# Patient Record
Sex: Female | Born: 1995 | Race: White | Hispanic: No | Marital: Married | Smoking: Never smoker
Health system: Southern US, Community
[De-identification: ages and names within clinical notes are randomized; demographics above are authoritative.]

## PROBLEM LIST (undated history)

## (undated) ENCOUNTER — Inpatient Hospital Stay: Payer: Self-pay

## (undated) DIAGNOSIS — F419 Anxiety disorder, unspecified: Secondary | ICD-10-CM

## (undated) DIAGNOSIS — R011 Cardiac murmur, unspecified: Secondary | ICD-10-CM

## (undated) DIAGNOSIS — K219 Gastro-esophageal reflux disease without esophagitis: Secondary | ICD-10-CM

## (undated) DIAGNOSIS — J189 Pneumonia, unspecified organism: Secondary | ICD-10-CM

## (undated) DIAGNOSIS — J45909 Unspecified asthma, uncomplicated: Secondary | ICD-10-CM

## (undated) DIAGNOSIS — Z7251 High risk heterosexual behavior: Secondary | ICD-10-CM

## (undated) HISTORY — PX: MANDIBLE FRACTURE SURGERY: SHX706

## (undated) HISTORY — DX: Anxiety disorder, unspecified: F41.9

## (undated) HISTORY — DX: Unspecified asthma, uncomplicated: J45.909

## (undated) HISTORY — DX: Gastro-esophageal reflux disease without esophagitis: K21.9

## (undated) HISTORY — PX: MANDIBLE SURGERY: SHX707

## (undated) HISTORY — DX: High risk heterosexual behavior: Z72.51

## (undated) HISTORY — PX: WISDOM TOOTH EXTRACTION: SHX21

---
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
## 2017-01-29 ENCOUNTER — Encounter: Payer: Self-pay | Admitting: Certified Nurse Midwife

## 2017-01-29 ENCOUNTER — Ambulatory Visit: Payer: BLUE CROSS/BLUE SHIELD | Admitting: Certified Nurse Midwife

## 2017-01-29 VITALS — BP 112/72 | HR 71 | Ht 68.0 in | Wt 229.9 lb

## 2017-01-29 DIAGNOSIS — Z30011 Encounter for initial prescription of contraceptive pills: Secondary | ICD-10-CM

## 2017-01-29 LAB — POCT URINE PREGNANCY: PREG TEST UR: NEGATIVE

## 2017-01-29 MED ORDER — NORETHIN-ETH ESTRAD-FE BIPHAS 1 MG-10 MCG / 10 MCG PO TABS: 1.0000 | ORAL_TABLET | Freq: Every day | ORAL | 11 refills | Status: DC

## 2017-01-29 NOTE — Patient Instructions (Signed)

## 2017-01-29 NOTE — Progress Notes (Signed)
Subjective:    Ann Deleon is a 21 y.o. female who presents for contraception counseling. The patient has no complaints today. The patient is sexually active. Pertinent past medical history: none.  Menstrual History: OB History    No data available      Patient's last menstrual period was 01/07/2017 (exact date).    The following portions of the patient's history were reviewed and updated as appropriate: allergies, current medications, past family history, past medical history, past social history, past surgical history and problem list.  Review of Systems Constitutional: negative Eyes: negative Ears, nose, mouth, throat, and face: negative Respiratory: negative Cardiovascular: negative Gastrointestinal: negative Genitourinary:negative Integument/breast: negative Hematologic/lymphatic: negative Musculoskeletal:negative Neurological: negative   Objective:    No exam performed today, not indicated.   Assessment:    21 y.o., starting OCP (estrogen/progesterone), no contraindications.   Plan:  Reviewed all options for birth control. She is interested in starting the pill. She has taken it in the past.  All questions answered. Follow up as needed.  Sample of lo loestrin given, Instructions given, prescription placed.   Ann BurkeAnnie Giavonni Deleon, CNM

## 2017-03-17 ENCOUNTER — Ambulatory Visit: Payer: BLUE CROSS/BLUE SHIELD | Admitting: Family Medicine

## 2017-03-17 ENCOUNTER — Encounter: Payer: Self-pay | Admitting: Family Medicine

## 2017-03-17 VITALS — BP 138/87 | HR 79 | Temp 98.3°F | Resp 16 | Ht 68.0 in | Wt 236.0 lb

## 2017-03-17 DIAGNOSIS — N3001 Acute cystitis with hematuria: Secondary | ICD-10-CM | POA: Diagnosis not present

## 2017-03-17 LAB — POCT URINALYSIS DIPSTICK
BILIRUBIN UA: NEGATIVE
GLUCOSE UA: NEGATIVE
KETONES UA: NEGATIVE
Nitrite, UA: NEGATIVE
Protein, UA: NEGATIVE
SPEC GRAV UA: 1.01 (ref 1.010–1.025)
Urobilinogen, UA: 0.2 E.U./dL
pH, UA: 7 (ref 5.0–8.0)

## 2017-03-17 MED ORDER — CEPHALEXIN 500 MG PO CAPS: 500.0000 mg | ORAL_CAPSULE | Freq: Three times a day (TID) | ORAL | 0 refills | Status: DC

## 2017-03-17 NOTE — Progress Notes (Signed)
Subjective:    Patient ID: Ann Deleon, female    DOB: May 27, 1995, 22 y.o.   MRN: [id number]  Ann Deleon is a 22 y.o. female presenting on 03/17/2017 for Urinary Tract Infection (onset 3 days )  Patient presents for a same day appointment.  HPI   UTI Reports symptoms started 3 days ago with initial lower pelvic discomfort and pain with associated urinary frequency and urge to go but reduced urination amount. Some burning but mostly discomfort pelvic discomfort. Last UTI 1-2 years ago, has had prior BV in past, treated with Flagyl Last menstrual cycle about 2-3 weeks ago, earlier in month. She just started new OCP per [facility] last seen on 01/29/17. She is sexually active with boyfriend. She denies any exposure to STD and declines screening. She has not missed her period and declines urine pregnancy testing today. Admits some loose stool Admits mild vaginal discharge Denies any fevers/chills, sweats, nausea vomiting, back or flank pain  Depression screen [facility] 2/9 12/16/2016 06/15/2016 07/19/2014  Decreased Interest 0 0 0  Down, Depressed, Hopeless 0 0 0  PHQ - 2 Score 0 0 0  Altered sleeping 0 2 -  Tired, decreased energy 0 2 -  Change in appetite 0 2 -  Feeling bad or failure about yourself  0 0 -  Trouble concentrating 0 - -  Moving slowly or fidgety/restless 0 2 -  Suicidal thoughts 0 0 -  PHQ-9 Score 0 8 -  Difficult doing work/chores Not difficult at all - -    Social History   Tobacco Use  . Smoking status: Never Smoker  . Smokeless tobacco: Never Used  Substance Use Topics  . Alcohol use: No  . Drug use: No    Review of Systems Per HPI unless specifically indicated above     Objective:    BP 138/87   Pulse 79   Temp 98.3 F (36.8 C) (Oral)   Resp 16   Ht 5\' 8"  (1.727 m)   Wt 236 lb (107 kg)   BMI 35.88 kg/m   Wt Readings from Last 3 Encounters:  03/17/17 236 lb (107 kg)  01/29/17 229 lb 14.4 oz (104.3 kg) (99 %, Z= 2.32)*  12/16/16 225 lb  (102.1 kg) (99 %, Z= 2.27)*   * Growth percentiles are based on CDC (Girls, 2-20 Years) data.    Physical Exam  Constitutional: She is oriented to person, place, and time. She appears well-developed and well-nourished. No distress.  Well-appearing, comfortable, cooperative  HENT:  Head: Normocephalic and atraumatic.  Mouth/Throat: Oropharynx is clear and moist.  Eyes: Conjunctivae are normal. Right eye exhibits no discharge. Left eye exhibits no discharge.  Cardiovascular: Normal rate.  Pulmonary/Chest: Effort normal.  Abdominal: Soft. Bowel sounds are normal. She exhibits no distension and no mass. There is tenderness (mild lower midline abdominal / pelvic discomfort on palpation).  Musculoskeletal: She exhibits no edema.  Neurological: She is alert and oriented to person, place, and time.  Skin: Skin is warm and dry. No rash noted. She is not diaphoretic. No erythema.  Psychiatric: She has a normal mood and affect. Her behavior is normal.  Well groomed, good eye contact, normal speech and thoughts  Nursing note and vitals reviewed.  Results for orders placed or performed in visit on 03/17/17  POCT Urinalysis Dipstick  Result Value Ref Range   Color, UA amber    Clarity, UA clear    Glucose, UA negative    Bilirubin, UA  negative    Ketones, UA negative    Spec Grav, UA 1.010 1.010 - 1.025   Blood, UA large    pH, UA 7.0 5.0 - 8.0   Protein, UA negative    Urobilinogen, UA 0.2 0.2 or 1.0 E.U./dL   Nitrite, UA negative    Leukocytes, UA Trace (A) Negative   Appearance clear    Odor none       Assessment & Plan:   Problem List Items Addressed This Visit    None    Visit Diagnoses    Acute cystitis with hematuria    -  Primary  Clinically consistent with UTI and supported by UA. No recent UTIs or abx courses. Prior UTI similar. She has no history of nephrolithiasis and this clinically doesn't seem consistent. No concern for pyelo today (no systemic symptoms, neg fever,  back pain, n/v). Considered other options with pelvic etiology - new OCP start >1-2 months ago, could be menstrual related, has not missed period, offered Upreg but patient declined.  Plan: 1. Reviewed UA 2. Ordered Urine culture 3. Empiric Keflex 500mg  TID x 7 days 4. Improve PO hydration 5. RTC if no improvement 1-2 weeks, red flags given to return sooner - next should follow-up with GYN if not improved as advised     Relevant Medications   cephALEXin (KEFLEX) 500 MG capsule   Other Relevant Orders   POCT Urinalysis Dipstick (Completed)   CULTURE, URINE COMPREHENSIVE      Meds ordered this encounter  Medications  . cephALEXin (KEFLEX) 500 MG capsule    Sig: Take 1 capsule (500 mg total) by mouth 3 (three) times daily. For 7 days    Dispense:  21 capsule    Refill:  0    Follow up plan: Return if symptoms worsen or fail to improve, for UTI.  Saralyn PilarAlexander Depaul Arizpe, DO [facility] 03/17/2017, 9:56 AM

## 2017-03-17 NOTE — Patient Instructions (Addendum)
Thank you for coming to the office today.  1. You have a Urinary Tract Infection - this is very common, your symptoms are reassuring and you should get better within 1 week on the antibiotics - Start Keflex 500mg  3 times daily for next 7 days, complete entire course, even if feeling better - We sent urine for a culture, we will call you within next few days if we need to change antibiotics - Please drink plenty of fluids, improve hydration over next 1 week  If symptoms worsening, developing nausea / vomiting, worsening back pain, fevers / chills / sweats, then please return for re-evaluation sooner.  To prevent bladder and kidney infections...  1. Pee within 30 minutes after sex  2. Wipe front to back after using the restroom  3. Drink enough water to keep your pee clear to pale yellow  If you think you are getting another bladder infection, start drinking cranberry juice and come see us so we can check the urine.  If not improved can contact [facility] for next step, may need pelvic exam and swab testing - also could be related to birth control, but seems less likely  Please schedule a Follow-up Appointment to: Return if symptoms worsen or fail to improve, for UTI.   If you have any other questions or concerns, please feel free to call the office or send a message through MyChart. You may also schedule an earlier appointment if necessary.  Additionally, you may be receiving a survey about your experience at our office within a few days to 1 week by e-mail or mail. We value your feedback.  Saralyn PilarAlexander Karamalegos, DO [facility]

## 2017-03-18 LAB — CULTURE, URINE COMPREHENSIVE
MICRO NUMBER:: 90129420
SPECIMEN QUALITY: ADEQUATE

## 2017-03-22 ENCOUNTER — Encounter: Payer: Self-pay | Admitting: *Deleted

## 2017-03-22 ENCOUNTER — Ambulatory Visit
Admission: EM | Admit: 2017-03-22 | Discharge: 2017-03-22 | Disposition: A | Discharge status: Home / Self Care | Payer: BLUE CROSS/BLUE SHIELD | Attending: Family Medicine | Admitting: Family Medicine

## 2017-03-22 DIAGNOSIS — N3000 Acute cystitis without hematuria: Secondary | ICD-10-CM

## 2017-03-22 LAB — URINALYSIS, COMPLETE (UACMP) WITH MICROSCOPIC
BILIRUBIN URINE: NEGATIVE
Glucose, UA: NEGATIVE mg/dL
Ketones, ur: NEGATIVE mg/dL
Nitrite: NEGATIVE
PROTEIN: NEGATIVE mg/dL
SPECIFIC GRAVITY, URINE: 1.01 (ref 1.005–1.030)
pH: 6.5 (ref 5.0–8.0)

## 2017-03-22 MED ORDER — NITROFURANTOIN MONOHYD MACRO 100 MG PO CAPS
100.0000 mg | ORAL_CAPSULE | Freq: Two times a day (BID) | ORAL | 0 refills | Status: DC
Start: 2017-03-22 — End: 2017-10-27

## 2017-03-22 NOTE — ED Provider Notes (Signed)
[facility]    CSN: [id number] Arrival date & time: 03/22/17  0847     History   Chief Complaint Chief Complaint  Patient presents with  . Flank Pain  . Dysuria    HPI Ann Deleon is a 22 y.o. female.   The history is provided by the patient.  Flank Pain  Pertinent negatives include no abdominal pain.  Dysuria  Pain quality:  Burning Pain severity:  Moderate Onset quality:  Sudden Duration:  1 week Timing:  Constant Progression:  Worsening Chronicity:  New Recent urinary tract infections: yes   Ineffective treatments:  Antibiotics (seen last week and prescribed keflex bid; not improving) Associated symptoms: nausea and vomiting (once this morning)   Associated symptoms: no abdominal pain, no fever, no flank pain, no genital lesions and no vaginal discharge   Associated symptoms comment:  Low back pain Risk factors: recurrent urinary tract infections   Risk factors: no hx of pyelonephritis, no hx of urolithiasis, no kidney transplant, not pregnant, no renal cysts, no renal disease, not sexually active, no sexually transmitted infections, no single kidney and no urinary catheter     Past Medical History:  Diagnosis Date  . Anxiety   . Asthma   . GERD (gastroesophageal reflux disease)   . Sexually active at young age     Patient Active Problem List   Diagnosis Date Noted  . GAD (generalized anxiety disorder) 12/16/2016  . Obesity (BMI 30.0-34.9) 12/16/2016  . Contraception management 06/15/2016  . Mild episode of recurrent major depressive disorder (HCC) 06/15/2016  . Concussion with no loss of consciousness 05/05/2016  . Contusion of other part of head 05/05/2016  . 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 Headache 01/14/2016  . Fatigue 11/19/2015  . Allergic rhinitis due to pollen 04/12/2015  . Asthma, mild persistent 07/06/2014  . Dermatitis, eczematoid 07/06/2014  . Dysmenorrhea 07/06/2014  . Acid reflux 07/06/2014  . Anxiety 07/06/2014     Past Surgical History:  Procedure Laterality Date  . MANDIBLE SURGERY      OB History    Gravida Para Term Preterm AB Living   0 0 0 0 0 0   SAB TAB Ectopic Multiple Live Births   0 0 0 0 0       Home Medications    Prior to Admission medications   Medication Sig Start Date End Date Taking? Authorizing Provider  cephALEXin (KEFLEX) 500 MG capsule Take 1 capsule (500 mg total) by mouth 3 (three) times daily. For 7 days 03/17/17  Yes Karamalegos, Netta Neat, DO  Norethindrone-Ethinyl Estradiol-Fe Biphas (LO LOESTRIN FE) 1 MG-10 MCG / 10 MCG tablet Take 1 tablet by mouth daily. 01/29/17  Yes Doreene Burke, CNM  albuterol (PROVENTIL HFA;VENTOLIN HFA) 108 (90 BASE) MCG/ACT inhaler Inhale 2 puffs into the lungs 4 (four) times daily as needed. 06/05/13   Loura Pardon, NP  albuterol (PROVENTIL) (2.5 MG/3ML) 0.083% nebulizer solution Take 3 mLs (2.5 mg total) by nebulization every 6 (six) hours as needed for wheezing or shortness of breath. 02/28/16   Karamalegos, Netta Neat, DO  escitalopram (LEXAPRO) 10 MG tablet Take 1 tablet (10 mg total) by mouth daily. 12/16/16   Karamalegos, Alexander J, DO  fluticasone (FLOVENT HFA) 110 MCG/ACT inhaler Inhale 1 puff into the lungs 2 (two) times daily. 05/28/15   Krebs, Laurel Dimmer, NP  nitrofurantoin, macrocrystal-monohydrate, (MACROBID) 100 MG capsule Take 1 capsule (100 mg total) by mouth 2 (two) times daily. 03/22/17   Giovannie Scerbo,  Pamala Hurry, MD  Spacer/Aero-Holding Chambers (AEROCHAMBER PLUS WITH MASK) inhaler Use as instructed 10/29/15   Smitty Cords, DO    Family History Family History  Problem Relation Age of Onset  . Depression Mother   . Anxiety disorder Mother   . Diabetes Father   . Nephrolithiasis Father   . Depression Father     Social History Social History   Tobacco Use  . Smoking status: Never Smoker  . Smokeless tobacco: Never Used  Substance Use Topics  . Alcohol use: No  . Drug use: No     Allergies    Patient has no known allergies.   Review of Systems Review of Systems  Constitutional: Negative for fever.  Gastrointestinal: Positive for nausea and vomiting (once this morning). Negative for abdominal pain.  Genitourinary: Positive for dysuria. Negative for flank pain and vaginal discharge.     Physical Exam Triage Vital Signs ED Triage Vitals  Enc Vitals Group     BP 03/22/17 0904 (!) 148/91     Pulse Rate 03/22/17 0904 97     Resp 03/22/17 0904 16     Temp 03/22/17 0904 98.3 F (36.8 C)     Temp Source 03/22/17 0904 Oral     SpO2 03/22/17 0904 98 %     Weight 03/22/17 0906 240 lb (108.9 kg)     Height 03/22/17 0906 5\' 9"  (1.753 m)     Head Circumference --      Peak Flow --      Pain Score --      Pain Loc --      Pain Edu? --      Excl. in GC? --    No data found.  Updated Vital Signs BP (!) 148/91 (BP Location: Left Arm)   Pulse 97   Temp 98.3 F (36.8 C) (Oral)   Resp 16   Ht 5\' 9"  (1.753 m)   Wt 240 lb (108.9 kg)   SpO2 98%   BMI 35.44 kg/m   Visual Acuity Right Eye Distance:   Left Eye Distance:   Bilateral Distance:    Right Eye Near:   Left Eye Near:    Bilateral Near:     Physical Exam  Constitutional: She appears well-developed and well-nourished. No distress.  Abdominal: Soft. Bowel sounds are normal. She exhibits no distension and no mass. There is tenderness (mild, suprapubic). There is no rebound and no guarding.  Skin: She is not diaphoretic.  Nursing note and vitals reviewed.    UC Treatments / Results  Labs (all labs ordered are listed, but only abnormal results are displayed) Labs Reviewed  URINALYSIS, COMPLETE (UACMP) WITH MICROSCOPIC - Abnormal; Notable for the following components:      Result Value   Color, Urine STRAW (*)    APPearance HAZY (*)    Hgb urine dipstick MODERATE (*)    Leukocytes, UA LARGE (*)    Squamous Epithelial / LPF 6-30 (*)    Bacteria, UA RARE (*)    All other components within normal limits   URINE CULTURE    EKG  EKG Interpretation None       Radiology No results found.  Procedures Procedures (including critical care time)  Medications Ordered in UC Medications - No data to display   Initial Impression / Assessment and Plan / UC Course  I have reviewed the triage vital signs and the nursing notes.  Pertinent labs & imaging results that were available during my care of  the patient were reviewed by me and considered in my medical decision making (see chart for details).       Final Clinical Impressions(s) / UC Diagnoses   Final diagnoses:  Acute cystitis without hematuria    ED Discharge Orders        Ordered    nitrofurantoin, macrocrystal-monohydrate, (MACROBID) 100 MG capsule  2 times daily     03/22/17 1006     1. Lab results and diagnosis reviewed with patient 2. rx as per orders above; reviewed possible side effects, interactions, risks and benefits  3. Recommend supportive treatment with fluids, otc analgesics prn 4. Follow-up prn if symptoms worsen or don't improve  Controlled Substance Prescriptions [place] Controlled Substance Registry consulted? Not Applicable   Payton Mccallumonty, Marquiz Sotelo, MD 03/22/17 1014

## 2017-03-22 NOTE — ED Triage Notes (Signed)
Seen last week in [place] for UTI symptoms and rx Cephalexin 500mg , has 2 days left on that rx. Here today with bilat flank pain, N/V, and continuing UTI symptoms.

## 2017-03-23 LAB — URINE CULTURE
CULTURE: NO GROWTH
SPECIAL REQUESTS: NORMAL

## 2017-10-27 ENCOUNTER — Ambulatory Visit (INDEPENDENT_AMBULATORY_CARE_PROVIDER_SITE_OTHER): Payer: BLUE CROSS/BLUE SHIELD | Admitting: Nurse Practitioner

## 2017-10-27 ENCOUNTER — Encounter: Payer: Self-pay | Admitting: Nurse Practitioner

## 2017-10-27 ENCOUNTER — Other Ambulatory Visit: Payer: Self-pay

## 2017-10-27 VITALS — BP 124/76 | HR 79 | Temp 98.5°F | Ht 69.0 in | Wt 236.0 lb

## 2017-10-27 DIAGNOSIS — M545 Low back pain, unspecified: Secondary | ICD-10-CM

## 2017-10-27 DIAGNOSIS — G43A1 Cyclical vomiting, intractable: Secondary | ICD-10-CM

## 2017-10-27 DIAGNOSIS — R1115 Cyclical vomiting syndrome unrelated to migraine: Secondary | ICD-10-CM

## 2017-10-27 DIAGNOSIS — N3001 Acute cystitis with hematuria: Secondary | ICD-10-CM | POA: Diagnosis not present

## 2017-10-27 LAB — POCT URINALYSIS DIPSTICK
Bilirubin, UA: NEGATIVE
Glucose, UA: NEGATIVE
Ketones, UA: NEGATIVE
Nitrite, UA: NEGATIVE
Protein, UA: NEGATIVE
Spec Grav, UA: 1.01 (ref 1.010–1.025)
Urobilinogen, UA: 0.2 E.U./dL
pH, UA: 5 (ref 5.0–8.0)

## 2017-10-27 MED ORDER — CEPHALEXIN 500 MG PO CAPS: 500.0000 mg | ORAL_CAPSULE | Freq: Three times a day (TID) | ORAL | 0 refills | Status: AC

## 2017-10-27 MED ORDER — ONDANSETRON HCL 4 MG PO TABS: 4.0000 mg | ORAL_TABLET | Freq: Three times a day (TID) | ORAL | 0 refills | Status: DC | PRN

## 2017-10-27 NOTE — Progress Notes (Signed)
Subjective:    Patient ID: Ann Deleon, female    DOB: May 31, 1995, 22 y.o.   MRN: [id number]  Ann Deleon is a 22 y.o. female presenting on 10/27/2017 for Nausea (vomiting, diarrhea, throbbing low back pain that radiates around the stomach x 12 hrs. Pt takingf Pepto Bismol, Dramamine, and tylenol  )   HPI  Nausea and Vomiting Onset of symptoms of nausea and vomiting began 1 day ago.  Patient was at work and started having R sided back pain.  Has eaten [employer] yesterday, which has made her sick before. Has had [employer] since getting sick before without problems.  She didn't feel well (nausea) after eating and vomited at work around 3 pm.  Has been vomiting about every 1 hour since then.   Yesterday, she took 2 Tylenol Extra Strength with some relief of back pain.  Relaxed some in a hot water bath.  Ate soup, but vomited. She was able to sleep, but wakes every 1 hour and has continued having increased back pain.  Took 2 more Tylenol and pepto bismol this morning without relief. - Stomach contents and liquids only last few times she has vomited.  Cannot keep down drinks or food.  Has kept about 3 oz water down in clinic today. - Denies fever, sweats, diarrhea.  Is having some chills.  - Denies pelvic pressure, increased urination, or dysuria.  Social History   Tobacco Use  . Smoking status: Never Smoker  . Smokeless tobacco: Never Used  Substance Use Topics  . Alcohol use: No  . Drug use: No    Review of Systems Per HPI unless specifically indicated above     Objective:    BP 124/76 (BP Location: Right Arm, Patient Position: Sitting, Cuff Size: Normal)   Pulse 79   Temp 98.5 F (36.9 C) (Oral)   Ht 5\' 9"  (1.753 m)   Wt 236 lb (107 kg)   LMP 10/13/2017   BMI 34.85 kg/m   Wt Readings from Last 3 Encounters:  10/27/17 236 lb (107 kg)  03/22/17 240 lb (108.9 kg)  03/17/17 236 lb (107 kg)    Physical Exam  Constitutional: She is oriented to person, place, and  time. Vital signs are normal. She appears well-developed and well-nourished. She is active. She has a sickly appearance. No distress.  HENT:  Head: Normocephalic and atraumatic.  Cardiovascular: Normal rate, regular rhythm, normal heart sounds and intact distal pulses.  Pulmonary/Chest: Effort normal and breath sounds normal. No respiratory distress.  Abdominal: Soft. Normal appearance and bowel sounds are normal. There is no hepatosplenomegaly. There is generalized tenderness. There is CVA tenderness. There is no rigidity, no rebound, no guarding, no tenderness at McBurney's point and negative Murphy's sign. No hernia.  Lymphadenopathy:    She has no cervical adenopathy.    She has no axillary adenopathy.       Right: No inguinal adenopathy present.       Left: No inguinal adenopathy present.  Neurological: She is alert and oriented to person, place, and time.  Skin: Skin is warm and dry. Capillary refill takes less than 2 seconds.  Psychiatric: She has a normal mood and affect. Her behavior is normal. Judgment and thought content normal.  Vitals reviewed.  Results for orders placed or performed in visit on 10/27/17  POCT urinalysis dipstick  Result Value Ref Range   Color, UA yellow    Clarity, UA clear    Glucose, UA Negative Negative  Bilirubin, UA negative    Ketones, UA negative    Spec Grav, UA 1.010 1.010 - 1.025   Blood, UA large    pH, UA 5.0 5.0 - 8.0   Protein, UA Negative Negative   Urobilinogen, UA 0.2 0.2 or 1.0 E.U./dL   Nitrite, UA negative    Leukocytes, UA Trace (A) Negative   Appearance     Odor        Assessment & Plan:   Problem List Items Addressed This Visit    None    Visit Diagnoses    Acute right-sided low back pain without sciatica    -  Primary   Relevant Orders   POCT urinalysis dipstick (Completed)   Intractable cyclical vomiting with nausea       Relevant Medications   ondansetron (ZOFRAN) 4 MG tablet   Other Relevant Orders   CBC with  Differential/Platelet   COMPLETE METABOLIC PANEL WITH GFR   Acute cystitis with hematuria       Relevant Medications   cephALEXin (KEFLEX) 500 MG capsule   Other Relevant Orders   Urine Culture      Acute infectious process, likely viral gastroenteritis vs pyelonephritis in presence of acute cystitis.  Exam today with leuks and blood on UA with pos L sided CVAT.   - Patient is x 18 hr symptoms diarrhea / vomiting. No known sick contacts. - Currently fatigued, but well appearing and non-toxic, clinically well hydrated on exam, benign abdomen.  Plan: 1. Reassurance, likely self-limited 7-10 days 2. Continue PO as tolerated. May try to increase hydration, clear fluids (gatorade, pedialyate, water) 3. Continue Tylenol. 4. Precaution given rx Zofran 4mg  ODT q 8 hr PRN 5. For acute cystitis - start Keflex 500 mg tid x 5 days.  Send urine culture 6. Labs today 7.Return criteria reviewed.  Meds ordered this encounter  Medications  . ondansetron (ZOFRAN) 4 MG tablet    Sig: Take 1 tablet (4 mg total) by mouth every 8 (eight) hours as needed for nausea or vomiting.    Dispense:  15 tablet    Refill:  0    Order Specific Question:   Supervising Provider    Answer:   Smitty Cords [[id number]]  . cephALEXin (KEFLEX) 500 MG capsule    Sig: Take 1 capsule (500 mg total) by mouth 3 (three) times daily for 5 days.    Dispense:  15 capsule    Refill:  0    Order Specific Question:   Supervising Provider    Answer:   Smitty Cords [[id number]]    Follow up plan: Return 2-3 days if symptoms worsen or fail to improve.  Wilhelmina Mcardle, DNP, AGPCNP-BC Adult Gerontology Primary Care Nurse Practitioner [facility] 10/27/2017, 10:15 AM

## 2017-10-27 NOTE — Patient Instructions (Addendum)
[address] [place],   Thank you for coming in to clinic today.  1. You most likely have a food related illness that is viral and will last up to 24-48 hours.  Your symptoms are likely from Viral Gastroenteritis. This should significantly improve within about 24-48 hours and full improvement in about 1 week. - It is important to continue hydration.  Drink at least 2 L water daily.  You may start drinking some gatorade or powerade if you are unable to eat enough food. - Given rx for Zofran 4 mg dissolving tablets/liquid - may use every 8 hours as needed for vomiting only if persistent - Continue Tylenol every 6 hours if needed for fevers or aches and pains.  If symptoms are worsening, decreased urine output (no urination within 12 hours), persistent fevers higher than 101F for more than 3 days, worsening or increased vomiting, persistent diarrhea after 5-7 days, decreased appetite, please call or return to clinic, may go to Urgent Care or ED.  2. If you have a urine infection we will also call over an antibiotic to your pharmacy.   Please schedule a follow-up appointment with Wilhelmina Mcardle, AGNP. Return 2-3 days if symptoms worsen or fail to improve.  If you have any other questions or concerns, please feel free to call the clinic or send a message through MyChart. You may also schedule an earlier appointment if necessary.  You will receive a survey after today's visit either digitally by e-mail or paper by [employer]. Your experiences and feedback matter to [place].  Please respond so we know how we are doing as we provide care for you.   Wilhelmina Mcardle, DNP, AGNP-BC Adult Gerontology Nurse Practitioner [facility], [facility]

## 2017-10-28 LAB — URINE CULTURE
MICRO NUMBER:: 91090391
Result:: NO GROWTH
SPECIMEN QUALITY:: ADEQUATE

## 2017-10-28 LAB — CBC WITH DIFFERENTIAL/PLATELET
Basophils Absolute: 47 cells/uL (ref 0–200)
Basophils Relative: 0.4 %
Eosinophils Absolute: 12 cells/uL — ABNORMAL LOW (ref 15–500)
Eosinophils Relative: 0.1 %
HCT: 40.7 % (ref 35.0–45.0)
Hemoglobin: 13.5 g/dL (ref 11.7–15.5)
Lymphs Abs: 1147 cells/uL (ref 850–3900)
MCH: 28.7 pg (ref 27.0–33.0)
MCHC: 33.2 g/dL (ref 32.0–36.0)
MCV: 86.6 fL (ref 80.0–100.0)
MPV: 12 fL (ref 7.5–12.5)
Monocytes Relative: 7.7 %
Neutro Abs: 9594 cells/uL — ABNORMAL HIGH (ref 1500–7800)
Neutrophils Relative %: 82 %
Platelets: 288 10*3/uL (ref 140–400)
RBC: 4.7 10*6/uL (ref 3.80–5.10)
RDW: 11.7 % (ref 11.0–15.0)
Total Lymphocyte: 9.8 %
WBC mixed population: 901 cells/uL (ref 200–950)
WBC: 11.7 10*3/uL — ABNORMAL HIGH (ref 3.8–10.8)

## 2017-10-28 LAB — COMPLETE METABOLIC PANEL WITH GFR
AG Ratio: 1.4 (calc) (ref 1.0–2.5)
ALT: 10 U/L (ref 6–29)
AST: 21 U/L (ref 10–30)
Albumin: 3.9 g/dL (ref 3.6–5.1)
Alkaline phosphatase (APISO): 82 U/L (ref 33–115)
BUN/Creatinine Ratio: 7 (calc) (ref 6–22)
BUN: 16 mg/dL (ref 7–25)
CO2: 25 mmol/L (ref 20–32)
Calcium: 9.4 mg/dL (ref 8.6–10.2)
Chloride: 108 mmol/L (ref 98–110)
Creat: 2.19 mg/dL — ABNORMAL HIGH (ref 0.50–1.10)
GFR, Est African American: 36 mL/min/{1.73_m2} — ABNORMAL LOW (ref >=60)
GFR, Est Non African American: 31 mL/min/{1.73_m2} — ABNORMAL LOW (ref >=60)
Globulin: 2.8 g/dL (calc) (ref 1.9–3.7)
Glucose, Bld: 84 mg/dL (ref 65–99)
Potassium: 4.3 mmol/L (ref 3.5–5.3)
Sodium: 141 mmol/L (ref 135–146)
Total Bilirubin: 0.4 mg/dL (ref 0.2–1.2)
Total Protein: 6.7 g/dL (ref 6.1–8.1)

## 2018-01-06 DIAGNOSIS — B001 Herpesviral vesicular dermatitis: Secondary | ICD-10-CM

## 2018-01-07 ENCOUNTER — Telehealth: Payer: Self-pay | Admitting: Family Medicine

## 2018-01-07 DIAGNOSIS — B001 Herpesviral vesicular dermatitis: Secondary | ICD-10-CM

## 2018-01-07 MED ORDER — VALACYCLOVIR HCL 1 G PO TABS: 1000.0000 mg | ORAL_TABLET | Freq: Two times a day (BID) | ORAL | 1 refills | Status: DC | PRN

## 2018-01-07 MED ORDER — ACYCLOVIR 5 % EX OINT: TOPICAL_OINTMENT | CUTANEOUS | 0 refills | Status: DC

## 2018-01-07 NOTE — Telephone Encounter (Signed)
.  mychart

## 2018-01-07 NOTE — Telephone Encounter (Signed)
Pt has fever blisters and asked to have a refill valacyclovir sent to [facility].  Her call back [phone]

## 2018-01-07 NOTE — Telephone Encounter (Signed)
Incoming call

## 2018-01-07 NOTE — Addendum Note (Signed)
Addended by: Smitty CordsKARAMALEGOS, Elvenia Godden J on: 01/07/2018 05:37 PM   Modules accepted: Orders

## 2018-01-07 NOTE — Telephone Encounter (Signed)
Mychart message

## 2018-01-07 NOTE — Telephone Encounter (Signed)
Previous rx was from 2017-2018. Re ordered now, Valtrex take 1 tab twice a day for 5-7 days as needed for flare.  Please notify patient that she will be required to come in for a Yearly Physical / Check up in next 3-6 months. Needs to be seen in 2020 for a routine checkup, or we will be unable to refill rx in the future.  Saralyn PilarAlexander Antawan Mchugh, DO [facility] 01/07/2018, 12:49 PM

## 2018-01-10 NOTE — Telephone Encounter (Signed)
Patient advised.

## 2018-04-11 ENCOUNTER — Other Ambulatory Visit: Payer: Self-pay

## 2018-04-11 ENCOUNTER — Telehealth: Payer: Self-pay | Admitting: Certified Nurse Midwife

## 2018-04-11 MED ORDER — NORETHIN-ETH ESTRAD-FE BIPHAS 1 MG-10 MCG / 10 MCG PO TABS: 1.0000 | ORAL_TABLET | Freq: Every day | ORAL | 0 refills | Status: DC

## 2018-04-11 NOTE — Telephone Encounter (Signed)
1 refill sent. Pt needs appt.

## 2018-04-11 NOTE — Telephone Encounter (Signed)
The patient called and stated that she needs a refill of her birth control prescription. The patient did not disclose any other information. Please advise.

## 2018-05-12 ENCOUNTER — Telehealth: Payer: Self-pay | Admitting: Certified Nurse Midwife

## 2018-05-12 NOTE — Telephone Encounter (Signed)
The patient called wanting to speak with a nurse in regards to her birth control prescription. Please advise.

## 2018-05-13 ENCOUNTER — Encounter: Payer: Self-pay | Admitting: Family Medicine

## 2018-05-13 ENCOUNTER — Ambulatory Visit: Payer: BLUE CROSS/BLUE SHIELD | Admitting: Family Medicine

## 2018-05-13 ENCOUNTER — Telehealth: Payer: Self-pay

## 2018-05-13 ENCOUNTER — Other Ambulatory Visit: Payer: Self-pay

## 2018-05-13 VITALS — BP 126/84 | HR 83 | Temp 99.2°F | Resp 16 | Ht 69.0 in | Wt 237.6 lb

## 2018-05-13 DIAGNOSIS — F3342 Major depressive disorder, recurrent, in full remission: Secondary | ICD-10-CM | POA: Diagnosis not present

## 2018-05-13 DIAGNOSIS — H8112 Benign paroxysmal vertigo, left ear: Secondary | ICD-10-CM

## 2018-05-13 DIAGNOSIS — Z87820 Personal history of traumatic brain injury: Secondary | ICD-10-CM

## 2018-05-13 NOTE — Progress Notes (Addendum)
Subjective:    Patient ID: Ann Deleon, female    DOB: 11/06/1995, 23 y.o.   MRN: [id number]  Ann Deleon is a 23 y.o. female presenting on 05/13/2018 for Ear Pain (slurred speech, Headache, Dizzy onset 5 days ) and Dizziness  Patient presents for a same day appointment.  HPI   Persistent Dizziness / Suspected Vertigo / Possible Post-concussive symptoms - Reports symptoms started about 5 days ago woke up not feeling, felt she was dizzy and did not feel "right", describes dizziness as more of a persistent "vertigo" or "movement" swimmy headed, and she has difficulty focusing now it usually takes a minute to process, often forgetful in past few days, she cannot recall exact things - Followed by [facility] Dr Jenne CampusMcQueen in past for vertigo, they did maneuver to reset her vertigo and also gave medicine not sure which one. She called them today and left a message, still waiting on call back, she already tried Meclizine 2 days ago without any results it made her dizziness worse - She has history of vertigo in past twice before years ago - She has significant history in past with history of concussion in 04/2016, about 2 years ago from head injury, she had significant headaches and post concussive symptoms that gradually improved did not require any further neurology consult or other specialty testing Denies nausea vomiting, fever chills, sweats, congestion cough body ache, numbness tingling weakness, near syncope, chest pain  Major Depression, recurrent, in remission Not focus of visit today. She denies any significant mood symptoms or depression at this time.  Health Maintenance: Did not get flu shot this season.  [facility] 05/13/2018 12/16/2016 06/15/2016  Decreased Interest 0 0 0  Down, Depressed, Hopeless 1 0 0  PHQ - 2 Score 1 0 0  Altered sleeping 1 0 2  Tired, decreased energy 1 0 2  Change in appetite 0 0 2  Feeling bad or failure about yourself  0 0 0  Trouble  concentrating 0 0 -  Moving slowly or fidgety/restless 0 0 2  Suicidal thoughts 0 0 0  PHQ-9 Score 3 0 8  Difficult doing work/chores Not difficult at all Not difficult at all -    Social History   Tobacco Use  . Smoking status: Never Smoker  . Smokeless tobacco: Never Used  Substance Use Topics  . Alcohol use: No  . Drug use: No    Review of Systems Per HPI unless specifically indicated above     Objective:    BP 126/84   Pulse 83   Temp 99.2 F (37.3 C) (Oral)   Resp 16   Ht 5\' 9"  (1.753 m)   Wt 237 lb 9.6 oz (107.8 kg)   BMI 35.09 kg/m   Wt Readings from Last 3 Encounters:  05/13/18 237 lb 9.6 oz (107.8 kg)  10/27/17 236 lb (107 kg)  03/22/17 240 lb (108.9 kg)    Physical Exam Vitals signs and nursing note reviewed.  Constitutional:      General: She is not in acute distress.    Appearance: She is well-developed. She is not diaphoretic.     Comments: Well-appearing, comfortable, cooperative  HENT:     Head: Normocephalic and atraumatic.     Comments: Frontal / maxillary sinuses non-tender. Nares patent without congestion with turbinate edema  Left TM with some mild clear fluid effusion without bulging or erythema R TM normal  Oropharynx clear without erythema, exudates, edema or asymmetry.  Dix-hallpike maneuver is mildly positive on LEFT side, without eye nystagmus, reproduced some vertigo symptoms, R side negative Eyes:     General:        Right eye: No discharge.        Left eye: No discharge.     Extraocular Movements: Extraocular movements intact.     Conjunctiva/sclera: Conjunctivae normal.     Pupils: Pupils are equal, round, and reactive to light.  Neck:     Musculoskeletal: Normal range of motion and neck supple.     Thyroid: No thyromegaly.  Cardiovascular:     Rate and Rhythm: Normal rate and regular rhythm.     Heart sounds: Normal heart sounds. No murmur.  Pulmonary:     Effort: Pulmonary effort is normal. No respiratory distress.      Breath sounds: Normal breath sounds. No wheezing or rales.  Musculoskeletal: Normal range of motion.  Lymphadenopathy:     Cervical: No cervical adenopathy.  Skin:    General: Skin is warm and dry.     Findings: No erythema or rash.  Neurological:     General: No focal deficit present.     Mental Status: She is alert and oriented to person, place, and time.     Cranial Nerves: No cranial nerve deficit.     Sensory: No sensory deficit.     Motor: No weakness.     Coordination: Coordination normal.     Comments: No slurred speech today on exam. Normal speech.  Psychiatric:        Behavior: Behavior normal.     Comments: Well groomed, good eye contact, normal speech and thoughts       Results for orders placed or performed in visit on 10/27/17  Urine Culture  Result Value Ref Range   MICRO NUMBER: [id number]    SPECIMEN QUALITY: ADEQUATE    Sample Source URINE    STATUS: FINAL    Result: No Growth   CBC with Differential/Platelet  Result Value Ref Range   WBC 11.7 (H) 3.8 - 10.8 Thousand/uL   RBC 4.70 3.80 - 5.10 Million/uL   Hemoglobin 13.5 11.7 - 15.5 g/dL   HCT 54.0 98.1 - 19.1 %   MCV 86.6 80.0 - 100.0 fL   MCH 28.7 27.0 - 33.0 pg   MCHC 33.2 32.0 - 36.0 g/dL   RDW 47.8 29.5 - 62.1 %   Platelets 288 140 - 400 Thousand/uL   MPV 12.0 7.5 - 12.5 fL   Neutro Abs 9,594 (H) 1,500 - 7,800 cells/uL   Lymphs Abs 1,147 850 - 3,900 cells/uL   WBC mixed population 901 200 - 950 cells/uL   Eosinophils Absolute 12 (L) 15 - 500 cells/uL   Basophils Absolute 47 0 - 200 cells/uL   Neutrophils Relative % 82 %   Total Lymphocyte 9.8 %   Monocytes Relative 7.7 %   Eosinophils Relative 0.1 %   Basophils Relative 0.4 %  COMPLETE METABOLIC PANEL WITH GFR  Result Value Ref Range   Glucose, Bld 84 65 - 99 mg/dL   BUN 16 7 - 25 mg/dL   Creat 3.08 (H) 6.57 - 1.10 mg/dL   GFR, Est Non African American 31 (L) > OR = 60 mL/min/1.32m2   GFR, Est African American 36 (L) > OR = 60  mL/min/1.33m2   BUN/Creatinine Ratio 7 6 - 22 (calc)   Sodium 141 135 - 146 mmol/L   Potassium 4.3 3.5 - 5.3 mmol/L   Chloride 108 98 -  110 mmol/L   CO2 25 20 - 32 mmol/L   Calcium 9.4 8.6 - 10.2 mg/dL   Total Protein 6.7 6.1 - 8.1 g/dL   Albumin 3.9 3.6 - 5.1 g/dL   Globulin 2.8 1.9 - 3.7 g/dL (calc)   AG Ratio 1.4 1.0 - 2.5 (calc)   Total Bilirubin 0.4 0.2 - 1.2 mg/dL   Alkaline phosphatase (APISO) 82 33 - 115 U/L   AST 21 10 - 30 U/L   ALT 10 6 - 29 U/L  POCT urinalysis dipstick  Result Value Ref Range   Color, UA yellow    Clarity, UA clear    Glucose, UA Negative Negative   Bilirubin, UA negative    Ketones, UA negative    Spec Grav, UA 1.010 1.010 - 1.025   Blood, UA large    pH, UA 5.0 5.0 - 8.0   Protein, UA Negative Negative   Urobilinogen, UA 0.2 0.2 or 1.0 E.U./dL   Nitrite, UA negative    Leukocytes, UA Trace (A) Negative   Appearance     Odor        Assessment & Plan:   Problem List Items Addressed This Visit    History of concussion Prior complex history of post concussive syndrome for several weeks in Spring 2018 after traumatic head injury softball, she had neurological and cognitive symptoms at that time - Gradually improved with concussion protocol and activity/sport restriction, but did not see neurology or specialist, she was referred but ultimately did not keep apt  REFERRAL today to [facility] for 2nd opinion on recurrence of some post concussive syndrome symptoms vs possible complex migraine headache as other etiology - requesting further diagnostic evaluation    Relevant Orders   Ambulatory referral to Neurology   Recurrent major depressive disorder, in full remission (HCC) Stable in remission No longer on SSRI Lexaxpro Doing well, this was not focus today, but it was updated based on her report     Other Visit Diagnoses    Vertigo, benign paroxysmal, left    -  Primary   Relevant Orders   Ambulatory referral to Neurology        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 Handout given with Epley maneuver TID for 1-2 weeks until resolved 2. STOP Meclizine if worsening dizziness, likely anti cholinergic side effect - Already sent referral to [facility] for 2nd opinion - She may return to ENT 3. Return criteria, if not improved consider vestibular PT referral  Strict return criteria given to patient if worsening headache, dizziness, neurological symptoms or new concerns - she needs to seek care immediately hospital ED vs urgent care, contact us for triage   No orders of the defined types were placed in this encounter.  Orders Placed This Encounter  Procedures  . Ambulatory referral to Neurology    Referral Priority:   Routine    Referral Type:   Consultation    Referral Reason:   Specialty Services Required    Requested Specialty:   Neurology    Number of Visits Requested:   1    Follow up plan: Return in about 3 months (around 08/13/2018) for DUE Routine Urine Test in office - Follow-up vertigo /  conussion history - neurology.   Saralyn Pilar, DO [facility] 05/13/2018, 3:11 PM

## 2018-05-13 NOTE — Patient Instructions (Addendum)
Thank you for coming to the office today.  1. You have symptoms of Vertigo (Benign Paroxysmal Positional Vertigo) - This is commonly caused by inner ear fluid imbalance, sometimes can be worsened by allergies and sinus symptoms, otherwise it can occur randomly sometimes and we may never discover the exact cause. - To treat this, try the Epley Manuever (see diagrams/instructions below) at home up to 3 times a day for 1-2 weeks or until symptoms resolve  If you develop significant worsening episode with vertigo that does not improve and you get severe headache, loss of vision, arm or leg weakness, slurred speech, or other concerning symptoms please seek immediate medical attention at Emergency Department.  Can try Debrox ear cleaning kit OTC if you want  Referral to [facility] - for post concussive syndrome possible vs vertigo  [facility] - Neurology Dept [place], [place] [address] Phone: [phone]   Please schedule a follow-up appointment with Dr Parks Ranger within 4 weeks if Vertigo not improving, and will consider Referral to Vestibular Rehab  See the next page for images describing the Epley Manuever.     ----------------------------------------------------------------------------------------------------------------------       Please schedule a Follow-up Appointment to: Return in about 3 months (around 08/13/2018) for DUE Routine Urine Test in office - Follow-up vertigo / conussion history - neurology.  If you have any other questions or concerns, please feel free to call the office or send a message through [place]. You may also schedule an earlier appointment if necessary.  Additionally, you may be receiving a survey about your experience at our office within a few days to 1 week by e-mail or mail. We value your feedback.  [name], DO [name]

## 2018-05-13 NOTE — Telephone Encounter (Signed)
See mychart messages

## 2018-05-13 NOTE — Telephone Encounter (Signed)
I was still unable to reach patient today after phone call attempts, voice mail and mychart message.  I called patient's emergency contact, Webb Silversmith, and discussed with her, she was aware of her symptoms for past 2-3 days, and said Hajer was likely at work currently. I advised her that I am concerned about her symptoms, since ongoing since 3/22. She needs to be seen in person today - or seen at hospital ED or Urgent Care, may need further care and testing possibly advanced imaging that we cannot provide.  Magda Paganini said that Primera thinks it is "vertigo" and she will try to reach her today at work to schedule with [place] or seek care.  Saralyn Pilar, DO [facility] 05/13/2018, 9:34 AM

## 2018-05-20 ENCOUNTER — Other Ambulatory Visit: Payer: Self-pay | Admitting: Certified Nurse Midwife

## 2018-05-26 ENCOUNTER — Other Ambulatory Visit: Payer: Self-pay | Admitting: *Deleted

## 2018-05-26 MED ORDER — NORETHIN-ETH ESTRAD-FE BIPHAS 1 MG-10 MCG / 10 MCG PO TABS: 1.0000 | ORAL_TABLET | Freq: Every day | ORAL | 0 refills | Status: DC

## 2018-05-30 DIAGNOSIS — H8110 Benign paroxysmal vertigo, unspecified ear: Secondary | ICD-10-CM | POA: Insufficient documentation

## 2018-05-30 DIAGNOSIS — F0781 Postconcussional syndrome: Secondary | ICD-10-CM | POA: Insufficient documentation

## 2018-06-14 ENCOUNTER — Other Ambulatory Visit: Payer: Self-pay | Admitting: Acute Care

## 2018-06-14 DIAGNOSIS — Z87820 Personal history of traumatic brain injury: Secondary | ICD-10-CM

## 2018-06-29 DIAGNOSIS — R42 Dizziness and giddiness: Secondary | ICD-10-CM | POA: Insufficient documentation

## 2018-07-26 ENCOUNTER — Ambulatory Visit
Admission: RE | Admit: 2018-07-26 | Discharge: 2018-07-26 | Disposition: A | Discharge status: Home / Self Care | Payer: BC Managed Care – PPO | Source: Ambulatory Visit | Attending: Acute Care | Admitting: Acute Care

## 2018-07-26 ENCOUNTER — Other Ambulatory Visit: Payer: Self-pay

## 2018-07-26 DIAGNOSIS — Z87820 Personal history of traumatic brain injury: Secondary | ICD-10-CM | POA: Diagnosis present

## 2018-08-25 ENCOUNTER — Other Ambulatory Visit: Payer: Self-pay | Admitting: Obstetrics and Gynecology

## 2018-08-31 ENCOUNTER — Telehealth: Payer: Self-pay | Admitting: Certified Nurse Midwife

## 2018-08-31 ENCOUNTER — Other Ambulatory Visit: Payer: Self-pay | Admitting: Obstetrics and Gynecology

## 2018-08-31 NOTE — Telephone Encounter (Signed)
The patient called and stated that she needs her birth control refilled. Please advise.

## 2018-09-01 ENCOUNTER — Other Ambulatory Visit: Payer: Self-pay | Admitting: Obstetrics and Gynecology

## 2018-09-02 ENCOUNTER — Other Ambulatory Visit: Payer: Self-pay | Admitting: Obstetrics and Gynecology

## 2018-10-18 ENCOUNTER — Other Ambulatory Visit: Payer: Self-pay

## 2018-10-18 DIAGNOSIS — Z20822 Contact with and (suspected) exposure to covid-19: Secondary | ICD-10-CM

## 2018-10-19 LAB — NOVEL CORONAVIRUS, NAA: SARS-CoV-2, NAA: NOT DETECTED

## 2018-12-05 DIAGNOSIS — B001 Herpesviral vesicular dermatitis: Secondary | ICD-10-CM

## 2018-12-06 MED ORDER — ACYCLOVIR 5 % EX OINT: TOPICAL_OINTMENT | CUTANEOUS | 2 refills | Status: DC

## 2018-12-06 NOTE — Addendum Note (Signed)
Addended by: Olin Hauser on: 12/06/2018 08:12 AM   Modules accepted: Orders

## 2018-12-07 MED ORDER — VALACYCLOVIR HCL 500 MG PO TABS: 500.0000 mg | ORAL_TABLET | Freq: Every day | ORAL | 1 refills | Status: DC

## 2018-12-07 NOTE — Addendum Note (Signed)
Addended by: Olin Hauser on: 12/07/2018 09:02 AM   Modules accepted: Orders

## 2019-04-11 ENCOUNTER — Other Ambulatory Visit: Payer: Self-pay

## 2019-04-11 ENCOUNTER — Encounter: Payer: Self-pay | Admitting: Certified Nurse Midwife

## 2019-04-11 ENCOUNTER — Other Ambulatory Visit (HOSPITAL_COMMUNITY)
Admission: RE | Admit: 2019-04-11 | Discharge: 2019-04-11 | Disposition: A | Discharge status: Home / Self Care | Payer: BC Managed Care – PPO | Source: Ambulatory Visit | Attending: Certified Nurse Midwife | Admitting: Certified Nurse Midwife

## 2019-04-11 ENCOUNTER — Ambulatory Visit (INDEPENDENT_AMBULATORY_CARE_PROVIDER_SITE_OTHER): Payer: BC Managed Care – PPO | Admitting: Certified Nurse Midwife

## 2019-04-11 VITALS — BP 116/71 | HR 74 | Ht 68.0 in | Wt 237.5 lb

## 2019-04-11 DIAGNOSIS — N898 Other specified noninflammatory disorders of vagina: Secondary | ICD-10-CM | POA: Insufficient documentation

## 2019-04-11 DIAGNOSIS — R635 Abnormal weight gain: Secondary | ICD-10-CM

## 2019-04-11 DIAGNOSIS — Z124 Encounter for screening for malignant neoplasm of cervix: Secondary | ICD-10-CM | POA: Insufficient documentation

## 2019-04-11 DIAGNOSIS — Z13220 Encounter for screening for lipoid disorders: Secondary | ICD-10-CM

## 2019-04-11 DIAGNOSIS — Z01419 Encounter for gynecological examination (general) (routine) without abnormal findings: Secondary | ICD-10-CM

## 2019-04-11 DIAGNOSIS — Z136 Encounter for screening for cardiovascular disorders: Secondary | ICD-10-CM

## 2019-04-11 DIAGNOSIS — Z113 Encounter for screening for infections with a predominantly sexual mode of transmission: Secondary | ICD-10-CM

## 2019-04-11 NOTE — Progress Notes (Signed)
Patient here for annual exam, no complaints.  

## 2019-04-11 NOTE — Progress Notes (Signed)
GYNECOLOGY ANNUAL PREVENTATIVE CARE ENCOUNTER NOTE  History:     LIARA HOLM is a 24 y.o. G0P0000 female here for a routine annual gynecologic exam.  Current complaints: weight gain. Pt states she thinks it is from the [facility], she has stopped taking it and uses condoms.    Denies abnormal vaginal bleeding, discharge, pelvic pain, problems with intercourse or other gynecologic concerns.     Social Heterosexual, in relationship Lives with boy friend Works full time Social worker Exercise : 1 x wk x 2 hrs Deneis smoking and drugs, occasional alcohol   Gynecologic History Patient's last menstrual period was 04/06/2019 (approximate). Contraception: condoms Last Pap: has not had one Last mammogram: n/a   Obstetric History OB History  Gravida Para Term Preterm AB Living  0 0 0 0 0 0  SAB TAB Ectopic Multiple Live Births  0 0 0 0 0    Past Medical History:  Diagnosis Date  . Anxiety   . Asthma   . GERD (gastroesophageal reflux disease)   . Sexually active at young age     Past Surgical History:  Procedure Laterality Date  . MANDIBLE SURGERY      Current Outpatient Medications on File Prior to Visit  Medication Sig Dispense Refill  . albuterol (PROVENTIL HFA;VENTOLIN HFA) 108 (90 BASE) MCG/ACT inhaler Inhale 2 puffs into the lungs 4 (four) times daily as needed.    Marland Kitchen albuterol (PROVENTIL) (2.5 MG/3ML) 0.083% nebulizer solution Take 3 mLs (2.5 mg total) by nebulization every 6 (six) hours as needed for wheezing or shortness of breath. 150 mL 2  . Spacer/Aero-Holding Chambers (AEROCHAMBER PLUS WITH MASK) inhaler Use as instructed 1 each 1  . valACYclovir (VALTREX) 500 MG tablet Take 1 tablet (500 mg total) by mouth daily. For cold sore prevention 90 tablet 1   No current facility-administered medications on file prior to visit.    No Known Allergies  Social History:  reports that she has never smoked. She has never used smokeless tobacco. She reports that she does not drink alcohol  or use drugs.  Family History  Problem Relation Age of Onset  . Depression Mother   . Anxiety disorder Mother   . Diabetes Father   . Nephrolithiasis Father   . Depression Father   . Breast cancer Neg Hx   . Ovarian cancer Neg Hx   . Colon cancer Neg Hx     The following portions of the patient's history were reviewed and updated as appropriate: allergies, current medications, past family history, past medical history, past social history, past surgical history and problem list.  Review of Systems Pertinent items noted in HPI and remainder of comprehensive ROS otherwise negative.  Physical Exam:  BP 116/71   Pulse 74   Ht 5\' 8"  (1.727 m)   Wt 237 lb 8 oz (107.7 kg)   LMP 04/06/2019 (Approximate)   BMI 36.11 kg/m  CONSTITUTIONAL: Well-developed, well-nourished, obese female in no acute distress.  HENT:  Normocephalic, atraumatic, External right and left ear normal. Oropharynx is clear and moist EYES: Conjunctivae and EOM are normal. Pupils are equal, round, and reactive to light. No scleral icterus.  NECK: Normal range of motion, supple, no masses.  Normal thyroid.  SKIN: Skin is warm and dry. No rash noted. Not diaphoretic. No erythema. No pallor. MUSCULOSKELETAL: Normal range of motion. No tenderness.  No cyanosis, clubbing, or edema.  2+ distal pulses. NEUROLOGIC: Alert and oriented to person, place, and time. Normal reflexes, muscle tone coordination.  PSYCHIATRIC: Normal mood and affect. Normal behavior. Normal judgment and thought content. CARDIOVASCULAR: Normal heart rate noted, regular rhythm RESPIRATORY: Clear to auscultation bilaterally. Effort and breath sounds normal, no problems with respiration noted. BREASTS: Symmetric in size. No masses, tenderness, skin changes, nipple drainage, or lymphadenopathy bilaterally. ABDOMEN: Soft, no distention noted.  No tenderness, rebound or guarding.  PELVIC: Normal appearing external genitalia and urethral meatus; normal  appearing vaginal mucosa and cervix.  No abnormal discharge noted.  Pap smear obtained.  Contact bleeding present, ectropion present. Normal uterine size, no other palpable masses, no uterine or adnexal tenderness.   Assessment and Plan:    1. Weight gain - Thyroid Panel With TSH  2. Cervical cancer screening - Cytology - PAP  3. Vaginal discharge - Cervicovaginal ancillary only  4. Encounter for lipid screening for cardiovascular disease - Lipid panel  5. Screening for STD (sexually transmitted disease) - HIV antibody (with reflex)  Will follow up results of pap smear and manage accordingly. Mammogram not indicated Labs: lipid, HIV, TSH,  Refills: none Routine preventative health maintenance measures emphasized. Encouraged weight loss.  Please refer to After Visit Summary for other counseling recommendations.      Philip Aspen, CN

## 2019-04-11 NOTE — Patient Instructions (Signed)
Preventive Care 21-22 Years Old, Female Preventive care refers to visits with your health care provider and lifestyle choices that can promote health and wellness. This includes:  A yearly physical exam. This may also be called an annual well check.  Regular dental visits and eye exams.  Immunizations.  Screening for certain conditions.  Healthy lifestyle choices, such as eating a healthy diet, getting regular exercise, not using drugs or products that contain nicotine and tobacco, and limiting alcohol use. What can I expect for my preventive care visit? Physical exam Your health care provider will check your:  Height and weight. This may be used to calculate body mass index (BMI), which tells if you are at a healthy weight.  Heart rate and blood pressure.  Skin for abnormal spots. Counseling Your health care provider may ask you questions about your:  Alcohol, tobacco, and drug use.  Emotional well-being.  Home and relationship well-being.  Sexual activity.  Eating habits.  Work and work environment.  Method of birth control.  Menstrual cycle.  Pregnancy history. What immunizations do I need?  Influenza (flu) vaccine  This is recommended every year. Tetanus, diphtheria, and pertussis (Tdap) vaccine  You may need a Td booster every 10 years. Varicella (chickenpox) vaccine  You may need this if you have not been vaccinated. Human papillomavirus (HPV) vaccine  If recommended by your health care provider, you may need three doses over 6 months. Measles, mumps, and rubella (MMR) vaccine  You may need at least one dose of MMR. You may also need a second dose. Meningococcal conjugate (MenACWY) vaccine  One dose is recommended if you are age 19-21 years and a first-year college student living in a residence hall, or if you have one of several medical conditions. You may also need additional booster doses. Pneumococcal conjugate (PCV13) vaccine  You may need  this if you have certain conditions and were not previously vaccinated. Pneumococcal polysaccharide (PPSV23) vaccine  You may need one or two doses if you smoke cigarettes or if you have certain conditions. Hepatitis A vaccine  You may need this if you have certain conditions or if you travel or work in places where you may be exposed to hepatitis A. Hepatitis B vaccine  You may need this if you have certain conditions or if you travel or work in places where you may be exposed to hepatitis B. Haemophilus influenzae type b (Hib) vaccine  You may need this if you have certain conditions. You may receive vaccines as individual doses or as more than one vaccine together in one shot (combination vaccines). Talk with your health care provider about the risks and benefits of combination vaccines. What tests do I need?  Blood tests  Lipid and cholesterol levels. These may be checked every 5 years starting at age 20.  Hepatitis C test.  Hepatitis B test. Screening  Diabetes screening. This is done by checking your blood sugar (glucose) after you have not eaten for a while (fasting).  Sexually transmitted disease (STD) testing.  BRCA-related cancer screening. This may be done if you have a family history of breast, ovarian, tubal, or peritoneal cancers.  Pelvic exam and Pap test. This may be done every 3 years starting at age 21. Starting at age 30, this may be done every 5 years if you have a Pap test in combination with an HPV test. Talk with your health care provider about your test results, treatment options, and if necessary, the need for more tests.   Follow these instructions at home: Eating and drinking   Eat a diet that includes fresh fruits and vegetables, whole grains, lean protein, and low-fat dairy.  Take vitamin and mineral supplements as recommended by your health care provider.  Do not drink alcohol if: ? Your health care provider tells you not to drink. ? You are  pregnant, may be pregnant, or are planning to become pregnant.  If you drink alcohol: ? Limit how much you have to 0-1 drink a day. ? Be aware of how much alcohol is in your drink. In the [place] one drink equals one 12 oz bottle of beer (355 mL), one 5 oz glass of wine (148 mL), or one 1 oz glass of hard liquor (44 mL). Lifestyle  Take daily care of your teeth and gums.  Stay active. Exercise for at least 30 minutes on 5 or more days each week.  Do not use any products that contain nicotine or tobacco, such as cigarettes, e-cigarettes, and chewing tobacco. If you need help quitting, ask your health care provider.  If you are sexually active, practice safe sex. Use a condom or other form of birth control (contraception) in order to prevent pregnancy and STIs (sexually transmitted infections). If you plan to become pregnant, see your health care provider for a preconception visit. What's next?  Visit your health care provider once a year for a well check visit.  Ask your health care provider how often you should have your eyes and teeth checked.  Stay up to date on all vaccines. This information is not intended to replace advice given to you by your health care provider. Make sure you discuss any questions you have with your health care provider. Document Revised: 10/14/2017 Document Reviewed: 10/14/2017 Elsevier Patient Education  2020 [employer].

## 2019-04-12 LAB — LIPID PANEL
Chol/HDL Ratio: 3.2 ratio (ref 0.0–4.4)
Cholesterol, Total: 166 mg/dL (ref 100–199)
HDL: 52 mg/dL (ref >39)
LDL Chol Calc (NIH): 102 mg/dL — ABNORMAL HIGH (ref 0–99)
Triglycerides: 62 mg/dL (ref 0–149)
VLDL Cholesterol Cal: 12 mg/dL (ref 5–40)

## 2019-04-12 LAB — HIV ANTIBODY (ROUTINE TESTING W REFLEX): HIV Screen 4th Generation wRfx: NONREACTIVE

## 2019-04-12 LAB — THYROID PANEL WITH TSH
Free Thyroxine Index: 1.7 (ref 1.2–4.9)
T3 Uptake Ratio: 23 % — ABNORMAL LOW (ref 24–39)
T4, Total: 7.5 ug/dL (ref 4.5–12.0)
TSH: 1.83 u[IU]/mL (ref 0.450–4.500)

## 2019-04-13 LAB — CERVICOVAGINAL ANCILLARY ONLY
Bacterial Vaginitis (gardnerella): POSITIVE — AB
Candida Glabrata: NEGATIVE
Candida Vaginitis: POSITIVE — AB
Chlamydia: NEGATIVE
Comment: NEGATIVE
Comment: NEGATIVE
Comment: NEGATIVE
Comment: NEGATIVE
Comment: NEGATIVE
Comment: NORMAL
Neisseria Gonorrhea: NEGATIVE
Trichomonas: NEGATIVE

## 2019-04-17 ENCOUNTER — Other Ambulatory Visit: Payer: Self-pay | Admitting: Certified Nurse Midwife

## 2019-04-17 MED ORDER — FLUCONAZOLE 150 MG PO TABS: 150.0000 mg | ORAL_TABLET | Freq: Once | ORAL | 1 refills | Status: AC

## 2019-04-17 MED ORDER — METRONIDAZOLE 500 MG PO TABS: 500.0000 mg | ORAL_TABLET | Freq: Two times a day (BID) | ORAL | 0 refills | Status: AC

## 2019-07-07 ENCOUNTER — Ambulatory Visit: Payer: BC Managed Care – PPO | Attending: Internal Medicine

## 2019-07-07 DIAGNOSIS — Z23 Encounter for immunization: Secondary | ICD-10-CM

## 2019-07-07 NOTE — Progress Notes (Signed)
   Covid-19 Vaccination Clinic  Name:  Ann Deleon    MRN: [id number] DOB: 1995/10/15  07/07/2019  Ann Deleon was observed post Covid-19 immunization for 15 minutes without incident. She was provided with Vaccine Information Sheet and instruction to access the V-Safe system.   Ann Deleon was instructed to call 911 with any severe reactions post vaccine: Marland Kitchen Difficulty breathing  . Swelling of face and throat  . A fast heartbeat  . A bad rash all over body  . Dizziness and weakness   Immunizations Administered    Name Date Dose VIS Date Route   Pfizer COVID-19 Vaccine 07/07/2019  9:01 AM 0.3 mL 04/12/2018 Intramuscular   Manufacturer: [employer], [employer]   Lot: [id number]   NDC: [address]-8

## 2020-04-15 ENCOUNTER — Encounter: Payer: BC Managed Care – PPO | Admitting: Certified Nurse Midwife

## 2020-07-23 DIAGNOSIS — S92425D Nondisplaced fracture of distal phalanx of left great toe, subsequent encounter for fracture with routine healing: Secondary | ICD-10-CM | POA: Diagnosis not present

## 2020-07-23 DIAGNOSIS — S92415D Nondisplaced fracture of proximal phalanx of left great toe, subsequent encounter for fracture with routine healing: Secondary | ICD-10-CM | POA: Diagnosis not present

## 2020-08-05 DIAGNOSIS — S92415D Nondisplaced fracture of proximal phalanx of left great toe, subsequent encounter for fracture with routine healing: Secondary | ICD-10-CM | POA: Diagnosis not present

## 2020-08-05 DIAGNOSIS — S92425D Nondisplaced fracture of distal phalanx of left great toe, subsequent encounter for fracture with routine healing: Secondary | ICD-10-CM | POA: Diagnosis not present

## 2020-08-05 DIAGNOSIS — M2580 Other specified joint disorders, unspecified joint: Secondary | ICD-10-CM | POA: Diagnosis not present

## 2020-08-16 DIAGNOSIS — J4520 Mild intermittent asthma, uncomplicated: Secondary | ICD-10-CM | POA: Diagnosis not present

## 2020-08-16 DIAGNOSIS — F419 Anxiety disorder, unspecified: Secondary | ICD-10-CM | POA: Diagnosis not present

## 2020-08-22 ENCOUNTER — Encounter: Payer: 59 | Admitting: Obstetrics and Gynecology

## 2020-08-22 LAB — CYTOLOGY - PAP: Diagnosis: NEGATIVE

## 2020-08-23 ENCOUNTER — Other Ambulatory Visit: Payer: Self-pay

## 2020-08-23 ENCOUNTER — Encounter: Payer: Self-pay | Admitting: Certified Nurse Midwife

## 2020-08-23 ENCOUNTER — Ambulatory Visit (INDEPENDENT_AMBULATORY_CARE_PROVIDER_SITE_OTHER): Payer: 59 | Admitting: Certified Nurse Midwife

## 2020-08-23 ENCOUNTER — Encounter: Payer: 59 | Admitting: Obstetrics and Gynecology

## 2020-08-23 ENCOUNTER — Other Ambulatory Visit (HOSPITAL_COMMUNITY)
Admission: RE | Admit: 2020-08-23 | Discharge: 2020-08-23 | Disposition: A | Discharge status: Home / Self Care | Payer: 59 | Source: Ambulatory Visit | Attending: Obstetrics and Gynecology | Admitting: Obstetrics and Gynecology

## 2020-08-23 VITALS — BP 125/79 | HR 74 | Resp 16 | Ht 67.0 in | Wt 241.2 lb

## 2020-08-23 DIAGNOSIS — Z1329 Encounter for screening for other suspected endocrine disorder: Secondary | ICD-10-CM | POA: Diagnosis not present

## 2020-08-23 DIAGNOSIS — Z842 Family history of other diseases of the genitourinary system: Secondary | ICD-10-CM | POA: Diagnosis not present

## 2020-08-23 DIAGNOSIS — Z124 Encounter for screening for malignant neoplasm of cervix: Secondary | ICD-10-CM

## 2020-08-23 DIAGNOSIS — Z113 Encounter for screening for infections with a predominantly sexual mode of transmission: Secondary | ICD-10-CM | POA: Diagnosis not present

## 2020-08-23 DIAGNOSIS — Z131 Encounter for screening for diabetes mellitus: Secondary | ICD-10-CM | POA: Diagnosis not present

## 2020-08-23 DIAGNOSIS — Z01419 Encounter for gynecological examination (general) (routine) without abnormal findings: Secondary | ICD-10-CM

## 2020-08-23 DIAGNOSIS — Z13220 Encounter for screening for lipoid disorders: Secondary | ICD-10-CM | POA: Diagnosis not present

## 2020-08-23 NOTE — Patient Instructions (Signed)
Preventive Care 21-23 Years Old, Female Preventive care refers to lifestyle choices and visits with your health care provider that can promote health and wellness. This includes: A yearly physical exam. This is also called an annual wellness visit. Regular dental and eye exams. Immunizations. Screening for certain conditions. Healthy lifestyle choices, such as: Eating a healthy diet. Getting regular exercise. Not using drugs or products that contain nicotine and tobacco. Limiting alcohol use. What can I expect for my preventive care visit? Physical exam Your health care provider may check your: Height and weight. These may be used to calculate your BMI (body mass index). BMI is a measurement that tells if you are at a healthy weight. Heart rate and blood pressure. Body temperature. Skin for abnormal spots. Counseling Your health care provider may ask you questions about your: Past medical problems. Family's medical history. Alcohol, tobacco, and drug use. Emotional well-being. Home life and relationship well-being. Sexual activity. Diet, exercise, and sleep habits. Work and work environment. Access to firearms. Method of birth control. Menstrual cycle. Pregnancy history. What immunizations do I need?  Vaccines are usually given at various ages, according to a schedule. Your health care provider will recommend vaccines for you based on your age, medicalhistory, and lifestyle or other factors, such as travel or where you work. What tests do I need?  Blood tests Lipid and cholesterol levels. These may be checked every 5 years starting at age 20. Hepatitis C test. Hepatitis B test. Screening Diabetes screening. This is done by checking your blood sugar (glucose) after you have not eaten for a while (fasting). STD (sexually transmitted disease) testing, if you are at risk. BRCA-related cancer screening. This may be done if you have a family history of breast, ovarian, tubal, or  peritoneal cancers. Pelvic exam and Pap test. This may be done every 3 years starting at age 21. Starting at age 30, this may be done every 5 years if you have a Pap test in combination with an HPV test. Talk with your health care provider about your test results, treatment options,and if necessary, the need for more tests. Follow these instructions at home: Eating and drinking  Eat a healthy diet that includes fresh fruits and vegetables, whole grains, lean protein, and low-fat dairy products. Take vitamin and mineral supplements as recommended by your health care provider. Do not drink alcohol if: Your health care provider tells you not to drink. You are pregnant, may be pregnant, or are planning to become pregnant. If you drink alcohol: Limit how much you have to 0-1 drink a day. Be aware of how much alcohol is in your drink. In the [place] one drink equals one 12 oz bottle of beer (355 mL), one 5 oz glass of wine (148 mL), or one 1 oz glass of hard liquor (44 mL).  Lifestyle Take daily care of your teeth and gums. Brush your teeth every morning and night with fluoride toothpaste. Floss one time each day. Stay active. Exercise for at least 30 minutes 5 or more days each week. Do not use any products that contain nicotine or tobacco, such as cigarettes, e-cigarettes, and chewing tobacco. If you need help quitting, ask your health care provider. Do not use drugs. If you are sexually active, practice safe sex. Use a condom or other form of protection to prevent STIs (sexually transmitted infections). If you do not wish to become pregnant, use a form of birth control. If you plan to become pregnant, see your health care   provider for a prepregnancy visit. Find healthy ways to cope with stress, such as: Meditation, yoga, or listening to music. Journaling. Talking to a trusted person. Spending time with friends and family. Safety Always wear your seat belt while driving or riding in a  vehicle. Do not drive: If you have been drinking alcohol. Do not ride with someone who has been drinking. When you are tired or distracted. While texting. Wear a helmet and other protective equipment during sports activities. If you have firearms in your house, make sure you follow all gun safety procedures. Seek help if you have been physically or sexually abused. What's next? Go to your health care provider once a year for an annual wellness visit. Ask your health care provider how often you should have your eyes and teeth checked. Stay up to date on all vaccines. This information is not intended to replace advice given to you by your health care provider. Make sure you discuss any questions you have with your healthcare provider. Document Revised: 10/01/2019 Document Reviewed: 10/14/2017 Elsevier Patient Education  2022 [employer].

## 2020-08-23 NOTE — Progress Notes (Signed)
ANNUAL PREVENTATIVE CARE GYN  ENCOUNTER NOTE  Subjective:       Ann Deleon is a 25 y.o. G0P0000 female here for a routine annual gynecologic exam.  Current complaints: 1.  Requests PAP smear and ANNUAL labs  Denies difficulty breathing or respiratory distress, chest pain, abdominal pain, excessive vaginal bleeding, dysuria, and leg pain or swelling.    Gynecologic History  Patient's last menstrual period was 08/23/2020 (exact date). Period Cycle (Days): 21 Period Duration (Days): 3-7 Period Pattern: Regular Menstrual Flow: Light, Heavy Menstrual Control: Tampon Menstrual Control Change Freq (Hours): 2 Dysmenorrhea: None  Contraception: condoms  Last Pap: 03/2019. Results were: normal  Obstetric History  OB History  Gravida Para Term Preterm AB Living  0 0 0 0 0 0  SAB IAB Ectopic Multiple Live Births  0 0 0 0 0    Past Medical History:  Diagnosis Date   Anxiety    Asthma    GERD (gastroesophageal reflux disease)    Sexually active at young age     Past Surgical History:  Procedure Laterality Date   MANDIBLE SURGERY      Current Outpatient Medications on File Prior to Visit  Medication Sig Dispense Refill   albuterol (PROVENTIL HFA;VENTOLIN HFA) 108 (90 BASE) MCG/ACT inhaler Inhale 2 puffs into the lungs 4 (four) times daily as needed.     albuterol (PROVENTIL) (2.5 MG/3ML) 0.083% nebulizer solution Take 3 mLs (2.5 mg total) by nebulization every 6 (six) hours as needed for wheezing or shortness of breath. 150 mL 2   busPIRone (BUSPAR) 5 MG tablet Take 1 tablet by mouth 2 (two) times daily.     famciclovir (FAMVIR) 500 MG tablet Take 500 mg by mouth 2 (two) times daily.     Spacer/Aero-Holding Chambers (AEROCHAMBER PLUS WITH MASK) inhaler Use as instructed 1 each 1   valACYclovir (VALTREX) 500 MG tablet Take 1 tablet (500 mg total) by mouth daily. For cold sore prevention 90 tablet 1   No current facility-administered medications on file prior to visit.     No Known Allergies  Social History   Socioeconomic History   Marital status: Single    Spouse name: Not on file   Number of children: Not on file   Years of education: Not on file   Highest education level: Not on file  Occupational History   Not on file  Tobacco Use   Smoking status: Never   Smokeless tobacco: Never  Vaping Use   Vaping Use: Never used  Substance and Sexual Activity   Alcohol use: No   Drug use: No   Sexual activity: Yes    Birth control/protection: Condom  Other Topics Concern   Not on file  Social History Narrative   Not on file   Social Determinants of Health   Financial Resource Strain: Not on file  Food Insecurity: Not on file  Transportation Needs: Not on file  Physical Activity: Not on file  Stress: Not on file  Social Connections: Not on file  Intimate Partner Violence: Not on file    Family History  Problem Relation Age of Onset   Depression Mother    Anxiety disorder Mother    Diabetes Father    Nephrolithiasis Father    Depression Father    Breast cancer Neg Hx    Ovarian cancer Neg Hx    Colon cancer Neg Hx     The following portions of the patient's history were reviewed and updated as appropriate:  allergies, current medications, past family history, past medical history, past social history, past surgical history and problem list.  Review of Systems  ROS negative except as noted above. Information obtained from patient.    Objective:   BP 125/79   Pulse 74   Resp 16   Ht 5\' 7"  (1.702 m)   Wt 241 lb 3.2 oz (109.4 kg)   LMP 08/23/2020 (Exact Date)   BMI 37.78 kg/m   CONSTITUTIONAL: Well-developed, well-nourished female in no acute distress.   PSYCHIATRIC: Normal mood and affect. Normal behavior. Normal judgment and thought content.  NEUROLGIC: Alert and oriented to person, place, and time. Normal muscle tone coordination. No cranial nerve deficit noted.  HENT:  Normocephalic, atraumatic.  EYES: Conjunctivae  and EOM are normal.   NECK: Normal range of motion, supple, no masses.  Normal thyroid.   SKIN: Skin is warm and dry. No rash noted. Not diaphoretic. No erythema. No pallor.  CARDIOVASCULAR: Normal heart rate noted, regular rhythm, no murmur.  RESPIRATORY: Clear to auscultation bilaterally. Effort and breath sounds normal, no problems with respiration noted.  BREASTS: Symmetric in size. No masses, skin changes, nipple drainage, or lymphadenopathy.  ABDOMEN: Soft, normal bowel sounds, no distention noted.  No tenderness, rebound or guarding.   PELVIC:  External Genitalia: Normal  Vagina: Normal  Cervix: Normal, Pap collected  Uterus: Normal  Adnexa: Normal  MUSCULOSKELETAL: Normal range of motion. No tenderness.  No cyanosis, clubbing, or edema.  2+ distal pulses.  LYMPHATIC: No Axillary, Supraclavicular, or Inguinal Adenopathy.  Assessment:   Annual gynecologic examination 25 y.o. Contraception: condoms Obesity 2 Problem List Items Addressed This Visit   None Visit Diagnoses     Well woman exam    -  Primary   Relevant Orders   TSH   Lipid panel   HIV Antibody (routine testing w rflx)   Cytology - PAP   Hemoglobin A1c   Screen for STD (sexually transmitted disease)       Relevant Orders   Cervicovaginal ancillary only   HIV Antibody (routine testing w rflx)   Screening cholesterol level       Relevant Orders   Lipid panel   Screening for thyroid disorder       Relevant Orders   TSH   Screening for diabetes mellitus       Relevant Orders   Hemoglobin A1c   Family history of ovarian cyst       Screening for cervical cancer       Relevant Orders   Cytology - PAP       Plan:  Pap: Pap, Reflex if ASCUS  Labs:  See orders  Routine preventative health maintenance measures emphasized: Exercise/Diet/Weight control, Tobacco Warnings, Alcohol/Substance use risks, and Stress Management; see AVS  Reviewed red flag symptoms and when to call  Return to Clinic -  1 Year for 30 or sooner if needed   [facility], CNM

## 2020-08-24 LAB — HEPATITIS C ANTIBODY: Hep C Virus Ab: <0.1 s/co ratio (ref 0.0–0.9)

## 2020-08-24 LAB — FSH/LH
FSH: 6.9 m[IU]/mL
LH: 6.8 m[IU]/mL

## 2020-08-24 LAB — HEMOGLOBIN A1C
Est. average glucose Bld gHb Est-mCnc: 100 mg/dL
Hgb A1c MFr Bld: 5.1 % (ref 4.8–5.6)

## 2020-08-24 LAB — TSH: TSH: 1.47 u[IU]/mL (ref 0.450–4.500)

## 2020-08-24 LAB — LIPID PANEL
Chol/HDL Ratio: 3.4 ratio (ref 0.0–4.4)
Cholesterol, Total: 142 mg/dL (ref 100–199)
HDL: 42 mg/dL (ref >39)
LDL Chol Calc (NIH): 82 mg/dL (ref 0–99)
Triglycerides: 95 mg/dL (ref 0–149)
VLDL Cholesterol Cal: 18 mg/dL (ref 5–40)

## 2020-08-24 LAB — HIV ANTIBODY (ROUTINE TESTING W REFLEX): HIV Screen 4th Generation wRfx: NONREACTIVE

## 2020-08-27 ENCOUNTER — Telehealth: Payer: Self-pay

## 2020-08-27 ENCOUNTER — Other Ambulatory Visit: Payer: Self-pay | Admitting: Certified Nurse Midwife

## 2020-08-27 LAB — CERVICOVAGINAL ANCILLARY ONLY
Bacterial Vaginitis (gardnerella): POSITIVE — AB
Candida Glabrata: NEGATIVE
Candida Vaginitis: POSITIVE — AB
Chlamydia: NEGATIVE
Comment: NEGATIVE
Comment: NEGATIVE
Comment: NEGATIVE
Comment: NEGATIVE
Comment: NEGATIVE
Comment: NORMAL
Neisseria Gonorrhea: NEGATIVE
Trichomonas: NEGATIVE

## 2020-08-27 MED ORDER — METRONIDAZOLE 500 MG PO TABS: 500.0000 mg | ORAL_TABLET | Freq: Two times a day (BID) | ORAL | 0 refills | Status: AC

## 2020-08-27 MED ORDER — FLUCONAZOLE 150 MG PO TABS: 150.0000 mg | ORAL_TABLET | Freq: Once | ORAL | 0 refills | Status: AC

## 2020-08-27 NOTE — Progress Notes (Signed)
Orders placed for treatment of BV and yeast infection.   Doreene Burke, CNM

## 2020-08-27 NOTE — Telephone Encounter (Signed)
Pt is requesting medication for BV and yeast.  Please see [email or web address].   [employer].   Pls advise. Thank you.

## 2020-09-02 DIAGNOSIS — M6080 Other myositis, unspecified site: Secondary | ICD-10-CM | POA: Diagnosis not present

## 2020-09-02 DIAGNOSIS — M9903 Segmental and somatic dysfunction of lumbar region: Secondary | ICD-10-CM | POA: Diagnosis not present

## 2020-09-02 DIAGNOSIS — M5441 Lumbago with sciatica, right side: Secondary | ICD-10-CM | POA: Diagnosis not present

## 2020-09-02 DIAGNOSIS — M62830 Muscle spasm of back: Secondary | ICD-10-CM | POA: Diagnosis not present

## 2020-09-02 DIAGNOSIS — M9902 Segmental and somatic dysfunction of thoracic region: Secondary | ICD-10-CM | POA: Diagnosis not present

## 2020-09-02 DIAGNOSIS — M546 Pain in thoracic spine: Secondary | ICD-10-CM | POA: Diagnosis not present

## 2020-09-02 DIAGNOSIS — M955 Acquired deformity of pelvis: Secondary | ICD-10-CM | POA: Diagnosis not present

## 2020-09-02 DIAGNOSIS — M461 Sacroiliitis, not elsewhere classified: Secondary | ICD-10-CM | POA: Diagnosis not present

## 2020-09-03 LAB — CYTOLOGY - PAP: Adequacy: ABNORMAL

## 2020-09-06 DIAGNOSIS — H6503 Acute serous otitis media, bilateral: Secondary | ICD-10-CM | POA: Diagnosis not present

## 2020-09-11 ENCOUNTER — Telehealth: Payer: Self-pay

## 2020-09-11 NOTE — Telephone Encounter (Signed)
Spoke with patient on phone in regards to [employer]. Pt stated she finished entire course of medication but was still having white discharge. She denies any other symptoms and was advised to use OTC 7-day monistat. Patient advised if symptoms were still present on day 9 or worsened before then to call and schedule an appointment. Patient agrees to plan and voiced no further concerns.

## 2020-09-20 ENCOUNTER — Encounter: Payer: 59 | Admitting: Obstetrics and Gynecology

## 2020-09-26 ENCOUNTER — Encounter: Payer: 59 | Admitting: Obstetrics and Gynecology

## 2020-09-27 DIAGNOSIS — F419 Anxiety disorder, unspecified: Secondary | ICD-10-CM | POA: Diagnosis not present

## 2020-10-02 ENCOUNTER — Other Ambulatory Visit: Payer: Self-pay

## 2020-10-02 ENCOUNTER — Telehealth: Payer: Self-pay | Admitting: Certified Nurse Midwife

## 2020-10-02 DIAGNOSIS — B379 Candidiasis, unspecified: Secondary | ICD-10-CM

## 2020-10-02 MED ORDER — FLUCONAZOLE 150 MG PO TABS: 150.0000 mg | ORAL_TABLET | Freq: Once | ORAL | 0 refills | Status: AC

## 2020-10-02 NOTE — Telephone Encounter (Signed)
Pt called about yeast symptoms present for a while- had recent physical where she was given med- meds completed pt requesting a rx for different meds. Please Advise.

## 2020-10-03 ENCOUNTER — Other Ambulatory Visit: Payer: Self-pay

## 2020-10-03 DIAGNOSIS — B379 Candidiasis, unspecified: Secondary | ICD-10-CM

## 2020-10-03 MED ORDER — FLUCONAZOLE 150 MG PO TABS: 150.0000 mg | ORAL_TABLET | Freq: Once | ORAL | 0 refills | Status: AC

## 2020-10-09 DIAGNOSIS — R300 Dysuria: Secondary | ICD-10-CM | POA: Diagnosis not present

## 2020-10-09 DIAGNOSIS — B373 Candidiasis of vulva and vagina: Secondary | ICD-10-CM | POA: Diagnosis not present

## 2020-10-09 DIAGNOSIS — N760 Acute vaginitis: Secondary | ICD-10-CM | POA: Diagnosis not present

## 2020-10-24 ENCOUNTER — Other Ambulatory Visit (HOSPITAL_COMMUNITY)
Admission: RE | Admit: 2020-10-24 | Discharge: 2020-10-24 | Disposition: A | Discharge status: Home / Self Care | Payer: 59 | Source: Ambulatory Visit | Attending: Certified Nurse Midwife | Admitting: Certified Nurse Midwife

## 2020-10-24 ENCOUNTER — Ambulatory Visit (INDEPENDENT_AMBULATORY_CARE_PROVIDER_SITE_OTHER): Payer: 59 | Admitting: Obstetrics and Gynecology

## 2020-10-24 ENCOUNTER — Other Ambulatory Visit: Payer: Self-pay

## 2020-10-24 ENCOUNTER — Encounter: Payer: Self-pay | Admitting: Obstetrics and Gynecology

## 2020-10-24 VITALS — BP 118/77 | HR 73 | Resp 16 | Ht 68.0 in | Wt 245.1 lb

## 2020-10-24 DIAGNOSIS — N898 Other specified noninflammatory disorders of vagina: Secondary | ICD-10-CM | POA: Insufficient documentation

## 2020-10-24 NOTE — Progress Notes (Signed)
HPI:      [name] is a 25 y.o. G0P0000 who LMP was Patient's last menstrual period was 10/14/2020 (exact date).  Subjective:   She presents today stating that she has had a vaginal infection that despite the use of antifungals has not resolved.  She reports a vaginal discharge with odor. Looking back through her records and note that she has had BV and yeast but she reports that she is only been treated for yeast. She is somewhat concerned why she gets an infection 1-2 times per year.    Hx: The following portions of the patient's history were reviewed and updated as appropriate:             She  has a past medical history of Anxiety, Asthma, GERD (gastroesophageal reflux disease), and Sexually active at young age. She does not have any pertinent problems on file. She  has a past surgical history that includes Mandible surgery. Her family history includes Anxiety disorder in her mother; Depression in her father and mother; Diabetes in her father; Nephrolithiasis in her father. She  reports that she has never smoked. She has never used smokeless tobacco. She reports that she does not drink alcohol and does not use drugs. She has a current medication list which includes the following prescription(s): albuterol, albuterol, buspirone, famciclovir, propranolol, aerochamber plus with mask, and valacyclovir. She has No Known Allergies.       Review of Systems:  Review of Systems  Constitutional: Denied constitutional symptoms, night sweats, recent illness, fatigue, fever, insomnia and weight loss.  Eyes: Denied eye symptoms, eye pain, photophobia, vision change and visual disturbance.  Ears/Nose/Throat/Neck: Denied ear, nose, throat or neck symptoms, hearing loss, nasal discharge, sinus congestion and sore throat.  Cardiovascular: Denied cardiovascular symptoms, arrhythmia, chest pain/pressure, edema, exercise intolerance, orthopnea and palpitations.  Respiratory: Denied pulmonary  symptoms, asthma, pleuritic pain, productive sputum, cough, dyspnea and wheezing.  Gastrointestinal: Denied, gastro-esophageal reflux, melena, nausea and vomiting.  Genitourinary: See HPI for additional information.  Musculoskeletal: Denied musculoskeletal symptoms, stiffness, swelling, muscle weakness and myalgia.  Dermatologic: Denied dermatology symptoms, rash and scar.  Neurologic: Denied neurology symptoms, dizziness, headache, neck pain and syncope.  Psychiatric: Denied psychiatric symptoms, anxiety and depression.  Endocrine: Denied endocrine symptoms including hot flashes and night sweats.   Meds:   Current Outpatient Medications on File Prior to Visit  Medication Sig Dispense Refill   albuterol (PROVENTIL HFA;VENTOLIN HFA) 108 (90 BASE) MCG/ACT inhaler Inhale 2 puffs into the lungs 4 (four) times daily as needed.     albuterol (PROVENTIL) (2.5 MG/3ML) 0.083% nebulizer solution Take 3 mLs (2.5 mg total) by nebulization every 6 (six) hours as needed for wheezing or shortness of breath. 150 mL 2   busPIRone (BUSPAR) 5 MG tablet Take 1 tablet by mouth 2 (two) times daily.     famciclovir (FAMVIR) 500 MG tablet Take 500 mg by mouth 2 (two) times daily.     propranolol (INDERAL) 10 MG tablet Take by mouth.     Spacer/Aero-Holding Chambers (AEROCHAMBER PLUS WITH MASK) inhaler Use as instructed 1 each 1   valACYclovir (VALTREX) 500 MG tablet Take 1 tablet (500 mg total) by mouth daily. For cold sore prevention 90 tablet 1   No current facility-administered medications on file prior to visit.      Objective:     Vitals:   10/24/20 0853  BP: 118/77  Pulse: 73  Resp: 16   Filed Weights   10/24/20 0853  Weight: 245 lb 1.6 oz (111.2 kg)              Physical examination   Pelvic:   Vulva: Normal appearance.  No lesions.  Vagina: No lesions or abnormalities noted.  Support: Normal pelvic support.  Urethra No masses tenderness or scarring.  Meatus Normal size without lesions  or prolapse.  Cervix: Normal appearance.  No lesions.  Anus: Normal exam.  No lesions.  Perineum: Normal exam.  No lesions.                         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 Vaginal discharge   2. Vaginal odor     Based on her culture history it seems as if she has had both yeast and BV diagnosed concurrently.  She states that she has only been treated for the yeast. She continues to have a vaginal discharge with odor.   Plan:            1.  We have discussed BV and yeast and vaginitis in general.  Use of probiotics and antibiotics, douching, bubble baths etc. discussed and their effect on vaginitis.  2.  Swab performed so they correct diagnosis can be made in the appropriate treatment given.  3.  Literature regarding vaginitis given. Orders No orders of the defined types were placed in this encounter.   No orders of the defined types were placed in this encounter.     F/U  Return for We will contact her with any abnormal test results. I spent 23 minutes involved in the care of this patient preparing to see the patient by obtaining and reviewing her medical history (including labs, imaging tests and prior procedures), documenting clinical information in the electronic health record (EHR), counseling and  coordinating care plans, writing and sending prescriptions, ordering tests or procedures and in direct communicating with the patient and medical staff discussing pertinent items from her history and physical exam.  Elonda Husky, M.D. 10/24/2020 9:37 AM

## 2020-10-25 LAB — CERVICOVAGINAL ANCILLARY ONLY
Bacterial Vaginitis (gardnerella): NEGATIVE
Candida Glabrata: NEGATIVE
Candida Vaginitis: NEGATIVE
Comment: NEGATIVE
Comment: NEGATIVE
Comment: NEGATIVE

## 2020-11-05 ENCOUNTER — Encounter: Payer: 59 | Admitting: Certified Nurse Midwife

## 2020-11-20 ENCOUNTER — Ambulatory Visit
Admission: EM | Admit: 2020-11-20 | Discharge: 2020-11-20 | Disposition: A | Discharge status: Home / Self Care | Payer: 59 | Attending: Family Medicine | Admitting: Family Medicine

## 2020-11-20 ENCOUNTER — Other Ambulatory Visit: Payer: Self-pay

## 2020-11-20 DIAGNOSIS — J45901 Unspecified asthma with (acute) exacerbation: Secondary | ICD-10-CM | POA: Diagnosis not present

## 2020-11-20 MED ORDER — PREDNISONE 50 MG PO TABS: ORAL_TABLET | ORAL | 0 refills | Status: DC

## 2020-11-20 MED ORDER — PROMETHAZINE-DM 6.25-15 MG/5ML PO SYRP: 5.0000 mL | ORAL_SOLUTION | Freq: Four times a day (QID) | ORAL | 0 refills | Status: DC | PRN

## 2020-11-20 NOTE — Discharge Instructions (Signed)
Rest.  Lots of fluids.  Medication as prescribed.  Take care  Dr. Tramain Gershman  

## 2020-11-20 NOTE — ED Triage Notes (Signed)
Pt reports feeling SOB, chest tightness and cough since last Sunday. Sts she tried using a breathing treatment without relief.

## 2020-11-20 NOTE — ED Provider Notes (Signed)
[facility]    CSN: [id number] Arrival date & time: 11/20/20  1809      History   Chief Complaint Chief Complaint  Patient presents with  . Shortness of Breath    HPI 25 year old female presents with the above complaint.  Patient reports that she has been sick since 9/25.  Patient states that she has had chest tightness, shortness of breath, and persistent cough.  Cough is worse at night.  She has used albuterol inhaler and nebulizer without improvement.  She has known asthma.  No documented fever.  No other complaints or concerns at this time.  Past Medical History:  Diagnosis Date  . Anxiety   . Asthma   . GERD (gastroesophageal reflux disease)   . Sexually active at young age     Patient Active Problem List   Diagnosis Date Noted  . Recurrent major depressive disorder, in full remission (HCC) 05/13/2018  . GAD (generalized anxiety disorder) 12/16/2016  . Obesity (BMI 30.0-34.9) 12/16/2016  . Contraception management 06/15/2016  . Mild episode of recurrent major depressive disorder (HCC) 06/15/2016  . History of concussion 05/05/2016  . Contusion of other part of head 05/05/2016  . 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 Headache 01/14/2016  . Fatigue 11/19/2015  . Allergic rhinitis due to pollen 04/12/2015  . Asthma, mild persistent 07/06/2014  . Dermatitis, eczematoid 07/06/2014  . Dysmenorrhea 07/06/2014  . Acid reflux 07/06/2014  . Anxiety 07/06/2014    Past Surgical History:  Procedure Laterality Date  . MANDIBLE SURGERY      OB History     Gravida  0   Para  0   Term  0   Preterm  0   AB  0   Living  0      SAB  0   IAB  0   Ectopic  0   Multiple  0   Live Births  0            Home Medications    Prior to Admission medications   Medication Sig Start Date End Date Taking? Authorizing Provider  predniSONE (DELTASONE) 50 MG tablet 1 tablet daily x 5 days 11/20/20  Yes Diantha Paxson G, DO  promethazine-dextromethorphan  (PROMETHAZINE-DM) 6.25-15 MG/5ML syrup Take 5 mLs by mouth 4 (four) times daily as needed for cough. 11/20/20  Yes Robben Jagiello G, DO  albuterol (PROVENTIL HFA;VENTOLIN HFA) 108 (90 BASE) MCG/ACT inhaler Inhale 2 puffs into the lungs 4 (four) times daily as needed. 06/05/13   Loura Pardon, NP  albuterol (PROVENTIL) (2.5 MG/3ML) 0.083% nebulizer solution Take 3 mLs (2.5 mg total) by nebulization every 6 (six) hours as needed for wheezing or shortness of breath. 02/28/16   Karamalegos, Netta Neat, DO  busPIRone (BUSPAR) 5 MG tablet Take 1 tablet by mouth 2 (two) times daily. 08/16/20 08/16/21  [provider]  famciclovir (FAMVIR) 500 MG tablet Take 500 mg by mouth 2 (two) times daily. 06/17/20   [provider]  propranolol (INDERAL) 10 MG tablet Take by mouth. 09/27/20 09/27/21  [provider]  Spacer/Aero-Holding Chambers (AEROCHAMBER PLUS WITH MASK) inhaler Use as instructed 10/29/15   Smitty Cords, DO  valACYclovir (VALTREX) 500 MG tablet Take 1 tablet (500 mg total) by mouth daily. For cold sore prevention 12/07/18   Smitty Cords, DO    Family History Family History  Problem Relation Age of Onset  . Depression Mother   . Anxiety disorder Mother   . Diabetes  Father   . Nephrolithiasis Father   . Depression Father   . Breast cancer Neg Hx   . Ovarian cancer Neg Hx   . Colon cancer Neg Hx     Social History Social History   Tobacco Use  . Smoking status: Never  . Smokeless tobacco: Never  Vaping Use  . Vaping Use: Never used  Substance Use Topics  . Alcohol use: Yes  . Drug use: No     Allergies   Patient has no known allergies.   Review of Systems Review of Systems  Constitutional:  Negative for fever.  Respiratory:  Positive for cough, chest tightness and shortness of breath.    Physical Exam Triage Vital Signs ED Triage Vitals  Enc Vitals Group     BP 11/20/20 1840 (!) 164/98     Pulse Rate 11/20/20 1840 76     Resp  11/20/20 1840 18     Temp 11/20/20 1840 98.3 F (36.8 C)     Temp Source 11/20/20 1840 Oral     SpO2 11/20/20 1840 100 %     Weight 11/20/20 1841 240 lb (108.9 kg)     Height 11/20/20 1841 5\' 8"  (1.727 m)     Head Circumference --      Peak Flow --      Pain Score 11/20/20 1841 6     Pain Loc --      Pain Edu? --      Excl. in GC? --    Updated Vital Signs BP (!) 164/98   Pulse 76   Temp 98.3 F (36.8 C) (Oral)   Resp 18   Ht 5\' 8"  (1.727 m)   Wt 108.9 kg   SpO2 100%   BMI 36.49 kg/m   Visual Acuity Right Eye Distance:   Left Eye Distance:   Bilateral Distance:    Right Eye Near:   Left Eye Near:    Bilateral Near:     Physical Exam Vitals and nursing note reviewed.  Constitutional:      General: She is not in acute distress.    Appearance: She is well-developed. She is not ill-appearing.  HENT:     Head: Normocephalic and atraumatic.     Mouth/Throat:     Pharynx: Oropharynx is clear.  Cardiovascular:     Rate and Rhythm: Normal rate and regular rhythm.  Pulmonary:     Effort: Pulmonary effort is normal.     Breath sounds: Wheezing present.  Neurological:     Mental Status: She is alert.  Psychiatric:        Mood and Affect: Mood normal.        Behavior: Behavior normal.     UC Treatments / Results  Labs (all labs ordered are listed, but only abnormal results are displayed) Labs Reviewed - No data to display  EKG   Radiology No results found.  Procedures Procedures (including critical care time)  Medications Ordered in UC Medications - No data to display  Initial Impression / Assessment and Plan / UC Course  I have reviewed the triage vital signs and the nursing notes.  Pertinent labs & imaging results that were available during my care of the patient were reviewed by me and considered in my medical decision making (see chart for details).    25 year old female presents with asthmatic bronchitis.  Treating with prednisone and  Promethazine DM.  Final Clinical Impressions(s) / UC Diagnoses   Final diagnoses:  Asthmatic bronchitis with  acute exacerbation, unspecified asthma severity, unspecified whether persistent     Discharge Instructions      Rest.  Lots of fluids.  Medication as prescribed.  Take care  Dr. Adriana Simas    ED Prescriptions     Medication Sig Dispense Auth. Provider   predniSONE (DELTASONE) 50 MG tablet 1 tablet daily x 5 days 5 tablet Nickalous Stingley G, DO   promethazine-dextromethorphan (PROMETHAZINE-DM) 6.25-15 MG/5ML syrup Take 5 mLs by mouth 4 (four) times daily as needed for cough. 118 mL Tommie Sams, DO      PDMP not reviewed this encounter.   Tommie Sams, [place] 11/20/20 2003

## 2020-12-06 DIAGNOSIS — F419 Anxiety disorder, unspecified: Secondary | ICD-10-CM | POA: Diagnosis not present

## 2020-12-25 DIAGNOSIS — F419 Anxiety disorder, unspecified: Secondary | ICD-10-CM | POA: Diagnosis not present

## 2021-01-31 ENCOUNTER — Other Ambulatory Visit (HOSPITAL_COMMUNITY)
Admission: RE | Admit: 2021-01-31 | Discharge: 2021-01-31 | Disposition: A | Discharge status: Home / Self Care | Payer: 59 | Source: Ambulatory Visit | Attending: Obstetrics and Gynecology | Admitting: Obstetrics and Gynecology

## 2021-01-31 ENCOUNTER — Ambulatory Visit (INDEPENDENT_AMBULATORY_CARE_PROVIDER_SITE_OTHER): Payer: 59 | Admitting: Obstetrics and Gynecology

## 2021-01-31 ENCOUNTER — Ambulatory Visit: Payer: 59

## 2021-01-31 ENCOUNTER — Other Ambulatory Visit: Payer: Self-pay

## 2021-01-31 DIAGNOSIS — N898 Other specified noninflammatory disorders of vagina: Secondary | ICD-10-CM | POA: Diagnosis not present

## 2021-01-31 DIAGNOSIS — F419 Anxiety disorder, unspecified: Secondary | ICD-10-CM | POA: Diagnosis not present

## 2021-01-31 NOTE — Progress Notes (Signed)
Pt presents for vaginal self swab complaining of vaginal discharge, odor, itching x 1 week. Pt pharmacy confirmed correct on file for treatment if necessary. No further questions or concerns voiced by patient at this time.

## 2021-02-03 ENCOUNTER — Encounter: Payer: Self-pay | Admitting: Obstetrics and Gynecology

## 2021-02-03 LAB — CERVICOVAGINAL ANCILLARY ONLY
Bacterial Vaginitis (gardnerella): POSITIVE — AB
Candida Glabrata: NEGATIVE
Candida Vaginitis: NEGATIVE
Comment: NEGATIVE
Comment: NEGATIVE
Comment: NEGATIVE

## 2021-02-03 NOTE — Progress Notes (Signed)
Her results are c/w BV - Needs Rx for: Flagyl 500mg - 1 PO BID X7 days

## 2021-02-04 ENCOUNTER — Telehealth: Payer: Self-pay | Admitting: Obstetrics and Gynecology

## 2021-02-04 NOTE — Telephone Encounter (Signed)
Pt is calling in to see if the medication (Flagyl 500 mg) was called in that was mentioned in her results note.  Pt stated that she will be using pharmacy [facility] in [place].  Pt would like to have a call back.

## 2021-02-06 ENCOUNTER — Other Ambulatory Visit: Payer: Self-pay | Admitting: Obstetrics and Gynecology

## 2021-02-06 DIAGNOSIS — N898 Other specified noninflammatory disorders of vagina: Secondary | ICD-10-CM

## 2021-02-06 MED ORDER — METRONIDAZOLE 500 MG PO TABS: 500.0000 mg | ORAL_TABLET | Freq: Two times a day (BID) | ORAL | 0 refills | Status: AC

## 2021-02-06 NOTE — Telephone Encounter (Signed)
Pt called following up on this medication- what do you advise. Pt last seen with Logan Bores

## 2021-02-16 NOTE — L&D Delivery Note (Signed)
Delivery Summary for [facility]  Labor Events:   Preterm labor: No data found  Rupture date: No data found  Rupture time: No data found  Rupture type: No data found  Fluid Color: No data found  Induction: No data found  Augmentation: No data found  Complications: No data found  Cervical ripening: No data found No data found   No data found     Delivery:   Episiotomy: No data found  Lacerations: No data found  Repair suture: No data found  Repair # of packets: No data found  Blood loss (ml): No data found   Information for the patient's newborn:  [name], [name] [[id number]]   Delivery 11/03/2021 12:59 PM by  C-Section, Low Transverse Sex:  female Gestational Age: [redacted]w[redacted]d Delivery Clinician:   Living?:         APGARS  One minute Five minutes Ten minutes  Skin color:        Heart rate:        Grimace:        Muscle tone:        Breathing:        Totals: 8  9      Presentation/position:      Resuscitation:   Cord information:    Disposition of cord blood:     Blood gases sent?  Complications:   Placenta: Delivered:       appearance Newborn Measurements: Weight: 6 lb 15.1 oz (3150 g)  Height: 20.28"  Head circumference:    Chest circumference:    Other providers:    Additional  information: Forceps:   Vacuum:   Breech:   Observed anomalies       See Dr. Andreas Blower operative note for details of C-section procedure.    Rubie Maid, MD [facility]

## 2021-03-10 ENCOUNTER — Other Ambulatory Visit: Payer: Self-pay

## 2021-03-10 ENCOUNTER — Ambulatory Visit (INDEPENDENT_AMBULATORY_CARE_PROVIDER_SITE_OTHER): Payer: Self-pay | Admitting: Certified Nurse Midwife

## 2021-03-10 ENCOUNTER — Encounter: Payer: Self-pay | Admitting: Certified Nurse Midwife

## 2021-03-10 VITALS — BP 148/84 | HR 64 | Ht 68.0 in | Wt 256.0 lb

## 2021-03-10 DIAGNOSIS — Z3201 Encounter for pregnancy test, result positive: Secondary | ICD-10-CM

## 2021-03-10 DIAGNOSIS — Z3200 Encounter for pregnancy test, result unknown: Secondary | ICD-10-CM

## 2021-03-10 DIAGNOSIS — N912 Amenorrhea, unspecified: Secondary | ICD-10-CM

## 2021-03-10 LAB — POCT URINE PREGNANCY: Preg Test, Ur: POSITIVE — AB

## 2021-03-10 NOTE — Patient Instructions (Signed)
Prenatal Care ?Prenatal care is health care during pregnancy. It helps you and your unborn baby (fetus) stay as healthy as possible. Prenatal care may be provided by a midwife, a family practice doctor, a mid-level practitioner (nurse practitioner or physician assistant), or a childbirth and pregnancy doctor (obstetrician). ?How does this affect me? ?During pregnancy, you will be closely monitored for any new conditions that might develop. To lower your risk of pregnancy complications, you and your health care provider will talk about any underlying conditions you have. ?How does this affect my baby? ?Early and consistent prenatal care increases the chance that your baby will be healthy during pregnancy. Prenatal care lowers the risk that your baby will be: ?Born early (prematurely). ?Smaller than expected at birth (small for gestational age). ?What can I expect at the first prenatal care visit? ?Your first prenatal care visit will likely be the longest. You should schedule your first prenatal care visit as soon as you know that you are pregnant. Your first visit is a good time to talk about any questions or concerns you have about pregnancy. ?Medical history ?At your visit, you and your health care provider will talk about your medical history, including: ?Any past pregnancies. ?Your family's medical history. ?Medical history of the baby's father. ?Any long-term (chronic) health conditions you have and how you manage them. ?Any surgeries or procedures you have had. ?Any current over-the-counter or prescription medicines, herbs, or supplements that you are taking. ?Other factors that could pose a risk to your baby, including: ?Exposure to harmful chemicals or radiation at work or at home. ?Any substance use, including tobacco, alcohol, and drug use. ?Your home setting and your stress levels, including: ?Exposure to abuse or violence. ?Household financial strain. ?Your daily health habits, including diet and  exercise. ?Tests and screenings ?Your health care provider will: ?Measure your weight, height, and blood pressure. ?Do a physical exam, including a pelvic and breast exam. ?Perform blood tests and urine tests to check for: ?Urinary tract infection. ?Sexually transmitted infections (STIs). ?Low iron levels in your blood (anemia). ?Blood type and certain proteins on red blood cells (Rh antibodies). ?Infections and immunity to viruses, such as hepatitis B and rubella. ?HIV (human immunodeficiency virus). ?Discuss your options for genetic screening. ?Tips about staying healthy ?Your health care provider will also give you information about how to keep yourself and your baby healthy, including: ?Nutrition and taking vitamins. ?Physical activity. ?How to manage pregnancy symptoms such as nausea and vomiting (morning sickness). ?Infections and substances that may be harmful to your baby and how to avoid them. ?Food safety. ?Dental care. ?Working. ?Travel. ?Warning signs to watch for and when to call your health care provider. ?How often will I have prenatal care visits? ?After your first prenatal care visit, you will have regular visits throughout your pregnancy. The visit schedule is often as follows: ?Up to week 28 of pregnancy: once every 4 weeks. ?28-36 weeks: once every 2 weeks. ?After 36 weeks: every week until delivery. ?Some women may have visits more or less often depending on any underlying health conditions and the health of the baby. ?Keep all follow-up and prenatal care visits. This is important. ?What happens during routine prenatal care visits? ?Your health care provider will: ?Measure your weight and blood pressure. ?Check for fetal heart sounds. ?Measure the height of your uterus in your abdomen (fundal height). This may be measured starting around week 20 of pregnancy. ?Check the position of your baby inside your uterus. ?Ask questions   about your diet, sleeping patterns, and whether you can feel the baby  move. ?Review warning signs to watch for and signs of labor. ?Ask about any pregnancy symptoms you are having and how you are dealing with them. Symptoms may include: ?Headaches. ?Nausea and vomiting. ?Vaginal discharge. ?Swelling. ?Fatigue. ?Constipation. ?Changes in your vision. ?Feeling persistently sad or anxious. ?Any discomfort, including back or pelvic pain. ?Bleeding or spotting. ?Make a list of questions to ask your health care provider at your routine visits. ?What tests might I have during prenatal care visits? ?You may have blood, urine, and imaging tests throughout your pregnancy, such as: ?Urine tests to check for glucose, protein, or signs of infection. ?Glucose tests to check for a form of diabetes that can develop during pregnancy (gestational diabetes mellitus). This is usually done around week 24 of pregnancy. ?Ultrasounds to check your baby's growth and development, to check for birth defects, and to check your baby's well-being. These can also help to decide when you should deliver your baby. ?A test to check for group B strep (GBS) infection. This is usually done around week 36 of pregnancy. ?Genetic testing. This may include blood, fluid, or tissue sampling, or imaging tests, such as an ultrasound. Some genetic tests are done during the first trimester and some are done during the second trimester. ?What else can I expect during prenatal care visits? ?Your health care provider may recommend getting certain vaccines during pregnancy. These may include: ?A yearly flu shot (annual influenza vaccine). This is especially important if you will be pregnant during flu season. ?Tdap (tetanus, diphtheria, pertussis) vaccine. Getting this vaccine during pregnancy can protect your baby from whooping cough (pertussis) after birth. This vaccine may be recommended between weeks 27 and 36 of pregnancy. ?A COVID-19 vaccine. ?Later in your pregnancy, your health care provider may give you information  about: ?Childbirth and breastfeeding classes. ?Choosing a health care provider for your baby. ?Umbilical cord banking. ?Breastfeeding. ?Birth control after your baby is born. ?The hospital labor and delivery unit and how to set up a tour. ?Registering at the hospital before you go into labor. ?Where to find more information ?Office on Women's Health: womenshealth.gov ?American Pregnancy Association: americanpregnancy.org ?March of Dimes: marchofdimes.org ?Summary ?Prenatal care helps you and your baby stay as healthy as possible during pregnancy. ?Your first prenatal care visit will most likely be the longest. ?You will have visits and tests throughout your pregnancy to monitor your health and your baby's health. ?Bring a list of questions to your visits to ask your health care provider. ?Make sure to keep all follow-up and prenatal care visits. ?This information is not intended to replace advice given to you by your health care provider. Make sure you discuss any questions you have with your health care provider. ?Document Revised: 11/16/2019 Document Reviewed: 11/16/2019 ?Elsevier Patient Education ? 2022 Elsevier Inc. ? ?

## 2021-03-10 NOTE — Progress Notes (Signed)
Subjective:    Ann Deleon is a 26 y.o. female who presents for evaluation of amenorrhea. She believes she could be pregnant. Pregnancy is desired. Sexual Activity: single partner, contraception: none. Current symptoms also include: breast tenderness, fatigue, and nausea. Last period was normal.   Patient's last menstrual period was 02/03/2021. The following portions of the patient's history were reviewed and updated as appropriate: allergies, current medications, past family history, past medical history, past social history, past surgical history, and problem list.  Review of Systems Pertinent items are noted in HPI.     Objective:    BP (!) 148/84    Pulse 64    Ht 5\' 8"  (1.727 m)    Wt 256 lb (116.1 kg)    LMP 02/03/2021    SpO2 96%    BMI 38.92 kg/m  General: alert, cooperative, appears stated age, no distress, and no acute distress    Lab Review Urine HCG: positive    Assessment:    Absence of menstruation.     Plan:    Pregnancy Test:  Positive: EDC: 11/10/2021. Briefly discussed pre-natal care options.MD and Midwifery care reviewed. Plans to see midwives.   Encouraged well-balanced diet, plenty of rest when needed, pre-natal vitamins daily and walking for exercise. Discussed self-help for nausea, avoiding OTC medications until consulting provider or pharmacist, other than Tylenol as needed, minimal caffeine (1-2 cups daily) and avoiding alcohol. She will schedule u/s for dating 2 wks, Nurse visit in 5 wks and  her initial OB visit with Me in 7 wks. . Feel free to call with any questions.   Philip Aspen, CNM

## 2021-03-11 ENCOUNTER — Encounter: Payer: Self-pay | Admitting: Certified Nurse Midwife

## 2021-03-17 ENCOUNTER — Encounter: Payer: Self-pay | Admitting: Certified Nurse Midwife

## 2021-03-25 ENCOUNTER — Encounter: Payer: Self-pay | Admitting: Certified Nurse Midwife

## 2021-03-25 ENCOUNTER — Other Ambulatory Visit: Payer: Self-pay

## 2021-03-25 ENCOUNTER — Ambulatory Visit (INDEPENDENT_AMBULATORY_CARE_PROVIDER_SITE_OTHER): Payer: Self-pay

## 2021-03-25 DIAGNOSIS — Z3A01 Less than 8 weeks gestation of pregnancy: Secondary | ICD-10-CM

## 2021-03-25 DIAGNOSIS — Z3200 Encounter for pregnancy test, result unknown: Secondary | ICD-10-CM

## 2021-03-25 DIAGNOSIS — Z3401 Encounter for supervision of normal first pregnancy, first trimester: Secondary | ICD-10-CM

## 2021-04-03 ENCOUNTER — Encounter: Payer: Self-pay | Admitting: Certified Nurse Midwife

## 2021-04-16 ENCOUNTER — Encounter: Payer: Self-pay | Admitting: Certified Nurse Midwife

## 2021-04-18 ENCOUNTER — Other Ambulatory Visit: Payer: Self-pay

## 2021-04-18 ENCOUNTER — Ambulatory Visit (INDEPENDENT_AMBULATORY_CARE_PROVIDER_SITE_OTHER): Payer: Managed Care, Other (non HMO) | Admitting: Certified Nurse Midwife

## 2021-04-18 ENCOUNTER — Other Ambulatory Visit (HOSPITAL_COMMUNITY)
Admission: RE | Admit: 2021-04-18 | Discharge: 2021-04-18 | Disposition: A | Discharge status: Home / Self Care | Payer: Managed Care, Other (non HMO) | Source: Ambulatory Visit | Attending: Certified Nurse Midwife | Admitting: Certified Nurse Midwife

## 2021-04-18 ENCOUNTER — Encounter: Payer: Self-pay | Admitting: Emergency Medicine

## 2021-04-18 ENCOUNTER — Emergency Department
Admission: EM | Admit: 2021-04-18 | Discharge: 2021-04-19 | Disposition: A | Discharge status: Home / Self Care | Payer: Managed Care, Other (non HMO) | Attending: Emergency Medicine | Admitting: Emergency Medicine

## 2021-04-18 VITALS — BP 146/83 | HR 93 | Ht 68.0 in | Wt 264.2 lb

## 2021-04-18 DIAGNOSIS — N898 Other specified noninflammatory disorders of vagina: Secondary | ICD-10-CM | POA: Insufficient documentation

## 2021-04-18 DIAGNOSIS — Z3A10 10 weeks gestation of pregnancy: Secondary | ICD-10-CM

## 2021-04-18 DIAGNOSIS — O99511 Diseases of the respiratory system complicating pregnancy, first trimester: Secondary | ICD-10-CM | POA: Insufficient documentation

## 2021-04-18 DIAGNOSIS — Z0283 Encounter for blood-alcohol and blood-drug test: Secondary | ICD-10-CM | POA: Diagnosis not present

## 2021-04-18 DIAGNOSIS — Z113 Encounter for screening for infections with a predominantly sexual mode of transmission: Secondary | ICD-10-CM

## 2021-04-18 DIAGNOSIS — O210 Mild hyperemesis gravidarum: Secondary | ICD-10-CM | POA: Insufficient documentation

## 2021-04-18 DIAGNOSIS — R102 Pelvic and perineal pain unspecified side: Secondary | ICD-10-CM

## 2021-04-18 DIAGNOSIS — D72829 Elevated white blood cell count, unspecified: Secondary | ICD-10-CM | POA: Insufficient documentation

## 2021-04-18 DIAGNOSIS — Z3401 Encounter for supervision of normal first pregnancy, first trimester: Secondary | ICD-10-CM

## 2021-04-18 DIAGNOSIS — O99111 Other diseases of the blood and blood-forming organs and certain disorders involving the immune mechanism complicating pregnancy, first trimester: Secondary | ICD-10-CM | POA: Diagnosis not present

## 2021-04-18 DIAGNOSIS — O99210 Obesity complicating pregnancy, unspecified trimester: Secondary | ICD-10-CM

## 2021-04-18 DIAGNOSIS — J45909 Unspecified asthma, uncomplicated: Secondary | ICD-10-CM | POA: Insufficient documentation

## 2021-04-18 DIAGNOSIS — O26891 Other specified pregnancy related conditions, first trimester: Secondary | ICD-10-CM

## 2021-04-18 DIAGNOSIS — R109 Unspecified abdominal pain: Secondary | ICD-10-CM

## 2021-04-18 LAB — URINALYSIS, ROUTINE W REFLEX MICROSCOPIC
Bacteria, UA: NONE SEEN
Bilirubin Urine: NEGATIVE
Glucose, UA: NEGATIVE mg/dL
Ketones, ur: NEGATIVE mg/dL
Nitrite: NEGATIVE
Protein, ur: NEGATIVE mg/dL
Specific Gravity, Urine: 1.004 — ABNORMAL LOW (ref 1.005–1.030)
pH: 8 (ref 5.0–8.0)

## 2021-04-18 LAB — COMPREHENSIVE METABOLIC PANEL
ALT: 14 U/L (ref 0–44)
AST: 18 U/L (ref 15–41)
Albumin: 3.6 g/dL (ref 3.5–5.0)
Alkaline Phosphatase: 64 U/L (ref 38–126)
Anion gap: 7 (ref 5–15)
BUN: 9 mg/dL (ref 6–20)
CO2: 24 mmol/L (ref 22–32)
Calcium: 9.1 mg/dL (ref 8.9–10.3)
Chloride: 105 mmol/L (ref 98–111)
Creatinine, Ser: 0.56 mg/dL (ref 0.44–1.00)
GFR, Estimated: >60 mL/min (ref >60)
Glucose, Bld: 116 mg/dL — ABNORMAL HIGH (ref 70–99)
Potassium: 3.8 mmol/L (ref 3.5–5.1)
Sodium: 136 mmol/L (ref 135–145)
Total Bilirubin: 0.2 mg/dL — ABNORMAL LOW (ref 0.3–1.2)
Total Protein: 7.2 g/dL (ref 6.5–8.1)

## 2021-04-18 LAB — CBC
HCT: 38 % (ref 36.0–46.0)
Hemoglobin: 12.6 g/dL (ref 12.0–15.0)
MCH: 29 pg (ref 26.0–34.0)
MCHC: 33.2 g/dL (ref 30.0–36.0)
MCV: 87.6 fL (ref 80.0–100.0)
Platelets: 250 10*3/uL (ref 150–400)
RBC: 4.34 MIL/uL (ref 3.87–5.11)
RDW: 12.4 % (ref 11.5–15.5)
WBC: 12.6 10*3/uL — ABNORMAL HIGH (ref 4.0–10.5)
nRBC: 0 % (ref 0.0–0.2)

## 2021-04-18 LAB — LIPASE, BLOOD: Lipase: 36 U/L (ref 11–51)

## 2021-04-18 NOTE — Progress Notes (Signed)
[address] presents for [place] nurse interview visit. Pregnancy confirmation done 03/10/21.  G1. Pregnancy education material explained and given. NOB labs ordered. (TSH/HbgA1c due to Increased BMI. Body mass index is 40.17 kg/m?Marland Kitchen HIV labs and Drug screen were explained optional and she did not decline. Drug screen ordered. PNV encouraged. Genetic screening options discussed. Genetic testing: PANORAMA, Pt to discuss with provider.  Financial policy reviewed. FMLA from reviewed and signed. Pt. To follow up with provider in 2 weeks for NOB physical. Pt requested self swab for BV complaining of vaginal discharge/itching. All questions answered. ?  ?

## 2021-04-18 NOTE — ED Triage Notes (Signed)
Pt arrived via POV with reports L side lower abd pain, described the pain as a pushing pain.  Pt is 10 weeks G1P0. Denies any bleeding. Pt goes to [facility]. Pt reports she has BV but when she was at her nurse visit no medications were prescribed.  Pt also has been complaining of nausea and does not have any nausea meds.  ? ? ?

## 2021-04-19 ENCOUNTER — Emergency Department: Payer: Managed Care, Other (non HMO)

## 2021-04-19 LAB — URINALYSIS, ROUTINE W REFLEX MICROSCOPIC
Bilirubin, UA: NEGATIVE
Glucose, UA: NEGATIVE
Ketones, UA: NEGATIVE
Nitrite, UA: NEGATIVE
Specific Gravity, UA: 1.025 (ref 1.005–1.030)
Urobilinogen, Ur: 0.2 mg/dL (ref 0.2–1.0)
pH, UA: 6.5 (ref 5.0–7.5)

## 2021-04-19 LAB — MICROSCOPIC EXAMINATION
Casts: NONE SEEN /lpf
Epithelial Cells (non renal): >10 /hpf — AB (ref 0–10)

## 2021-04-19 MED ORDER — ONDANSETRON 4 MG PO TBDP: 4.0000 mg | ORAL_TABLET | Freq: Three times a day (TID) | ORAL | 0 refills | Status: DC | PRN

## 2021-04-19 NOTE — ED Provider Notes (Signed)
[facility] Provider Note    Event Date/Time   First MD Initiated Contact with Patient 04/18/21 2358     (approximate)   History   Abdominal Pain   HPI  Ann Deleon is a 26 y.o. female  G1P0 at [redacted] weeks GA who presents for evaluation of left lower quadrant abdominal pain.  Patient reports that she started having the pain 2 days ago.  The pain is intermittent, she describes it as a pulling or pushing sensation on the left lower quadrant.  No vaginal bleeding, no vaginal discharge, no dysuria or hematuria, no vomiting or diarrhea, no constipation.  She has had nausea but that has been throughout her whole pregnancy.  She reports seeing her OB but was not given anything for nausea.  She denies vomiting.     Past Medical History:  Diagnosis Date   Anxiety    Asthma    GERD (gastroesophageal reflux disease)    Sexually active at young age     Past Surgical History:  Procedure Laterality Date   MANDIBLE SURGERY       Physical Exam   Triage Vital Signs: ED Triage Vitals  Enc Vitals Group     BP 04/18/21 2128 (!) 153/92     Pulse Rate 04/18/21 2128 (!) 124     Resp 04/18/21 2128 18     Temp 04/18/21 2128 98.3 F (36.8 C)     Temp Source 04/18/21 2128 Oral     SpO2 04/18/21 2128 96 %     Weight 04/18/21 2139 264 lb 3.2 oz (119.8 kg)     Height 04/18/21 2139 5\' 8"  (1.727 m)     Head Circumference --      Peak Flow --      Pain Score 04/18/21 2138 6     Pain Loc --      Pain Edu? --      Excl. in [place]? --     Most recent vital signs: Vitals:   04/18/21 2128 04/19/21 0104  BP: (!) 153/92 139/65  Pulse: (!) 124 99  Resp: 18 18  Temp: 98.3 F (36.8 C) 98.4 F (36.9 C)  SpO2: 96% 98%     Constitutional: Alert and oriented. Well appearing and in no apparent distress. HEENT:      Head: Normocephalic and atraumatic.         Eyes: Conjunctivae are normal. Sclera is non-icteric.       Mouth/Throat: Mucous membranes are moist.        Neck: Supple with no signs of meningismus. Cardiovascular: Regular rate and rhythm. No murmurs, gallops, or rubs. 2+ symmetrical distal pulses are present in all extremities.  Respiratory: Normal respiratory effort. Lungs are clear to auscultation bilaterally.  Gastrointestinal: Soft, very mild tenderness to palpation in the left lower quadrant, and non distended with positive bowel sounds. No rebound or guarding. Genitourinary: No CVA tenderness. Musculoskeletal:  No edema, cyanosis, or erythema of extremities. Neurologic: Normal speech and language. Face is symmetric. Moving all extremities. No gross focal neurologic deficits are appreciated. Skin: Skin is warm, dry and intact. No rash noted. Psychiatric: Mood and affect are normal. Speech and behavior are normal.  ED Results / Procedures / Treatments   Labs (all labs ordered are listed, but only abnormal results are displayed) Labs Reviewed  COMPREHENSIVE METABOLIC PANEL - Abnormal; Notable for the following components:      Result Value   Glucose, Bld 116 (*)  Total Bilirubin 0.2 (*)    All other components within normal limits  CBC - Abnormal; Notable for the following components:   WBC 12.6 (*)    All other components within normal limits  URINALYSIS, ROUTINE W REFLEX MICROSCOPIC - Abnormal; Notable for the following components:   Color, Urine STRAW (*)    APPearance CLEAR (*)    Specific Gravity, Urine 1.004 (*)    Hgb urine dipstick MODERATE (*)    Leukocytes,Ua TRACE (*)    All other components within normal limits  LIPASE, BLOOD  HCG, QUANTITATIVE, PREGNANCY     EKG  none   RADIOLOGY I, Rudene Re, attending MD, have personally viewed and interpreted the images obtained during this visit as below:  Ultrasound showing normal IUP with normal ovary   ___________________________________________________ Interpretation by Radiologist:  US OB Comp Less 14 Wks  Result Date: 04/19/2021 CLINICAL DATA:   Left-sided pelvic pain EXAM: OBSTETRIC <14 WK [place] US DOPPLER ULTRASOUND OF OVARIES TECHNIQUE: Both transabdominal and transvaginal ultrasound examinations were performed for complete evaluation of the gestation as well as the maternal uterus, adnexal regions, and pelvic cul-de-sac. Transvaginal technique was performed to assess early pregnancy. Color and duplex Doppler ultrasound was utilized to evaluate blood flow to the ovaries. COMPARISON:  None. FINDINGS: Intrauterine gestational sac: Present Yolk sac:  Present Embryo:  Present Cardiac Activity: Present Heart Rate: 173 bpm CRL:   36 mm   10 w 4d                  [place] EDC: 11/11/2021 Subchorionic hemorrhage:  None visualized. Maternal uterus/adnexae: Ovaries are within normal limits bilaterally. No evidence of ovarian torsion is seen. Pulsed Doppler evaluation of both ovaries demonstrates normal appearing low-resistance arterial and venous waveforms. IMPRESSION: Single live intrauterine gestation at 10 weeks 4 days. No acute abnormality noted. Electronically Signed   By: Inez Catalina M.D.   On: 04/19/2021 01:58   US ABDOMINAL PELVIC ART/VENT FLOW DOPPLER  Result Date: 04/19/2021 CLINICAL DATA:  Left-sided pelvic pain EXAM: OBSTETRIC <14 WK [place] US DOPPLER ULTRASOUND OF OVARIES TECHNIQUE: Both transabdominal and transvaginal ultrasound examinations were performed for complete evaluation of the gestation as well as the maternal uterus, adnexal regions, and pelvic cul-de-sac. Transvaginal technique was performed to assess early pregnancy. Color and duplex Doppler ultrasound was utilized to evaluate blood flow to the ovaries. COMPARISON:  None. FINDINGS: Intrauterine gestational sac: Present Yolk sac:  Present Embryo:  Present Cardiac Activity: Present Heart Rate: 173 bpm CRL:   36 mm   10 w 4d                  [place] EDC: 11/11/2021 Subchorionic hemorrhage:  None visualized. Maternal uterus/adnexae: Ovaries are within normal limits bilaterally. No evidence of ovarian  torsion is seen. Pulsed Doppler evaluation of both ovaries demonstrates normal appearing low-resistance arterial and venous waveforms. IMPRESSION: Single live intrauterine gestation at 10 weeks 4 days. No acute abnormality noted. Electronically Signed   By: Inez Catalina M.D.   On: 04/19/2021 01:58      PROCEDURES:  Critical Care performed: No  Procedures    IMPRESSION / MDM / ASSESSMENT AND PLAN / ED COURSE  I reviewed the triage vital signs and the nursing notes.  26 y.o. female  G1P0 at [redacted] weeks GA who presents for evaluation of left lower quadrant abdominal pain.  Patient is well-appearing in no distress with normal vital signs.  Abdomen soft with very mild tenderness to palpation in  the left lower quadrant with no rebound or guarding.  Ddx: Ovarian pathology versus round ligament pain versus UTI versus ectopic pregnancy   Plan: Ultrasound, UA, CBC, CMP, lipase   MEDICATIONS GIVEN IN ED: Medications - No data to display   ED COURSE: Ultrasound showing normal ovaries with no acute pathology and a single live IUP measuring 10 weeks and 4 days.  UA is negative for urinary tract infection.  Mildly elevated white count which is expected during pregnancy.  Normal LFTs and lipase, no significant electrolyte derangements.  Pain is most likely round ligament.  Discussed Tylenol as needed and follow-up with her OB/GYN.  Zofran was provided for nausea during pregnancy.  Admission was considered but with negative work-up I felt that it was safe for patient to be discharged.  We discussed my standard return precautions.   Consults: None   EMR reviewed including recent visit with her primary care doctor for her pregnancy from yesterday    FINAL CLINICAL IMPRESSION(S) / ED DIAGNOSES   Final diagnoses:  Abdominal pain during pregnancy in first trimester     Rx / DC Orders   ED Discharge Orders          Ordered    ondansetron (ZOFRAN-ODT) 4 MG disintegrating tablet  Every 8 hours  PRN        04/19/21 0214             Note:  This document was prepared using Dragon voice recognition software and may include unintentional dictation errors.   Please note:  Patient was evaluated in Emergency Department today for the symptoms described in the history of present illness. Patient was evaluated in the context of the global COVID-19 pandemic, which necessitated consideration that the patient might be at risk for infection with the SARS-CoV-2 virus that causes COVID-19. Institutional protocols and algorithms that pertain to the evaluation of patients at risk for COVID-19 are in a state of rapid change based on information released by regulatory bodies including the CDC and federal and state organizations. These policies and algorithms were followed during the patient's care in the ED.  Some ED evaluations and interventions may be delayed as a result of limited staffing during the pandemic.       Alfred Levins, [place], MD 04/19/21 [phone]

## 2021-04-20 LAB — CULTURE, OB URINE

## 2021-04-20 LAB — GC/CHLAMYDIA PROBE AMP
Chlamydia trachomatis, NAA: NEGATIVE
Neisseria Gonorrhoeae by PCR: NEGATIVE

## 2021-04-20 LAB — URINE CULTURE, OB REFLEX

## 2021-04-21 LAB — MONITOR DRUG PROFILE 14(MW)
Amphetamine Scrn, Ur: NEGATIVE ng/mL
BARBITURATE SCREEN URINE: NEGATIVE ng/mL
BENZODIAZEPINE SCREEN, URINE: NEGATIVE ng/mL
Buprenorphine, Urine: NEGATIVE ng/mL
CANNABINOIDS UR QL SCN: NEGATIVE ng/mL
Cocaine (Metab) Scrn, Ur: NEGATIVE ng/mL
Creatinine(Crt), U: 197.3 mg/dL (ref 20.0–300.0)
Fentanyl, Urine: NEGATIVE pg/mL
Meperidine Screen, Urine: NEGATIVE ng/mL
Methadone Screen, Urine: NEGATIVE ng/mL
OXYCODONE+OXYMORPHONE UR QL SCN: NEGATIVE ng/mL
Opiate Scrn, Ur: NEGATIVE ng/mL
Ph of Urine: 6.2 (ref 4.5–8.9)
Phencyclidine Qn, Ur: NEGATIVE ng/mL
Propoxyphene Scrn, Ur: NEGATIVE ng/mL
SPECIFIC GRAVITY: 1.027
Tramadol Screen, Urine: NEGATIVE ng/mL

## 2021-04-21 LAB — NICOTINE SCREEN, URINE: Cotinine Ql Scrn, Ur: NEGATIVE ng/mL

## 2021-04-22 ENCOUNTER — Encounter: Payer: Self-pay | Admitting: Certified Nurse Midwife

## 2021-04-22 LAB — HCV INTERPRETATION

## 2021-04-22 LAB — TOXOPLASMA ANTIBODIES- IGG AND  IGM
Toxoplasma Antibody- IgM: <3 AU/mL (ref 0.0–7.9)
Toxoplasma IgG Ratio: <3 IU/mL (ref 0.0–7.1)

## 2021-04-22 LAB — VIRAL HEPATITIS HBV, HCV
HCV Ab: NONREACTIVE
Hep B Core Total Ab: NEGATIVE
Hep B Surface Ab, Qual: NONREACTIVE
Hepatitis B Surface Ag: NEGATIVE

## 2021-04-22 LAB — RUBELLA SCREEN: Rubella Antibodies, IGG: 3.15 index (ref >0.99)

## 2021-04-22 LAB — ABO AND RH: Rh Factor: POSITIVE

## 2021-04-22 LAB — RPR: RPR Ser Ql: NONREACTIVE

## 2021-04-22 LAB — HEMOGLOBIN A1C
Est. average glucose Bld gHb Est-mCnc: 103 mg/dL
Hgb A1c MFr Bld: 5.2 % (ref 4.8–5.6)

## 2021-04-22 LAB — HIV ANTIBODY (ROUTINE TESTING W REFLEX): HIV Screen 4th Generation wRfx: NONREACTIVE

## 2021-04-22 LAB — VARICELLA ZOSTER ANTIBODY, IGG: Varicella zoster IgG: 455 index (ref >165)

## 2021-04-22 LAB — ANTIBODY SCREEN: Antibody Screen: NEGATIVE

## 2021-04-22 LAB — TSH: TSH: 1.9 u[IU]/mL (ref 0.450–4.500)

## 2021-04-23 LAB — CERVICOVAGINAL ANCILLARY ONLY
Bacterial Vaginitis (gardnerella): NEGATIVE
Candida Glabrata: NEGATIVE
Candida Vaginitis: NEGATIVE
Comment: NEGATIVE
Comment: NEGATIVE
Comment: NEGATIVE

## 2021-04-29 ENCOUNTER — Other Ambulatory Visit: Payer: Self-pay

## 2021-04-29 ENCOUNTER — Ambulatory Visit (INDEPENDENT_AMBULATORY_CARE_PROVIDER_SITE_OTHER): Payer: Managed Care, Other (non HMO) | Admitting: Certified Nurse Midwife

## 2021-04-29 ENCOUNTER — Encounter: Payer: Self-pay | Admitting: Certified Nurse Midwife

## 2021-04-29 VITALS — BP 137/84 | HR 85 | Wt 266.4 lb

## 2021-04-29 DIAGNOSIS — Z3401 Encounter for supervision of normal first pregnancy, first trimester: Secondary | ICD-10-CM

## 2021-04-29 DIAGNOSIS — Z3A12 12 weeks gestation of pregnancy: Secondary | ICD-10-CM

## 2021-04-29 DIAGNOSIS — Z1379 Encounter for other screening for genetic and chromosomal anomalies: Secondary | ICD-10-CM

## 2021-04-29 MED ORDER — DOXYLAMINE-PYRIDOXINE 10-10 MG PO TBEC: 1.0000 | DELAYED_RELEASE_TABLET | Freq: Four times a day (QID) | ORAL | 5 refills | Status: DC

## 2021-04-29 MED ORDER — ASPIRIN EC 81 MG PO TBEC: 81.0000 mg | DELAYED_RELEASE_TABLET | Freq: Every day | ORAL | 11 refills | Status: DC

## 2021-04-29 NOTE — Progress Notes (Signed)
NEW OB HISTORY AND PHYSICAL ? ?SUBJECTIVE:  ?    ? Ann Deleon is a 26 y.o. G38P0000 female, Patient's last menstrual period was 02/03/2021 (exact date)., Estimated Date of Delivery: 11/10/21, [redacted]w[redacted]d, presents today for establishment of Prenatal Care. She has complaints of nausea without vomiting for several  days.  ? 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  ?  ?Gynecologic History ?Patient's last menstrual period was 02/03/2021 (exact date). Normal ?Contraception: none ?Last Pap: 04/11/2019. Results were: normal ? ?Obstetric History ?OB History  ?Gravida Para Term Preterm AB Living  ?1 0 0 0 0 0  ?SAB IAB Ectopic Multiple Live Births  ?0 0 0 0 0  ?  ?# Outcome Date GA Lbr Len/2nd Weight Sex Delivery Anes PTL Lv  ?1 Current           ? ? ?Past Medical History:  ?Diagnosis Date  ? Anxiety   ? Asthma   ? GERD (gastroesophageal reflux disease)   ? Sexually active at young age   ? ? ?Past Surgical History:  ?Procedure Laterality Date  ? MANDIBLE SURGERY    ? ? ?Current Outpatient Medications on File Prior to Visit  ?Medication Sig Dispense Refill  ? albuterol (PROVENTIL) (2.5 MG/3ML) 0.083% nebulizer solution Take 3 mLs (2.5 mg total) by nebulization every 6 (six) hours as needed for wheezing or shortness of breath. 150 mL 2  ? ondansetron (ZOFRAN-ODT) 4 MG disintegrating tablet Take 1 tablet (4 mg total) by mouth every 8 (eight) hours as needed for nausea or vomiting. 20 tablet 0  ? Prenatal Vit-Fe Fumarate-FA (PRENATAL PO) Take by mouth.    ? albuterol (PROVENTIL HFA;VENTOLIN HFA) 108 (90 BASE) MCG/ACT inhaler Inhale 2 puffs into the lungs 4 (four) times daily as needed. (Patient not taking: Reported on 04/18/2021)    ? busPIRone (BUSPAR) 5 MG tablet Take 1 tablet by mouth 2 (two) times daily. (Patient not taking: Reported on 03/10/2021)    ? famciclovir (FAMVIR) 500 MG tablet Take 500 mg by mouth 2 (two) times daily. (Patient  not taking: Reported on 03/10/2021)    ? predniSONE (DELTASONE) 50 MG tablet 1 tablet daily x 5 days (Patient not taking: Reported on 03/10/2021) 5 tablet 0  ? promethazine-dextromethorphan (PROMETHAZINE-DM) 6.25-15 MG/5ML syrup Take 5 mLs by mouth 4 (four) times daily as needed for cough. (Patient not taking: Reported on 03/10/2021) 118 mL 0  ? propranolol (INDERAL) 10 MG tablet Take by mouth. (Patient not taking: Reported on 03/10/2021)    ? Spacer/Aero-Holding Chambers (AEROCHAMBER PLUS WITH MASK) inhaler Use as instructed (Patient not taking: Reported on 03/10/2021) 1 each 1  ? valACYclovir (VALTREX) 500 MG tablet Take 1 tablet (500 mg total) by mouth daily. For cold sore prevention (Patient not taking: Reported on 03/10/2021) 90 tablet 1  ? ?No current facility-administered medications on file prior to visit.  ? ? ?No Known Allergies ? ?Social History  ? ?Socioeconomic History  ? Marital status: Married  ?  Spouse name: Not on file  ? Number of children: Not on file  ? Years of education: Not on file  ? Highest education level: Not on file  ?Occupational History  ? Not on file  ?Tobacco Use  ? Smoking status: Never  ? Smokeless tobacco: Never  ?Vaping Use  ? Vaping Use: Never used  ?Substance and Sexual Activity  ? Alcohol use: Not Currently  ? Drug use:  No  ? Sexual activity: Yes  ?  Birth control/protection: Condom  ?Other Topics Concern  ? Not on file  ?Social History Narrative  ? Not on file  ? ?Social Determinants of Health  ? ?Financial Resource Strain: Not on file  ?Food Insecurity: Not on file  ?Transportation Needs: Not on file  ?Physical Activity: Not on file  ?Stress: Not on file  ?Social Connections: Not on file  ?Intimate Partner Violence: Not on file  ? ? ?Family History  ?Problem Relation Age of Onset  ? Hypertension Mother   ? Depression Mother   ? Anxiety disorder Mother   ? Ovarian cysts Mother   ? Fibroids Mother   ? Hypertension Father   ? Diabetes Father   ? Nephrolithiasis Father   ? Depression  Father   ? Breast cancer Neg Hx   ? Ovarian cancer Neg Hx   ? Colon cancer Neg Hx   ? ? ?The following portions of the patient's history were reviewed and updated as appropriate: allergies, current medications, past OB history, past medical history, past surgical history, past family history, past social history, and problem list. ? ? ? ?OBJECTIVE: ?Initial Physical Exam (New OB) ? ?GENERAL APPEARANCE: alert, well appearing, in no apparent distress, oriented to person, place and time, overweight ?HEAD: normocephalic, atraumatic ?MOUTH: mucous membranes moist, pharynx normal without lesions ?THYROID: no thyromegaly or masses present ?BREASTS: no masses noted, no significant tenderness, no palpable axillary nodes, no skin changes, inverted nipples ?LUNGS: clear to auscultation, no wheezes, rales or rhonchi, symmetric air entry ?HEART: regular rate and rhythm, no murmurs ?ABDOMEN: soft, nontender, nondistended, no abnormal masses, no epigastric pain, obese, and FHT present ?EXTREMITIES: no redness or tenderness in the calves or thighs, no edema, no limitation in range of motion, intact peripheral pulses ?SKIN: normal coloration and turgor, no rashes ?LYMPH NODES: no adenopathy palpable ?NEUROLOGIC: alert, oriented, normal speech, no focal findings or movement disorder noted ? ?PELVIC EXAM EXTERNAL GENITALIA: normal appearing vulva with no masses, tenderness or lesions ?VAGINA: no abnormal discharge or lesions ?CERVIX: no lesions or cervical motion tenderness ?UTERUS: gravid ?ADNEXA: no masses palpable and nontender ?OB EXAM PELVIMETRY: appears adequate ?RECTUM: exam not indicated ? ?ASSESSMENT: ?Normal pregnancy ? ?PLAN: ?Prenatal care ?See ordersNew OB counseling: ?The patient has been given an overview regarding routine prenatal care. Recommendations regarding diet, weight gain, and exercise in pregnancy were given. ?Prenatal testing, optional genetic testing, carrier screening and ultrasound use in pregnancy were  reviewed. Panorama today.  ?Benefits of Breast Feeding were discussed. The patient is encouraged to consider nursing her baby post partum.   ?Orders for Asprin and dicligis. Follow up 4 wks with Missy for ROB.  ? ?Doreene Burke, CNM  ?

## 2021-04-29 NOTE — Patient Instructions (Signed)
Prenatal Care ?Prenatal care is health care during pregnancy. It helps you and your unborn baby (fetus) stay as healthy as possible. Prenatal care may be provided by a midwife, a family practice doctor, a mid-level practitioner (nurse practitioner or physician assistant), or a childbirth and pregnancy doctor (obstetrician). ?How does this affect me? ?During pregnancy, you will be closely monitored for any new conditions that might develop. To lower your risk of pregnancy complications, you and your health care provider will talk about any underlying conditions you have. ?How does this affect my baby? ?Early and consistent prenatal care increases the chance that your baby will be healthy during pregnancy. Prenatal care lowers the risk that your baby will be: ?Born early (prematurely). ?Smaller than expected at birth (small for gestational age). ?What can I expect at the first prenatal care visit? ?Your first prenatal care visit will likely be the longest. You should schedule your first prenatal care visit as soon as you know that you are pregnant. Your first visit is a good time to talk about any questions or concerns you have about pregnancy. ?Medical history ?At your visit, you and your health care provider will talk about your medical history, including: ?Any past pregnancies. ?Your family's medical history. ?Medical history of the baby's father. ?Any long-term (chronic) health conditions you have and how you manage them. ?Any surgeries or procedures you have had. ?Any current over-the-counter or prescription medicines, herbs, or supplements that you are taking. ?Other factors that could pose a risk to your baby, including: ?Exposure to harmful chemicals or radiation at work or at home. ?Any substance use, including tobacco, alcohol, and drug use. ?Your home setting and your stress levels, including: ?Exposure to abuse or violence. ?Household financial strain. ?Your daily health habits, including diet and  exercise. ?Tests and screenings ?Your health care provider will: ?Measure your weight, height, and blood pressure. ?Do a physical exam, including a pelvic and breast exam. ?Perform blood tests and urine tests to check for: ?Urinary tract infection. ?Sexually transmitted infections (STIs). ?Low iron levels in your blood (anemia). ?Blood type and certain proteins on red blood cells (Rh antibodies). ?Infections and immunity to viruses, such as hepatitis B and rubella. ?HIV (human immunodeficiency virus). ?Discuss your options for genetic screening. ?Tips about staying healthy ?Your health care provider will also give you information about how to keep yourself and your baby healthy, including: ?Nutrition and taking vitamins. ?Physical activity. ?How to manage pregnancy symptoms such as nausea and vomiting (morning sickness). ?Infections and substances that may be harmful to your baby and how to avoid them. ?Food safety. ?Dental care. ?Working. ?Travel. ?Warning signs to watch for and when to call your health care provider. ?How often will I have prenatal care visits? ?After your first prenatal care visit, you will have regular visits throughout your pregnancy. The visit schedule is often as follows: ?Up to week 28 of pregnancy: once every 4 weeks. ?28-36 weeks: once every 2 weeks. ?After 36 weeks: every week until delivery. ?Some women may have visits more or less often depending on any underlying health conditions and the health of the baby. ?Keep all follow-up and prenatal care visits. This is important. ?What happens during routine prenatal care visits? ?Your health care provider will: ?Measure your weight and blood pressure. ?Check for fetal heart sounds. ?Measure the height of your uterus in your abdomen (fundal height). This may be measured starting around week 20 of pregnancy. ?Check the position of your baby inside your uterus. ?Ask questions   about your diet, sleeping patterns, and whether you can feel the baby  move. ?Review warning signs to watch for and signs of labor. ?Ask about any pregnancy symptoms you are having and how you are dealing with them. Symptoms may include: ?Headaches. ?Nausea and vomiting. ?Vaginal discharge. ?Swelling. ?Fatigue. ?Constipation. ?Changes in your vision. ?Feeling persistently sad or anxious. ?Any discomfort, including back or pelvic pain. ?Bleeding or spotting. ?Make a list of questions to ask your health care provider at your routine visits. ?What tests might I have during prenatal care visits? ?You may have blood, urine, and imaging tests throughout your pregnancy, such as: ?Urine tests to check for glucose, protein, or signs of infection. ?Glucose tests to check for a form of diabetes that can develop during pregnancy (gestational diabetes mellitus). This is usually done around week 24 of pregnancy. ?Ultrasounds to check your baby's growth and development, to check for birth defects, and to check your baby's well-being. These can also help to decide when you should deliver your baby. ?A test to check for group B strep (GBS) infection. This is usually done around week 36 of pregnancy. ?Genetic testing. This may include blood, fluid, or tissue sampling, or imaging tests, such as an ultrasound. Some genetic tests are done during the first trimester and some are done during the second trimester. ?What else can I expect during prenatal care visits? ?Your health care provider may recommend getting certain vaccines during pregnancy. These may include: ?A yearly flu shot (annual influenza vaccine). This is especially important if you will be pregnant during flu season. ?Tdap (tetanus, diphtheria, pertussis) vaccine. Getting this vaccine during pregnancy can protect your baby from whooping cough (pertussis) after birth. This vaccine may be recommended between weeks 27 and 36 of pregnancy. ?A COVID-19 vaccine. ?Later in your pregnancy, your health care provider may give you information  about: ?Childbirth and breastfeeding classes. ?Choosing a health care provider for your baby. ?Umbilical cord banking. ?Breastfeeding. ?Birth control after your baby is born. ?The hospital labor and delivery unit and how to set up a tour. ?Registering at the hospital before you go into labor. ?Where to find more information ?Office on Women's Health: womenshealth.gov ?American Pregnancy Association: americanpregnancy.org ?March of Dimes: marchofdimes.org ?Summary ?Prenatal care helps you and your baby stay as healthy as possible during pregnancy. ?Your first prenatal care visit will most likely be the longest. ?You will have visits and tests throughout your pregnancy to monitor your health and your baby's health. ?Bring a list of questions to your visits to ask your health care provider. ?Make sure to keep all follow-up and prenatal care visits. ?This information is not intended to replace advice given to you by your health care provider. Make sure you discuss any questions you have with your health care provider. ?Document Revised: 11/16/2019 Document Reviewed: 11/16/2019 ?Elsevier Patient Education ? 2022 Elsevier Inc. ? ?

## 2021-04-30 ENCOUNTER — Encounter: Payer: Self-pay | Admitting: Certified Nurse Midwife

## 2021-04-30 LAB — CBC
Hematocrit: 38.8 % (ref 34.0–46.6)
Hemoglobin: 13.5 g/dL (ref 11.1–15.9)
MCH: 30.1 pg (ref 26.6–33.0)
MCHC: 34.8 g/dL (ref 31.5–35.7)
MCV: 86 fL (ref 79–97)
Platelets: 285 10*3/uL (ref 150–450)
RBC: 4.49 x10E6/uL (ref 3.77–5.28)
RDW: 11.9 % (ref 11.7–15.4)
WBC: 9.9 10*3/uL (ref 3.4–10.8)

## 2021-05-12 ENCOUNTER — Telehealth: Payer: Self-pay | Admitting: Obstetrics

## 2021-05-12 NOTE — Telephone Encounter (Signed)
Pt called asking for lab results. Please advise

## 2021-05-13 NOTE — Telephone Encounter (Signed)
Have sent pt message in regards to genetic screening.  ?

## 2021-05-14 ENCOUNTER — Other Ambulatory Visit: Payer: Managed Care, Other (non HMO)

## 2021-05-14 ENCOUNTER — Other Ambulatory Visit: Payer: Self-pay

## 2021-05-19 ENCOUNTER — Encounter: Payer: Self-pay | Admitting: Certified Nurse Midwife

## 2021-05-26 ENCOUNTER — Encounter: Payer: Self-pay | Admitting: Certified Nurse Midwife

## 2021-05-26 ENCOUNTER — Ambulatory Visit (INDEPENDENT_AMBULATORY_CARE_PROVIDER_SITE_OTHER): Payer: Managed Care, Other (non HMO) | Admitting: Obstetrics

## 2021-05-26 ENCOUNTER — Encounter: Payer: Self-pay | Admitting: Obstetrics

## 2021-05-26 VITALS — BP 129/84 | HR 93 | Wt 266.5 lb

## 2021-05-26 DIAGNOSIS — R319 Hematuria, unspecified: Secondary | ICD-10-CM

## 2021-05-26 DIAGNOSIS — Z3402 Encounter for supervision of normal first pregnancy, second trimester: Secondary | ICD-10-CM

## 2021-05-26 DIAGNOSIS — Z3A16 16 weeks gestation of pregnancy: Secondary | ICD-10-CM

## 2021-05-26 DIAGNOSIS — Z1379 Encounter for other screening for genetic and chromosomal anomalies: Secondary | ICD-10-CM

## 2021-05-26 LAB — POCT URINALYSIS DIPSTICK OB
Bilirubin, UA: NEGATIVE
Glucose, UA: NEGATIVE
Ketones, UA: NEGATIVE
Leukocytes, UA: NEGATIVE
Nitrite, UA: NEGATIVE
POC,PROTEIN,UA: NEGATIVE
Spec Grav, UA: >=1.03 — AB (ref 1.010–1.025)
Urobilinogen, UA: 0.2 E.U./dL
pH, UA: 6 (ref 5.0–8.0)

## 2021-05-26 NOTE — Progress Notes (Signed)
ROB at [redacted]w[redacted]d. Reviewed EDC. Based on LMP, confirmed by early [place]. Maybe starting to feel some small movements. Discussed discomforts of second trimester. Genetic results not back yet. Wants AFP today. Urine culture sent. Anatomy US and ROB in 4 weeks. ? ?Guadlupe Spanish, CNM ?

## 2021-05-28 ENCOUNTER — Encounter: Payer: Managed Care, Other (non HMO) | Admitting: Obstetrics

## 2021-05-28 LAB — URINE CULTURE

## 2021-05-30 LAB — AFP, SERUM, OPEN SPINA BIFIDA
AFP MoM: 0.68
AFP Value: 16.9 ng/mL
Gest. Age on Collection Date: 16 weeks
Maternal Age At EDD: 24.7 yr
OSBR Risk 1 IN: 10000
Test Results:: NEGATIVE
Weight: 266 [lb_av]
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
## 2021-06-01 ENCOUNTER — Encounter: Payer: Self-pay | Admitting: Obstetrics

## 2021-06-02 ENCOUNTER — Encounter: Payer: Self-pay | Admitting: Certified Nurse Midwife

## 2021-06-03 ENCOUNTER — Encounter: Payer: Self-pay | Admitting: Certified Nurse Midwife

## 2021-06-04 ENCOUNTER — Telehealth: Payer: Self-pay | Admitting: Certified Nurse Midwife

## 2021-06-04 NOTE — Telephone Encounter (Signed)
Pt called stating Ann Deleon said she would need another order for labs since the genetic testing isn't showing anything. She said there is no charge to retest however we would need to place an order put in for labs. Pt is asking to do labs as soon as possible. Please return her call. ?

## 2021-06-05 ENCOUNTER — Telehealth: Payer: Self-pay | Admitting: Certified Nurse Midwife

## 2021-06-05 NOTE — Telephone Encounter (Signed)
Order sent, pt to verify.  ?

## 2021-06-05 NOTE — Telephone Encounter (Signed)
Req has been scanned to patients email for printing.  ?

## 2021-06-05 NOTE — Telephone Encounter (Signed)
Pt states Ann Deleon needs a order form to take in person to location [facility]) for Genetic testing. Is there anything she can print off from her MY CHART to provide on the day of her appointment? Please advise ?

## 2021-06-09 ENCOUNTER — Encounter: Payer: Self-pay | Admitting: Certified Nurse Midwife

## 2021-06-13 ENCOUNTER — Telehealth: Payer: Self-pay | Admitting: Certified Nurse Midwife

## 2021-06-13 NOTE — Telephone Encounter (Signed)
Order has been previously sent through [facility] portal, pt was contacted that order was placed.  ?

## 2021-06-13 NOTE — Telephone Encounter (Signed)
Pt would like to know if you were able to send the order to [facility] . She hasn't received the e-mail confirmation in regards to completing the registration. Please send a message to [facility] chart if unable to reach by phone. Pt is concerned she will reach the cut of window to have the test performed.  ?

## 2021-06-25 ENCOUNTER — Encounter: Payer: Self-pay | Admitting: Certified Nurse Midwife

## 2021-06-25 ENCOUNTER — Ambulatory Visit (INDEPENDENT_AMBULATORY_CARE_PROVIDER_SITE_OTHER): Payer: Managed Care, Other (non HMO) | Admitting: Certified Nurse Midwife

## 2021-06-25 ENCOUNTER — Ambulatory Visit (INDEPENDENT_AMBULATORY_CARE_PROVIDER_SITE_OTHER): Payer: Managed Care, Other (non HMO)

## 2021-06-25 VITALS — BP 136/77 | HR 88 | Wt 268.0 lb

## 2021-06-25 DIAGNOSIS — Z3402 Encounter for supervision of normal first pregnancy, second trimester: Secondary | ICD-10-CM

## 2021-06-25 DIAGNOSIS — Z3A16 16 weeks gestation of pregnancy: Secondary | ICD-10-CM

## 2021-06-25 DIAGNOSIS — Z3A20 20 weeks gestation of pregnancy: Secondary | ICD-10-CM

## 2021-06-25 LAB — POCT URINALYSIS DIPSTICK OB
Bilirubin, UA: NEGATIVE
Blood, UA: NEGATIVE
Glucose, UA: NEGATIVE
Ketones, UA: NEGATIVE
Leukocytes, UA: NEGATIVE
Nitrite, UA: NEGATIVE
POC,PROTEIN,UA: NEGATIVE
Spec Grav, UA: 1.015 (ref 1.010–1.025)
Urobilinogen, UA: 0.2 E.U./dL
pH, UA: 7 (ref 5.0–8.0)

## 2021-06-25 MED ORDER — ONDANSETRON 4 MG PO TBDP: 4.0000 mg | ORAL_TABLET | Freq: Three times a day (TID) | ORAL | 0 refills | Status: DC | PRN

## 2021-06-25 NOTE — Progress Notes (Signed)
Patient Name: ANNE BOLTZ ?DOB: [birth year] ?MRN: [id number] ?ULTRASOUND REPORT ? ?Location: [facility] ?Date of Service: 06/25/2021  ? ?Indications:Anatomy Ultrasound ?Findings:  ?Singleton intrauterine pregnancy is visualized with FHR at 152 BPM.  ? ?Biometrics give an (U/S) Gestational age of [redacted]w[redacted]d and an (U/S) EDD of 11/10/21; this correlates with the clinically established Estimated Date of Delivery: 11/10/21  ? ?Fetal presentation is Cephalic.  ?EFW: 359g / 13oz. ?Placenta: posterior. Grade: 0 ?AFI: subjectively normal. ? ?Anatomic survey is incomplete for Profile, nasal bone, LVOT, Ductal arch & Aortic arch and normal; Gender - female.   ? ?Ovaries are not visualized. ?Survey of the adnexa demonstrates no adnexal masses. ? ?There is no free peritoneal fluid in the cul de sac. ? ?Impression: ?1. [redacted]w[redacted]d Viable Singleton Intrauterine pregnancy by U/S. ?2. (U/S) EDD is consistent with Clinically established Estimated Date of Delivery: 11/10/21 . ?3. Incomplete Anatomy Scan ? ?Recommendations: ?1.Clinical correlation with the patient's History and Physical Exam. ?2. Patient will return in a week or several to complete anatomy survey. ? ?Sheralyn Boatman  [place] ? ?

## 2021-06-25 NOTE — Patient Instructions (Signed)
Round Ligament Pain  The round ligaments are a pair of cord-like tissues that help support the uterus. They can become a source of pain during pregnancy as the ligaments soften and stretch as the baby grows. The pain usually begins in the second trimester (13-28 weeks) of pregnancy, and should only last for a few seconds when it occurs. However, the pain can come and go until the baby is delivered. The pain does not cause harm to the baby. Round ligament pain is usually a short, sharp, and pinching pain, but it can also be a dull, lingering, and aching pain. The pain is felt in the lower side of the abdomen or in the groin. It usually starts deep in the groin and moves up to the outside of the hip area. The pain may happen when you: Suddenly change position, such as quickly going from a sitting to standing position. Do physical activity. Cough or sneeze. Follow these instructions at home: Managing pain  When the pain starts, relax. Then, try any of these methods to help with the pain: Sit down. Flex your knees up to your abdomen. Lie on your side with one pillow under your abdomen and another pillow between your legs. Sit in a warm bath for 15-20 minutes or until the pain goes away. General instructions Watch your condition for any changes. Move slowly when you sit down or stand up. Stop or reduce your physical activities if they cause pain. Avoid long walks if they cause pain. Take over-the-counter and prescription medicines only as told by your health care provider. Keep all follow-up visits. This is important. Contact a health care provider if: Your pain does not go away with treatment. You feel pain in your back that you did not have before. Your medicine is not helping. You have a fever or chills. You have nausea or vomiting. You have diarrhea. You have pain when you urinate. Get help right away if: You have pain that is a rhythmic, cramping pain similar to labor pains. Labor  pains are usually 2 minutes apart, last for about 1 minute, and involve a bearing down feeling or pressure in your pelvis. You have vaginal bleeding. These symptoms may represent a serious problem that is an emergency. Do not wait to see if the symptoms will go away. Get medical help right away. Call your local emergency services (911 in the [place] Do not drive yourself to the hospital. Summary Round ligament pain is felt in the lower abdomen or groin. This pain usually begins in the second trimester (13-28 weeks) and should only last for a few seconds when it occurs. You may notice the pain when you suddenly change position, when you cough or sneeze, or during physical activity. Relaxing, flexing your knees to your abdomen, lying on one side, or taking a warm bath may help to get rid of the pain. Contact your health care provider if the pain does not go away. This information is not intended to replace advice given to you by your health care provider. Make sure you discuss any questions you have with your health care provider. Document Revised: 04/17/2020 Document Reviewed: 04/17/2020 Elsevier Patient Education  2023 Elsevier Inc.  

## 2021-06-26 ENCOUNTER — Other Ambulatory Visit: Payer: Self-pay | Admitting: Certified Nurse Midwife

## 2021-06-27 MED ORDER — ONDANSETRON 4 MG PO TBDP: 4.0000 mg | ORAL_TABLET | Freq: Three times a day (TID) | ORAL | 0 refills | Status: DC | PRN

## 2021-07-03 ENCOUNTER — Encounter: Payer: Self-pay | Admitting: Certified Nurse Midwife

## 2021-07-03 NOTE — Telephone Encounter (Signed)
Spoke with patient in regards to pregnancy medicaid stated she would call and see if she qualify's.Patient will keep appointment  until she verify's  coverage with medicaid office.Pt verbalized and confirmed understanding.

## 2021-07-23 ENCOUNTER — Ambulatory Visit (INDEPENDENT_AMBULATORY_CARE_PROVIDER_SITE_OTHER): Payer: Managed Care, Other (non HMO) | Admitting: Obstetrics

## 2021-07-23 ENCOUNTER — Ambulatory Visit (INDEPENDENT_AMBULATORY_CARE_PROVIDER_SITE_OTHER): Payer: Medicaid Other

## 2021-07-23 VITALS — BP 120/80 | HR 108 | Wt 265.0 lb

## 2021-07-23 DIAGNOSIS — Z3A24 24 weeks gestation of pregnancy: Secondary | ICD-10-CM | POA: Diagnosis not present

## 2021-07-23 DIAGNOSIS — Z362 Encounter for other antenatal screening follow-up: Secondary | ICD-10-CM

## 2021-07-23 DIAGNOSIS — Z3402 Encounter for supervision of normal first pregnancy, second trimester: Secondary | ICD-10-CM

## 2021-07-23 LAB — POCT URINALYSIS DIPSTICK OB
Bilirubin, UA: NEGATIVE
Glucose, UA: NEGATIVE
Ketones, UA: NEGATIVE
Nitrite, UA: NEGATIVE
Spec Grav, UA: 1.015 (ref 1.010–1.025)
Urobilinogen, UA: 0.2 E.U./dL
pH, UA: 5 (ref 5.0–8.0)

## 2021-07-23 NOTE — Progress Notes (Signed)
ROB-left side pelvic pain at times for the last month

## 2021-07-23 NOTE — Progress Notes (Signed)
Ann Deleon at [redacted]w[redacted]d. Good fetal movement. Having some round ligament, back, and pelvic pain. Encouraged chiropractor, heating pad, exercises, stretches. Denies ctx, LOF, vaginal bleeding. Had urinary frequency and dysuria yesterday which has since resolved. UC sent. Ann Deleon states that she and her husband both had "holes in their heart" as babies that resolved spontaneously. She states that she had an episode as a baby or toddler where she required resuscitation. She is unsure of the exact diagnosis but will try to find out more information. We will discuss appropriate f/u at her next visit (fetal echo vs pediatrician f/u). Discussed 1-hour glucose, CBC, RPR at next visit. RTC in 4 weeks.  Ann Deleon Spanish, CNM

## 2021-07-25 LAB — CULTURE, OB URINE

## 2021-07-25 LAB — URINE CULTURE, OB REFLEX

## 2021-08-08 ENCOUNTER — Other Ambulatory Visit: Payer: Self-pay | Admitting: Obstetrics

## 2021-08-08 ENCOUNTER — Telehealth: Payer: Self-pay | Admitting: Obstetrics

## 2021-08-08 DIAGNOSIS — M549 Dorsalgia, unspecified: Secondary | ICD-10-CM

## 2021-08-10 ENCOUNTER — Encounter: Payer: Self-pay | Admitting: Certified Nurse Midwife

## 2021-08-27 ENCOUNTER — Other Ambulatory Visit: Payer: Managed Care, Other (non HMO)

## 2021-08-27 ENCOUNTER — Ambulatory Visit (INDEPENDENT_AMBULATORY_CARE_PROVIDER_SITE_OTHER): Payer: Medicaid Other | Admitting: Certified Nurse Midwife

## 2021-08-27 VITALS — BP 131/83 | HR 102 | Wt 264.0 lb

## 2021-08-27 DIAGNOSIS — Z3403 Encounter for supervision of normal first pregnancy, third trimester: Secondary | ICD-10-CM | POA: Insufficient documentation

## 2021-08-27 DIAGNOSIS — Z3A29 29 weeks gestation of pregnancy: Secondary | ICD-10-CM

## 2021-08-27 DIAGNOSIS — Z23 Encounter for immunization: Secondary | ICD-10-CM | POA: Diagnosis not present

## 2021-08-27 DIAGNOSIS — Z3A28 28 weeks gestation of pregnancy: Secondary | ICD-10-CM

## 2021-08-27 LAB — POCT URINALYSIS DIPSTICK OB
Bilirubin, UA: NEGATIVE
Glucose, UA: NEGATIVE
Nitrite, UA: NEGATIVE
POC,PROTEIN,UA: NEGATIVE
Spec Grav, UA: 1.015 (ref 1.010–1.025)
Urobilinogen, UA: 0.2 E.U./dL
pH, UA: 6.5 (ref 5.0–8.0)

## 2021-08-27 MED ORDER — TETANUS-DIPHTH-ACELL PERTUSSIS 5-2.5-18.5 LF-MCG/0.5 IM SUSY
0.5000 mL | PREFILLED_SYRINGE | Freq: Once | INTRAMUSCULAR | Status: AC
  Administered 2021-08-27: 0.5 mL via INTRAMUSCULAR

## 2021-08-27 NOTE — Patient Instructions (Signed)
Oral Glucose Tolerance Test During Pregnancy Why am I having this test? The oral glucose tolerance test (OGTT) is done to check how your body processes blood sugar (glucose). This is one of several tests used to diagnose diabetes that develops during pregnancy (gestational diabetes mellitus). Gestational diabetes is a short-term form of diabetes that some women develop while they are pregnant. It usually occurs during the second trimester of pregnancy and goes away after delivery. Testing, or screening, for gestational diabetes usually occurs at weeks 24-28 of pregnancy. You may have the OGTT test after having a 1-hour glucose screening test if the results from that test indicate that you may have gestational diabetes. This test may also be needed if: You have a history of gestational diabetes. There is a history of giving birth to very large babies or of losing pregnancies (having stillbirths). You have signs and symptoms of diabetes, such as: Changes in your eyesight. Tingling or numbness in your hands or feet. Changes in hunger, thirst, and urination, and these are not explained by your pregnancy. What is being tested? This test measures the amount of glucose in your blood at different times during a period of 3 hours. This shows how well your body can process glucose. What kind of sample is taken?  Blood samples are required for this test. They are usually collected by inserting a needle into a blood vessel. How do I prepare for this test? For 3 days before your test, eat normally. Have plenty of carbohydrate-rich foods. Follow instructions from your health care provider about: Eating or drinking restrictions on the day of the test. You may be asked not to eat or drink anything other than water (to fast) starting 8-10 hours before the test. Changing or stopping your regular medicines. Some medicines may interfere with this test. Tell a health care provider about: All medicines you are  taking, including vitamins, herbs, eye drops, creams, and over-the-counter medicines. Any blood disorders you have. Any surgeries you have had. Any medical conditions you have. What happens during the test? First, your blood glucose will be measured. This is referred to as your fasting blood glucose because you fasted before the test. Then, you will drink a glucose solution that contains a certain amount of glucose. Your blood glucose will be measured again 1, 2, and 3 hours after you drink the solution. This test takes about 3 hours to complete. You will need to stay at the testing location during this time. During the testing period: Do not eat or drink anything other than the glucose solution. Do not exercise. Do not use any products that contain nicotine or tobacco, such as cigarettes, e-cigarettes, and chewing tobacco. These can affect your test results. If you need help quitting, ask your health care provider. The testing procedure may vary among health care providers and hospitals. How are the results reported? Your results will be reported as milligrams of glucose per deciliter of blood (mg/dL) or millimoles per liter (mmol/L). There is more than one source for screening and diagnosis reference values used to diagnose gestational diabetes. Your health care provider will compare your results to normal values that were established after testing a large group of people (reference values). Reference values may vary among labs and hospitals. For this test (Carpenter-Coustan), reference values are: Fasting: 95 mg/dL (5.3 mmol/L). 1 hour: 180 mg/dL (10.0 mmol/L). 2 hour: 155 mg/dL (8.6 mmol/L). 3 hour: 140 mg/dL (7.8 mmol/L). What do the results mean? Results below the reference values are   considered normal. If two or more of your blood glucose levels are at or above the reference values, you may be diagnosed with gestational diabetes. If only one level is high, your health care provider may  suggest repeat testing or other tests to confirm a diagnosis. Talk with your health care provider about what your results mean. Questions to ask your health care provider Ask your health care provider, or the department that is doing the test: When will my results be ready? How will I get my results? What are my treatment options? What other tests do I need? What are my next steps? Summary The oral glucose tolerance test (OGTT) is one of several tests used to diagnose diabetes that develops during pregnancy (gestational diabetes mellitus). Gestational diabetes is a short-term form of diabetes that some women develop while they are pregnant. You may have the OGTT test after having a 1-hour glucose screening test if the results from that test show that you may have gestational diabetes. You may also have this test if you have any symptoms or risk factors for this type of diabetes. Talk with your health care provider about what your results mean. This information is not intended to replace advice given to you by your health care provider. Make sure you discuss any questions you have with your health care provider. Document Revised: 07/13/2019 Document Reviewed: 07/13/2019 Elsevier Patient Education  2023 Elsevier Inc.  

## 2021-08-27 NOTE — Progress Notes (Signed)
ROB doing well. Feels good movement. 28 wk labs today: Glucose screen/RPR/CBC. Tdap, Blood transfusion consent completed, all questions answered. Ready set baby reviewed, see check list for topics covered. Sample birth plan given, will follow up in upcoming visits. Discussed birth control after delivery, information pamphlet given.   Follow up 3 wk with Missy for ROB or sooner if needed.    Doreene Burke, CNM

## 2021-08-28 ENCOUNTER — Other Ambulatory Visit: Payer: Self-pay | Admitting: Certified Nurse Midwife

## 2021-08-28 ENCOUNTER — Encounter: Payer: Self-pay | Admitting: Certified Nurse Midwife

## 2021-08-28 DIAGNOSIS — O99810 Abnormal glucose complicating pregnancy: Secondary | ICD-10-CM

## 2021-08-28 LAB — CBC
Hematocrit: 34.7 % (ref 34.0–46.6)
Hemoglobin: 11.4 g/dL (ref 11.1–15.9)
MCH: 28.9 pg (ref 26.6–33.0)
MCHC: 32.9 g/dL (ref 31.5–35.7)
MCV: 88 fL (ref 79–97)
Platelets: 259 10*3/uL (ref 150–450)
RBC: 3.94 x10E6/uL (ref 3.77–5.28)
RDW: 11.9 % (ref 11.7–15.4)
WBC: 10.2 10*3/uL (ref 3.4–10.8)

## 2021-08-28 LAB — GLUCOSE, 1 HOUR GESTATIONAL: Gestational Diabetes Screen: 147 mg/dL — ABNORMAL HIGH (ref 70–139)

## 2021-08-28 LAB — RPR: RPR Ser Ql: NONREACTIVE

## 2021-08-29 LAB — URINE CULTURE

## 2021-09-01 ENCOUNTER — Encounter: Payer: 59 | Admitting: Certified Nurse Midwife

## 2021-09-03 ENCOUNTER — Other Ambulatory Visit: Payer: Medicaid Other

## 2021-09-03 DIAGNOSIS — O99810 Abnormal glucose complicating pregnancy: Secondary | ICD-10-CM

## 2021-09-04 LAB — GESTATIONAL GLUCOSE TOLERANCE
Glucose, Fasting: 84 mg/dL (ref 70–94)
Glucose, GTT - 1 Hour: 153 mg/dL (ref 70–179)
Glucose, GTT - 2 Hour: 115 mg/dL (ref 70–154)
Glucose, GTT - 3 Hour: 52 mg/dL — ABNORMAL LOW (ref 70–139)

## 2021-09-08 ENCOUNTER — Encounter: Payer: Self-pay | Admitting: Certified Nurse Midwife

## 2021-09-15 ENCOUNTER — Encounter: Payer: Self-pay | Admitting: Obstetrics

## 2021-09-15 ENCOUNTER — Ambulatory Visit (INDEPENDENT_AMBULATORY_CARE_PROVIDER_SITE_OTHER): Payer: Medicaid Other | Admitting: Obstetrics

## 2021-09-15 VITALS — BP 138/89 | HR 120 | Wt 263.5 lb

## 2021-09-15 DIAGNOSIS — Z3A32 32 weeks gestation of pregnancy: Secondary | ICD-10-CM

## 2021-09-15 LAB — POCT URINALYSIS DIPSTICK OB
Bilirubin, UA: NEGATIVE
Glucose, UA: NEGATIVE
Ketones, UA: NEGATIVE
Leukocytes, UA: NEGATIVE
Nitrite, UA: NEGATIVE
POC,PROTEIN,UA: NEGATIVE
Spec Grav, UA: 1.02 (ref 1.010–1.025)
Urobilinogen, UA: 0.2 E.U./dL
pH, UA: 6.5 (ref 5.0–8.0)

## 2021-09-15 NOTE — Progress Notes (Signed)
ROB at [redacted]w[redacted]d. Active baby. Ann Deleon is having a few cramps but no ctx, LOF, or vaginal bleeding. Discussion about birth plan, comfort measures in labor, hospital routines, timing of birth, contractions, PTL today. She is a little concerned about anxiety during labor. She would like to avoid an epidural. Encouraged CBE. RTC in 2 weeks.  Guadlupe Spanish, CNM

## 2021-10-01 ENCOUNTER — Encounter: Payer: Self-pay | Admitting: Certified Nurse Midwife

## 2021-10-01 ENCOUNTER — Ambulatory Visit (INDEPENDENT_AMBULATORY_CARE_PROVIDER_SITE_OTHER): Payer: Medicaid Other | Admitting: Certified Nurse Midwife

## 2021-10-01 VITALS — BP 132/84 | HR 109 | Wt 265.2 lb

## 2021-10-01 DIAGNOSIS — Z3A34 34 weeks gestation of pregnancy: Secondary | ICD-10-CM

## 2021-10-01 DIAGNOSIS — O99213 Obesity complicating pregnancy, third trimester: Secondary | ICD-10-CM

## 2021-10-01 MED ORDER — SERTRALINE HCL 50 MG PO TABS: 50.0000 mg | ORAL_TABLET | Freq: Every day | ORAL | 3 refills | Status: DC

## 2021-10-01 NOTE — Addendum Note (Signed)
Addended by: Mechele Claude on: 10/01/2021 03:31 PM   Modules accepted: Orders

## 2021-10-01 NOTE — Patient Instructions (Signed)

## 2021-10-01 NOTE — Progress Notes (Addendum)
Body mass index is 40.32 kg/m. ROB doing well, pt is very anxious asking about delivery . She is tearful , state she is worried that her anxiety will affect how she copes in labor. She is requesting medication . Discussed benefits and risks of use. Mother to baby fact sheet given. Orders placed. Due to elevated BMI discussed u/s for growth/afi next appointment. And start of NSTs. Discussed induction in the 39ths week. She verbalizes and agrees to plan. She is requesting to schedule induction now, called L&D and was not able to do it yet due to it being to far out.  Follow up 2 wk .   Doreene Burke, CNM

## 2021-10-22 ENCOUNTER — Other Ambulatory Visit: Payer: Self-pay | Admitting: Certified Nurse Midwife

## 2021-10-22 ENCOUNTER — Other Ambulatory Visit: Payer: Medicaid Other

## 2021-10-22 ENCOUNTER — Ambulatory Visit (INDEPENDENT_AMBULATORY_CARE_PROVIDER_SITE_OTHER): Payer: Medicaid Other | Admitting: Obstetrics

## 2021-10-22 ENCOUNTER — Encounter: Payer: Self-pay | Admitting: Obstetrics

## 2021-10-22 ENCOUNTER — Observation Stay
Admission: EM | Admit: 2021-10-22 | Discharge: 2021-10-23 | Disposition: A | Discharge status: Home / Self Care | Payer: Medicaid Other | Attending: Obstetrics | Admitting: Obstetrics

## 2021-10-22 ENCOUNTER — Other Ambulatory Visit: Payer: Self-pay

## 2021-10-22 ENCOUNTER — Encounter: Payer: Self-pay | Admitting: Obstetrics and Gynecology

## 2021-10-22 ENCOUNTER — Other Ambulatory Visit: Payer: Self-pay | Admitting: Obstetrics

## 2021-10-22 ENCOUNTER — Ambulatory Visit (INDEPENDENT_AMBULATORY_CARE_PROVIDER_SITE_OTHER): Payer: Medicaid Other

## 2021-10-22 VITALS — BP 151/92 | HR 128 | Wt 260.8 lb

## 2021-10-22 DIAGNOSIS — O26893 Other specified pregnancy related conditions, third trimester: Secondary | ICD-10-CM | POA: Insufficient documentation

## 2021-10-22 DIAGNOSIS — Z79899 Other long term (current) drug therapy: Secondary | ICD-10-CM | POA: Insufficient documentation

## 2021-10-22 DIAGNOSIS — Z3A37 37 weeks gestation of pregnancy: Secondary | ICD-10-CM | POA: Insufficient documentation

## 2021-10-22 DIAGNOSIS — O163 Unspecified maternal hypertension, third trimester: Secondary | ICD-10-CM

## 2021-10-22 DIAGNOSIS — Z7982 Long term (current) use of aspirin: Secondary | ICD-10-CM | POA: Insufficient documentation

## 2021-10-22 DIAGNOSIS — O99513 Diseases of the respiratory system complicating pregnancy, third trimester: Secondary | ICD-10-CM | POA: Insufficient documentation

## 2021-10-22 DIAGNOSIS — O99213 Obesity complicating pregnancy, third trimester: Secondary | ICD-10-CM

## 2021-10-22 DIAGNOSIS — R1010 Upper abdominal pain, unspecified: Secondary | ICD-10-CM | POA: Insufficient documentation

## 2021-10-22 DIAGNOSIS — O219 Vomiting of pregnancy, unspecified: Secondary | ICD-10-CM | POA: Diagnosis present

## 2021-10-22 DIAGNOSIS — O212 Late vomiting of pregnancy: Principal | ICD-10-CM | POA: Insufficient documentation

## 2021-10-22 DIAGNOSIS — E669 Obesity, unspecified: Secondary | ICD-10-CM

## 2021-10-22 DIAGNOSIS — Z3A34 34 weeks gestation of pregnancy: Secondary | ICD-10-CM

## 2021-10-22 DIAGNOSIS — Z3A35 35 weeks gestation of pregnancy: Secondary | ICD-10-CM | POA: Diagnosis not present

## 2021-10-22 DIAGNOSIS — J45909 Unspecified asthma, uncomplicated: Secondary | ICD-10-CM | POA: Insufficient documentation

## 2021-10-22 DIAGNOSIS — O321XX0 Maternal care for breech presentation, not applicable or unspecified: Secondary | ICD-10-CM | POA: Insufficient documentation

## 2021-10-22 LAB — POCT URINALYSIS DIPSTICK OB
Bilirubin, UA: NEGATIVE
Glucose, UA: NEGATIVE
Ketones, UA: NEGATIVE
Leukocytes, UA: NEGATIVE
Nitrite, UA: NEGATIVE
POC,PROTEIN,UA: NEGATIVE
Spec Grav, UA: 1.02 (ref 1.010–1.025)
Urobilinogen, UA: 0.2 E.U./dL
pH, UA: 7 (ref 5.0–8.0)

## 2021-10-22 MED ORDER — HYDROXYZINE HCL 25 MG PO TABS: 25.0000 mg | ORAL_TABLET | Freq: Three times a day (TID) | ORAL | 1 refills | Status: DC | PRN

## 2021-10-22 NOTE — OB Triage Note (Addendum)
Patient came in [redacted]w[redacted]d c/o back and abdominal pain. Denies contractions. Good fetal movement. Patient [facility] today was told baby is breech presentation. Blood pressures run 130's/80's. Pain 6/10.

## 2021-10-22 NOTE — Progress Notes (Signed)
ROB at [redacted]w[redacted]d. Active baby. Reactive NST this morning (see note). Ann Deleon reports nausea and vomiting today. Her BP was elevated at home and she had a mild headache that resolved with Tylenol. She does report RUQ pain that has been ongoing but worsened with vomiting. Her initial BP in the office 151/92. Repeat at the end of the visit was 134/88. Growth [place] today shows EFW in the 14th percentile with [facility] in the 9th percentile. Cord dopplers are normal. Baby is in breech position. Reviewed findings with Dr. Valentino Saxon and discussed plan of care with Ann Deleon and her partner. Ann Deleon is not interested in attempted ECV and would like a scheduled cesarean. Will plan for around 39 weeks if BP and labs are stable.  Reviewed preeclampsia danger signs and when to go to the hospital. Ann Deleon is having significant anxiety. She tried Zoloft for 2 days but did not like the side effects. She would like to try PRN hydroxyzine. Rx sent to pharmacy. Preeclampsia labs today. Return for BP check on Friday. ROB/cesarean consult in 1 week with MD.  Guadlupe Spanish, CNM

## 2021-10-23 DIAGNOSIS — O219 Vomiting of pregnancy, unspecified: Secondary | ICD-10-CM

## 2021-10-23 DIAGNOSIS — O99513 Diseases of the respiratory system complicating pregnancy, third trimester: Secondary | ICD-10-CM | POA: Diagnosis not present

## 2021-10-23 DIAGNOSIS — Z79899 Other long term (current) drug therapy: Secondary | ICD-10-CM | POA: Diagnosis not present

## 2021-10-23 DIAGNOSIS — O212 Late vomiting of pregnancy: Secondary | ICD-10-CM | POA: Diagnosis not present

## 2021-10-23 DIAGNOSIS — Z3A37 37 weeks gestation of pregnancy: Secondary | ICD-10-CM

## 2021-10-23 DIAGNOSIS — R1010 Upper abdominal pain, unspecified: Secondary | ICD-10-CM | POA: Diagnosis not present

## 2021-10-23 DIAGNOSIS — O321XX0 Maternal care for breech presentation, not applicable or unspecified: Secondary | ICD-10-CM | POA: Diagnosis not present

## 2021-10-23 DIAGNOSIS — Z7982 Long term (current) use of aspirin: Secondary | ICD-10-CM | POA: Diagnosis not present

## 2021-10-23 DIAGNOSIS — J45909 Unspecified asthma, uncomplicated: Secondary | ICD-10-CM | POA: Diagnosis not present

## 2021-10-23 DIAGNOSIS — O26893 Other specified pregnancy related conditions, third trimester: Secondary | ICD-10-CM | POA: Diagnosis not present

## 2021-10-23 LAB — URINALYSIS, ROUTINE W REFLEX MICROSCOPIC
Bilirubin Urine: NEGATIVE
Glucose, UA: NEGATIVE mg/dL
Ketones, ur: 80 mg/dL — AB
Nitrite: NEGATIVE
Protein, ur: 30 mg/dL — AB
Specific Gravity, Urine: 1.026 (ref 1.005–1.030)
pH: 6 (ref 5.0–8.0)

## 2021-10-23 MED ORDER — ONDANSETRON 4 MG PO TBDP
ORAL_TABLET | ORAL | Status: AC
  Administered 2021-10-23: 8 mg via ORAL
  Filled 2021-10-23: qty 2

## 2021-10-23 MED ORDER — HYDROXYZINE HCL 25 MG PO TABS
50.0000 mg | ORAL_TABLET | Freq: Four times a day (QID) | ORAL | Status: DC | PRN
  Administered 2021-10-23: 50 mg via ORAL
  Filled 2021-10-23: qty 2

## 2021-10-23 MED ORDER — ONDANSETRON 4 MG PO TBDP
8.0000 mg | ORAL_TABLET | Freq: Once | ORAL | Status: DC
Start: 2021-10-23 — End: 2021-10-23

## 2021-10-23 MED ORDER — ONDANSETRON 4 MG PO TBDP: 8.0000 mg | ORAL_TABLET | Freq: Once | ORAL | Status: AC

## 2021-10-23 NOTE — Final Progress Note (Addendum)
Final Progress Note  Patient ID: BARBY COLVARD MRN: [id number] DOB/AGE: 1996/01/16 26 y.o.  Admit date: 10/22/2021 Admitting provider: Mirna Mires, CNM Discharge date: 10/23/2021   Admission Diagnoses: nausea and vomiting in pregnancy  Discharge Diagnoses:  Principal Problem:   Nausea and vomiting in pregnancy  Category 1 FHTs Breech presentation  History of Present Illness: The patient is a 26 y.o. female G1P0000 at [redacted]w[redacted]d who presents for complaint of upper abdominal pain today. She was seen at the [facility] office for an OB visit and the provider discovered her baby is breech. At that time, she complained of some epigastric pain, and had one elevated blood pressure. PIH labs are pending. The patient denies any headache,  or edema. She shares that she feels anxious about finding out that her baby s breech a, and initially  thought she would have a primary Cesarean section. She is still processing this news, which has added to her anxiety. She has been given Zoloft (quit after two days) and Atarax to address her worrying. She has eaten little today- had some liquids. She has thrown up numerous times today.  Past Medical History:  Diagnosis Date   Anxiety    Asthma    GERD (gastroesophageal reflux disease)    Sexually active at young age     Past Surgical History:  Procedure Laterality Date   MANDIBLE SURGERY      No current facility-administered medications on file prior to encounter.   Current Outpatient Medications on File Prior to Encounter  Medication Sig Dispense Refill   albuterol (PROVENTIL HFA;VENTOLIN HFA) 108 (90 BASE) MCG/ACT inhaler Inhale 2 puffs into the lungs 4 (four) times daily as needed. (Patient not taking: Reported on 08/27/2021)     albuterol (PROVENTIL) (2.5 MG/3ML) 0.083% nebulizer solution Take 3 mLs (2.5 mg total) by nebulization every 6 (six) hours as needed for wheezing or shortness of breath. 150 mL 2   aspirin EC 81 MG tablet Take 1 tablet (81 mg  total) by mouth daily. Swallow whole.Start at [redacted] weeks pregnant 30 tablet 11   hydrOXYzine (ATARAX) 25 MG tablet Take 1 tablet (25 mg total) by mouth 3 (three) times daily as needed. 30 tablet 1   ondansetron (ZOFRAN-ODT) 4 MG disintegrating tablet Take 1 tablet (4 mg total) by mouth every 8 (eight) hours as needed for nausea or vomiting. 20 tablet 0   Prenatal Vit-Fe Fumarate-FA (PRENATAL PO) Take by mouth.     sertraline (ZOLOFT) 50 MG tablet Take 1 tablet (50 mg total) by mouth daily. 30 tablet 3   Spacer/Aero-Holding Chambers (AEROCHAMBER PLUS WITH MASK) inhaler Use as instructed 1 each 1    No Known Allergies  Social History   Socioeconomic History   Marital status: Married    Spouse name: Not on file   Number of children: Not on file   Years of education: Not on file   Highest education level: Not on file  Occupational History   Not on file  Tobacco Use   Smoking status: Never   Smokeless tobacco: Never  Vaping Use   Vaping Use: Never used  Substance and Sexual Activity   Alcohol use: Not Currently   Drug use: No   Sexual activity: Yes    Birth control/protection: Condom  Other Topics Concern   Not on file  Social History Narrative   Not on file   Social Determinants of Health   Financial Resource Strain: Not on file  Food Insecurity: Not on file  Transportation Needs: Not on file  Physical Activity: Not on file  Stress: Not on file  Social Connections: Not on file  Intimate Partner Violence: Not on file    Family History  Problem Relation Age of Onset   Hypertension Mother    Depression Mother    Anxiety disorder Mother    Ovarian cysts Mother    Fibroids Mother    Hypertension Father    Diabetes Father    Nephrolithiasis Father    Depression Father    Breast cancer Neg Hx    Ovarian cancer Neg Hx    Colon cancer Neg Hx      Review of Systems  Constitutional: Negative.   HENT: Negative.    Eyes: Negative.   Respiratory: Negative.     Cardiovascular: Negative.   Gastrointestinal:  Positive for abdominal pain, heartburn, nausea and vomiting.       Upper abdominal pain. Has vomited 5 times today.   Genitourinary: Negative.   Musculoskeletal: Negative.   Skin: Negative.   Neurological: Negative.   Endo/Heme/Allergies: Negative.   Psychiatric/Behavioral:  The patient is nervous/anxious.        Admits to some anxiety.     Physical Exam: BP 123/77 (BP Location: Right Arm)   Pulse 98   Temp 98.9 F (37.2 C) (Oral)   Resp 18   Ht 5\' 8"  (1.727 m)   Wt 117.9 kg   LMP 02/03/2021 (Exact Date)   SpO2 99%   BMI 39.53 kg/m   Physical Exam Constitutional:      Appearance: Normal appearance. She is obese.  Cardiovascular:     Rate and Rhythm: Normal rate and regular rhythm.     Pulses: Normal pulses.     Heart sounds: Normal heart sounds.  Pulmonary:     Effort: Pulmonary effort is normal.     Breath sounds: Normal breath sounds.  Abdominal:     Palpations: Abdomen is soft.     Tenderness: There is no abdominal tenderness.     Comments: Tender in the upper abdominal area. No contractions palpated.  + bowel sounds  Musculoskeletal:        General: Normal range of motion.  Neurological:     General: No focal deficit present.     Mental Status: She is oriented to person, place, and time.  Skin:    General: Skin is warm and dry.  Psychiatric:        Mood and Affect: Mood normal.        Behavior: Behavior normal.        Thought Content: Thought content normal.     Consults: None  Significant Findings/ Diagnostic Studies: labs:  Results for orders placed or performed during the hospital encounter of 10/22/21 (from the past 24 hour(s))  Urinalysis, Routine w reflex microscopic Urine, Clean Catch     Status: Abnormal   Collection Time: 10/23/21  1:05 AM  Result Value Ref Range   Color, Urine YELLOW (A) YELLOW   APPearance CLOUDY (A) CLEAR   Specific Gravity, Urine 1.026 1.005 - 1.030   pH 6.0 5.0 - 8.0    Glucose, UA NEGATIVE NEGATIVE mg/dL   Hgb urine dipstick MODERATE (A) NEGATIVE   Bilirubin Urine NEGATIVE NEGATIVE   Ketones, ur 80 (A) NEGATIVE mg/dL   Protein, ur 30 (A) NEGATIVE mg/dL   Nitrite NEGATIVE NEGATIVE   Leukocytes,Ua MODERATE (A) NEGATIVE   RBC / HPF 21-50 0 - 5 RBC/hpf   WBC, UA 0-5 0 -  5 WBC/hpf   Bacteria, UA RARE (A) NONE SEEN   Squamous Epithelial / LPF 11-20 0 - 5   Mucus PRESENT    Hyaline Casts, UA PRESENT    Ca Oxalate Crys, UA PRESENT      Procedures: EFM NST Baseline FHR: 150 beats/min Variability: moderate Accelerations: present Decelerations: present-one isolated variable noted. Tocometry: no contractions other than a few BH contractions.  Interpretation:  INDICATIONS: patient reassurance and rule out uterine contractions RESULTS:  A NST procedure was performed with FHR monitoring and a normal baseline established, appropriate time of 20-40 minutes of evaluation, and accels >2 seen w 15x15 characteristics.  Results show a REACTIVE NST.    Hospital Course: The patient was admitted to Labor and Delivery Triage for observation. A UA was sent to the lab. Her NST was reactive and reassuring. She was evaluated, and provided Zofran ODT as well as Ataraxfor her nausea and for some anxiety.The breech presentation was confirmed with a bedside ultrasound. The option of external version prior to scheduling a primary Cesarean was discussed with her. Information on breech til exercises was also provided . After several hours, she felt much better, and her pain had subsided and after she had hydrated with [place] ice and juice. She was then discharged home with plans to f/u tomorrow with [facility].  Discharge Condition: good  Disposition: Discharge disposition: 01-Home or Self Care       Diet: Clear liquid diet, advance as tolerated  Discharge Activity: Activity as tolerated  Discharge Instructions     Fetal Kick Count:  Lie on our left side for one hour  after a meal, and count the number of times your baby kicks.  If it is less than 5 times, get up, move around and drink some juice.  Repeat the test 30 minutes later.  If it is still less than 5 kicks in an hour, notify your doctor.   Complete by: As directed    LABOR:  When conractions begin, you should start to time them from the beginning of one contraction to the beginning  of the next.  When contractions are 5 - 10 minutes apart or less and have been regular for at least an hour, you should call your health care provider.   Complete by: As directed    Notify physician for bleeding from the vagina   Complete by: As directed    Notify physician for blurring of vision or spots before the eyes   Complete by: As directed    Notify physician for chills or fever   Complete by: As directed    Notify physician for fainting spells, "black outs" or loss of consciousness   Complete by: As directed    Notify physician for increase in vaginal discharge   Complete by: As directed    Notify physician for leaking of fluid   Complete by: As directed    Notify physician for pain or burning when urinating   Complete by: As directed    Notify physician for pelvic pressure (sudden increase)   Complete by: As directed    Notify physician for severe or continued nausea or vomiting   Complete by: As directed    Notify physician for sudden gushing of fluid from the vagina (with or without continued leaking)   Complete by: As directed    Notify physician for sudden, constant, or occasional abdominal pain   Complete by: As directed    Notify physician if baby moving less than  usual   Complete by: As directed       Allergies as of 10/23/2021   No Known Allergies      Medication List     TAKE these medications    aerochamber plus with mask inhaler Use as instructed   albuterol 108 (90 Base) MCG/ACT inhaler Commonly known as: VENTOLIN HFA Inhale 2 puffs into the lungs 4 (four) times daily as  needed.   albuterol (2.5 MG/3ML) 0.083% nebulizer solution Commonly known as: PROVENTIL Take 3 mLs (2.5 mg total) by nebulization every 6 (six) hours as needed for wheezing or shortness of breath.   aspirin EC 81 MG tablet Take 1 tablet (81 mg total) by mouth daily. Swallow whole.Start at [redacted] weeks pregnant   hydrOXYzine 25 MG tablet Commonly known as: ATARAX Take 1 tablet (25 mg total) by mouth 3 (three) times daily as needed.   ondansetron 4 MG disintegrating tablet Commonly known as: ZOFRAN-ODT Take 1 tablet (4 mg total) by mouth every 8 (eight) hours as needed for nausea or vomiting.   PRENATAL PO Take by mouth.   sertraline 50 MG tablet Commonly known as: Zoloft Take 1 tablet (50 mg total) by mouth daily.         Total time spent taking care of this patient: 45 minutes providing direct patient care, reviewing records,  and discussing options for breech presentation   Signed: Mirna Mires, CNM  10/23/2021, 1:55 AM

## 2021-10-23 NOTE — Discharge Summary (Signed)
Please see Final Progress Note.  Mirna Mires, CNM  10/23/2021 1:22 AM

## 2021-10-24 ENCOUNTER — Ambulatory Visit (INDEPENDENT_AMBULATORY_CARE_PROVIDER_SITE_OTHER): Payer: Medicaid Other | Admitting: Obstetrics

## 2021-10-24 VITALS — BP 128/84 | Ht 68.0 in | Wt 260.0 lb

## 2021-10-24 DIAGNOSIS — Z0130 Encounter for examination of blood pressure without abnormal findings: Secondary | ICD-10-CM

## 2021-10-24 LAB — CBC
Hematocrit: 35.4 % (ref 34.0–46.6)
Hemoglobin: 11.6 g/dL (ref 11.1–15.9)
MCH: 26.4 pg — ABNORMAL LOW (ref 26.6–33.0)
MCHC: 32.8 g/dL (ref 31.5–35.7)
MCV: 81 fL (ref 79–97)
Platelets: 315 10*3/uL (ref 150–450)
RBC: 4.39 x10E6/uL (ref 3.77–5.28)
RDW: 12.6 % (ref 11.7–15.4)
WBC: 11.7 10*3/uL — ABNORMAL HIGH (ref 3.4–10.8)

## 2021-10-24 LAB — COMPREHENSIVE METABOLIC PANEL
ALT: 18 IU/L (ref 0–32)
AST: 16 IU/L (ref 0–40)
Albumin/Globulin Ratio: 1.3 (ref 1.2–2.2)
Albumin: 4 g/dL (ref 4.0–5.0)
Alkaline Phosphatase: 232 IU/L — ABNORMAL HIGH (ref 44–121)
BUN/Creatinine Ratio: 11 (ref 9–23)
BUN: 7 mg/dL (ref 6–20)
Bilirubin Total: 0.3 mg/dL (ref 0.0–1.2)
CO2: 17 mmol/L — ABNORMAL LOW (ref 20–29)
Calcium: 9.4 mg/dL (ref 8.7–10.2)
Chloride: 103 mmol/L (ref 96–106)
Creatinine, Ser: 0.66 mg/dL (ref 0.57–1.00)
Globulin, Total: 3 g/dL (ref 1.5–4.5)
Glucose: 75 mg/dL (ref 70–99)
Potassium: 4.2 mmol/L (ref 3.5–5.2)
Sodium: 139 mmol/L (ref 134–144)
Total Protein: 7 g/dL (ref 6.0–8.5)
eGFR: 126 mL/min/{1.73_m2} (ref >59)

## 2021-10-24 LAB — PROTEIN / CREATININE RATIO, URINE
Creatinine, Urine: 332.5 mg/dL
Protein, Ur: 49.1 mg/dL
Protein/Creat Ratio: 148 mg/g creat (ref 0–200)

## 2021-10-24 NOTE — Progress Notes (Signed)
Patient is here for blood pressure check reports no symptoms or concerns. Let provider on file know was ok to leave.

## 2021-10-27 ENCOUNTER — Encounter: Payer: Self-pay | Admitting: Obstetrics

## 2021-10-28 ENCOUNTER — Ambulatory Visit (INDEPENDENT_AMBULATORY_CARE_PROVIDER_SITE_OTHER): Payer: Medicaid Other | Admitting: Obstetrics and Gynecology

## 2021-10-28 ENCOUNTER — Encounter: Payer: Self-pay | Admitting: Obstetrics and Gynecology

## 2021-10-28 ENCOUNTER — Other Ambulatory Visit (HOSPITAL_COMMUNITY)
Admission: RE | Admit: 2021-10-28 | Discharge: 2021-10-28 | Disposition: A | Discharge status: Home / Self Care | Payer: Medicaid Other | Source: Ambulatory Visit | Attending: Obstetrics and Gynecology | Admitting: Obstetrics and Gynecology

## 2021-10-28 VITALS — BP 133/89 | HR 121 | Wt 263.8 lb

## 2021-10-28 DIAGNOSIS — O99210 Obesity complicating pregnancy, unspecified trimester: Secondary | ICD-10-CM

## 2021-10-28 DIAGNOSIS — Z3685 Encounter for antenatal screening for Streptococcus B: Secondary | ICD-10-CM

## 2021-10-28 DIAGNOSIS — Z113 Encounter for screening for infections with a predominantly sexual mode of transmission: Secondary | ICD-10-CM

## 2021-10-28 DIAGNOSIS — Z3A38 38 weeks gestation of pregnancy: Secondary | ICD-10-CM | POA: Insufficient documentation

## 2021-10-28 DIAGNOSIS — Z3403 Encounter for supervision of normal first pregnancy, third trimester: Secondary | ICD-10-CM | POA: Diagnosis present

## 2021-10-28 DIAGNOSIS — O321XX0 Maternal care for breech presentation, not applicable or unspecified: Secondary | ICD-10-CM

## 2021-10-28 LAB — POCT URINALYSIS DIPSTICK OB
Bilirubin, UA: NEGATIVE
Blood, UA: NEGATIVE
Glucose, UA: NEGATIVE
Ketones, UA: NEGATIVE
POC,PROTEIN,UA: NEGATIVE
Spec Grav, UA: 1.02 (ref 1.010–1.025)
Urobilinogen, UA: 0.2 E.U./dL
pH, UA: 7 (ref 5.0–8.0)

## 2021-10-28 NOTE — Progress Notes (Signed)
ROB: Patient presents as referral from midwifery service due to findings of breech presentation on recent ultrasound last week.  Initially patient desired C-section however has now reconsidered and is interested in having ECV.  Discussed risks and benefits of ECV in detail, also discussed risks and benefits of C-section.  Patient okay to proceed with scheduling ECV, will schedule for this Thursday at noon.  If version is successful discussed IOL at 39 weeks, if unsuccessful we will schedule for C-section at 39 weeks. Third trimester cultures performed today.

## 2021-10-28 NOTE — Progress Notes (Signed)
ROB. Patient states  She is having pelvic pressure no other questions or concerns at this time she is here for cesarean consult .

## 2021-10-30 ENCOUNTER — Other Ambulatory Visit: Payer: Self-pay

## 2021-10-30 ENCOUNTER — Encounter: Payer: Self-pay | Admitting: Obstetrics and Gynecology

## 2021-10-30 ENCOUNTER — Other Ambulatory Visit: Payer: Self-pay | Admitting: Obstetrics and Gynecology

## 2021-10-30 ENCOUNTER — Observation Stay
Admission: RE | Admit: 2021-10-30 | Discharge: 2021-10-30 | Disposition: A | Discharge status: Home / Self Care | Payer: Medicaid Other | Attending: Obstetrics and Gynecology | Admitting: Obstetrics and Gynecology

## 2021-10-30 DIAGNOSIS — Z3A38 38 weeks gestation of pregnancy: Secondary | ICD-10-CM

## 2021-10-30 DIAGNOSIS — O99213 Obesity complicating pregnancy, third trimester: Secondary | ICD-10-CM

## 2021-10-30 DIAGNOSIS — Z01818 Encounter for other preprocedural examination: Secondary | ICD-10-CM

## 2021-10-30 DIAGNOSIS — O329XX0 Maternal care for malpresentation of fetus, unspecified, not applicable or unspecified: Secondary | ICD-10-CM | POA: Diagnosis not present

## 2021-10-30 DIAGNOSIS — O321XX0 Maternal care for breech presentation, not applicable or unspecified: Secondary | ICD-10-CM

## 2021-10-30 DIAGNOSIS — E669 Obesity, unspecified: Secondary | ICD-10-CM

## 2021-10-30 DIAGNOSIS — O99210 Obesity complicating pregnancy, unspecified trimester: Secondary | ICD-10-CM | POA: Diagnosis present

## 2021-10-30 LAB — SAMPLE TO BLOOD BANK

## 2021-10-30 LAB — CERVICOVAGINAL ANCILLARY ONLY
Chlamydia: NEGATIVE
Comment: NEGATIVE
Comment: NORMAL
Neisseria Gonorrhea: NEGATIVE

## 2021-10-30 LAB — STREP GP B NAA: Strep Gp B NAA: NEGATIVE

## 2021-10-30 MED ORDER — MINERAL OIL PO OIL
TOPICAL_OIL | Freq: Once | ORAL | Status: AC
  Filled 2021-10-30: qty 30

## 2021-10-30 MED ORDER — LACTATED RINGERS IV BOLUS
1000.0000 mL | Freq: Once | INTRAVENOUS | Status: AC
  Administered 2021-10-30: 1000 mL via INTRAVENOUS

## 2021-10-30 MED ORDER — TERBUTALINE SULFATE 1 MG/ML IJ SOLN
0.2500 mg | Freq: Once | INTRAMUSCULAR | Status: AC
  Administered 2021-10-30: 0.25 mg via SUBCUTANEOUS
  Filled 2021-10-30: qty 1

## 2021-10-30 NOTE — Discharge Summary (Addendum)
Obstetric Discharge Summary Reason for Admission:  External Cephalic Version procedure Prenatal Procedures: NST Complications-Operative: none   Hemoglobin  Date Value Ref Range Status  10/22/2021 11.6 11.1 - 15.9 g/dL Final   Hematocrit  Date Value Ref Range Status  10/22/2021 35.4 34.0 - 46.6 % Final    Physical Exam:  Blood pressure (!) 134/93, pulse (!) 129, temperature 97.9 F (36.6 C), temperature source Oral, resp. rate 17, height 5\' 8"  (1.727 m), weight 119.3 kg, last menstrual period 02/03/2021.  General: alert and no distress Lochia: appropriate Uterine Fundus: firm  Fetus A Non-Stress Test Interpretation for 10/30/21  Indication:  Fetal malpresentation, s/p failed ECV  Fetal Heart Rate A Mode: External Baseline Rate (A): 140 bpm (pre-procedure) and 145 bpm (post procedure) Variability: Moderate Accelerations: 15 x 15 Decelerations: None Multiple birth?: No  Uterine Activity Mode: Toco Contraction Frequency (min): None Contraction Duration (sec): 60 Contraction Quality: Mild Resting Tone Palpated: Relaxed Resting Time: Adequate    Discharge Diagnoses:  Term pregnancy, breech presentation  Discharge Information: Date: 10/30/2021 Activity: unrestricted Diet: routine Medications: None Condition: stable Instructions:  Refer to discharge instructions. Patient to f/u Monday for scheduled C-section.  Discharge to: home     Monday, MD 10/30/2021, 1:31 PM

## 2021-10-30 NOTE — Discharge Instructions (Signed)
    Instructions for Scheduled Procedure (Inductions/C-sections and GYN surgeries)   Thank you for choosing [facility] for your services.  You have been scheduled for a procedure called ___Cesarean Section _________________.    Your procedure is scheduled on _______09/18/2023 at 12:00 PM________.    Upon your scheduled procedure date, you will need to arrive at the [facility] entrance. (There is a statue at the front of this entrance.) Please arrive on time if you are scheduled for an induction of labor.  If you are scheduled for a Cesarean delivery or for Gyn Surgery, arrive 2 hours prior to your procedure time.  At this time, patients are allowed 2 support persons to accompany them. Face masks are not required for you and your support person(s). Your support person(s) are now allowed to be there with you during the entire time of your admission.   Please contact the office if you have any questions regarding this information.  The [facility] office number is [phone].     Thank you,    Your [facility]

## 2021-10-30 NOTE — Procedures (Addendum)
EXTERNAL CEPHALIC VERSION Procedure Note   Ann Deleon female 26 y.o. 10/30/2021    Indications: The patient is a 26 y.o. G72P0000 female at [redacted]w[redacted]d with fetal malpresentation (frank breech). The patient has been previously counseled on risks of the procedure including uterine contractions with progression to labor, fetal distress, rupture of membranes, failure of procedure requiring Cesarean delivery, vaginal bleeding, abruption, and fetal death. Consent form for procedure has been signed.   Pre-operative Diagnosis: A [redacted]w[redacted]d week intrauterine pregnancy in breech presentation, obesity in pregnancy (BMI 39.9).    Post-operative Diagnosis: A [redacted]w[redacted]d intrauterine pregnancy in vertex presentation, status post unsuccessful external cephalic version, obesity in pregnancy.   Surgeon: Hildred Laser, MD   Assistants: Doreene Burke, CNM. An experienced assistant was required for fetal manipulation, and retraction.    Anesthesia: None   Procedure Details:  The patient was brought to Labor and Delivery where a reactive fetal heart tracing was obtained. The patient was not noted to have any uterine contractions.. She was given 1 dose of subcutaneous terbutaline  for uterine relaxation and a 1L IVF bolus. A bedside ultrasound was performed which revealed single intrauterine pregnancy and breech presentation. Pre-procedure NST noted to be reassuring with Category 1 tracing. There was noted to be adequate fluid.  Copious mineral oil was used for abdominal lubrication. Using manual pressure, the breech was manipulated in a forward roll fashion x 3, and then a backward rolling fashion x 2, however unsuccessful to complete version.  Fetal heart tones were checked intermittently during the procedure and were noted to be reassuring. Following th procedure, the patient was placed on continuous external fetal monitoring. She was noted to have a reassuring and reactive tracing for 1 hour following the external cephalic  version. She did not have regular contractions and therefore she was felt to be stable for discharge to home. She was given appropriate labor instructions.     Complications: None     Hildred Laser, MD [facility]

## 2021-10-30 NOTE — Progress Notes (Signed)
ECV completed, fhr assessed via bedside ultrasound intermittently during procedure.

## 2021-10-30 NOTE — Progress Notes (Signed)
Reactive tracing 1 hour post ECV. Pt reports +FM. Denies LOF/bleeding. Reports mild soreness but denies ctx. Pt discharged in stable condition with husband. C/s scheduled for noon 11/03/21. Instructed patient to arrive at 9am for admission/labs.

## 2021-10-30 NOTE — Progress Notes (Signed)
MD at bedside for ECV.

## 2021-10-31 ENCOUNTER — Telehealth: Payer: Self-pay

## 2021-10-31 LAB — ABO/RH: ABO/RH(D): O POS

## 2021-10-31 NOTE — Telephone Encounter (Signed)
Called pt and she stated that the hospital called her and told her as well to be there at 9am for preop before c/s and shes aware

## 2021-11-02 MED ORDER — POVIDONE-IODINE 10 % EX SWAB
2.0000 | Freq: Once | CUTANEOUS | Status: AC
  Administered 2021-11-03: 2 via TOPICAL

## 2021-11-02 MED ORDER — LACTATED RINGERS IV SOLN
INTRAVENOUS | Status: DC
  Administered 2021-11-03 (×2): 999 mL via INTRAVENOUS

## 2021-11-02 MED ORDER — ACETAMINOPHEN 500 MG PO TABS
1000.0000 mg | ORAL_TABLET | ORAL | Status: AC
  Administered 2021-11-03: 1000 mg via ORAL
  Filled 2021-11-02: qty 2

## 2021-11-02 MED ORDER — SOD CITRATE-CITRIC ACID 500-334 MG/5ML PO SOLN
30.0000 mL | ORAL | Status: AC
  Administered 2021-11-03: 30 mL via ORAL

## 2021-11-02 MED ORDER — GABAPENTIN 300 MG PO CAPS
300.0000 mg | ORAL_CAPSULE | ORAL | Status: AC
  Administered 2021-11-03: 300 mg via ORAL
  Filled 2021-11-02: qty 1

## 2021-11-02 MED ORDER — BUPIVACAINE LIPOSOME 1.3 % IJ SUSP: Freq: Once | INTRAMUSCULAR | Status: DC

## 2021-11-02 MED ORDER — CEFAZOLIN SODIUM-DEXTROSE 2-4 GM/100ML-% IV SOLN
2.0000 g | INTRAVENOUS | Status: AC
  Administered 2021-11-03: 2 g via INTRAVENOUS
  Filled 2021-11-02: qty 100

## 2021-11-03 ENCOUNTER — Inpatient Hospital Stay: Payer: Medicaid Other | Admitting: Certified Registered"

## 2021-11-03 ENCOUNTER — Other Ambulatory Visit: Payer: Self-pay

## 2021-11-03 ENCOUNTER — Encounter: Payer: Self-pay | Admitting: Obstetrics and Gynecology

## 2021-11-03 ENCOUNTER — Inpatient Hospital Stay
Admission: RE | Admit: 2021-11-03 | Discharge: 2021-11-05 | DRG: 787 | Disposition: A | Discharge status: Home / Self Care | Payer: Medicaid Other | Attending: Obstetrics and Gynecology | Admitting: Obstetrics and Gynecology

## 2021-11-03 ENCOUNTER — Encounter
Admission: RE | Disposition: A | Discharge status: Home / Self Care | Payer: Self-pay | Source: Home / Self Care | Attending: Obstetrics and Gynecology

## 2021-11-03 DIAGNOSIS — Z3A39 39 weeks gestation of pregnancy: Secondary | ICD-10-CM

## 2021-11-03 DIAGNOSIS — O321XX0 Maternal care for breech presentation, not applicable or unspecified: Secondary | ICD-10-CM | POA: Diagnosis present

## 2021-11-03 DIAGNOSIS — E669 Obesity, unspecified: Secondary | ICD-10-CM | POA: Diagnosis not present

## 2021-11-03 DIAGNOSIS — O99344 Other mental disorders complicating childbirth: Secondary | ICD-10-CM | POA: Diagnosis present

## 2021-11-03 DIAGNOSIS — O99214 Obesity complicating childbirth: Secondary | ICD-10-CM | POA: Diagnosis present

## 2021-11-03 DIAGNOSIS — J4530 Mild persistent asthma, uncomplicated: Secondary | ICD-10-CM | POA: Diagnosis present

## 2021-11-03 DIAGNOSIS — O329XX0 Maternal care for malpresentation of fetus, unspecified, not applicable or unspecified: Secondary | ICD-10-CM | POA: Diagnosis present

## 2021-11-03 DIAGNOSIS — O9952 Diseases of the respiratory system complicating childbirth: Secondary | ICD-10-CM | POA: Diagnosis present

## 2021-11-03 DIAGNOSIS — Z98891 History of uterine scar from previous surgery: Secondary | ICD-10-CM

## 2021-11-03 DIAGNOSIS — D62 Acute posthemorrhagic anemia: Secondary | ICD-10-CM | POA: Diagnosis not present

## 2021-11-03 DIAGNOSIS — O99210 Obesity complicating pregnancy, unspecified trimester: Secondary | ICD-10-CM | POA: Diagnosis present

## 2021-11-03 DIAGNOSIS — F411 Generalized anxiety disorder: Secondary | ICD-10-CM | POA: Diagnosis present

## 2021-11-03 DIAGNOSIS — F419 Anxiety disorder, unspecified: Secondary | ICD-10-CM | POA: Diagnosis present

## 2021-11-03 DIAGNOSIS — Z3403 Encounter for supervision of normal first pregnancy, third trimester: Secondary | ICD-10-CM

## 2021-11-03 DIAGNOSIS — Z01818 Encounter for other preprocedural examination: Secondary | ICD-10-CM

## 2021-11-03 DIAGNOSIS — O9081 Anemia of the puerperium: Secondary | ICD-10-CM | POA: Diagnosis not present

## 2021-11-03 DIAGNOSIS — O34219 Maternal care for unspecified type scar from previous cesarean delivery: Secondary | ICD-10-CM

## 2021-11-03 LAB — CBC
HCT: 33.3 % — ABNORMAL LOW (ref 36.0–46.0)
Hemoglobin: 10.8 g/dL — ABNORMAL LOW (ref 12.0–15.0)
MCH: 26 pg (ref 26.0–34.0)
MCHC: 32.4 g/dL (ref 30.0–36.0)
MCV: 80.2 fL (ref 80.0–100.0)
Platelets: 315 10*3/uL (ref 150–400)
RBC: 4.15 MIL/uL (ref 3.87–5.11)
RDW: 13.4 % (ref 11.5–15.5)
WBC: 11.8 10*3/uL — ABNORMAL HIGH (ref 4.0–10.5)
nRBC: 0 % (ref 0.0–0.2)

## 2021-11-03 LAB — TYPE AND SCREEN
ABO/RH(D): O POS
Antibody Screen: NEGATIVE

## 2021-11-03 SURGERY — Surgical Case
Anesthesia: Spinal

## 2021-11-03 MED ORDER — OXYTOCIN-SODIUM CHLORIDE 30-0.9 UT/500ML-% IV SOLN
INTRAVENOUS | Status: AC
  Filled 2021-11-03: qty 1000

## 2021-11-03 MED ORDER — SCOPOLAMINE 1 MG/3DAYS TD PT72
1.0000 | MEDICATED_PATCH | Freq: Once | TRANSDERMAL | Status: DC
  Filled 2021-11-03: qty 1

## 2021-11-03 MED ORDER — ENOXAPARIN SODIUM 40 MG/0.4ML IJ SOSY
40.0000 mg | PREFILLED_SYRINGE | INTRAMUSCULAR | Status: DC
  Administered 2021-11-04: 40 mg via SUBCUTANEOUS
  Filled 2021-11-03 (×2): qty 0.4

## 2021-11-03 MED ORDER — OXYCODONE HCL 5 MG PO TABS
5.0000 mg | ORAL_TABLET | Freq: Four times a day (QID) | ORAL | Status: DC | PRN
  Administered 2021-11-03 – 2021-11-04 (×2): 5 mg via ORAL
  Filled 2021-11-03: qty 1

## 2021-11-03 MED ORDER — KETOROLAC TROMETHAMINE 30 MG/ML IJ SOLN: 30.0000 mg | Freq: Four times a day (QID) | INTRAMUSCULAR | Status: AC | PRN

## 2021-11-03 MED ORDER — DEXAMETHASONE SODIUM PHOSPHATE 4 MG/ML IJ SOLN
INTRAMUSCULAR | Status: DC | PRN
  Administered 2021-11-03: 10 mg via INTRAVENOUS

## 2021-11-03 MED ORDER — DIPHENHYDRAMINE HCL 25 MG PO CAPS: 25.0000 mg | ORAL_CAPSULE | ORAL | Status: DC | PRN

## 2021-11-03 MED ORDER — OXYTOCIN-SODIUM CHLORIDE 30-0.9 UT/500ML-% IV SOLN
INTRAVENOUS | Status: DC | PRN
  Administered 2021-11-03: 250 mL/h via INTRAVENOUS

## 2021-11-03 MED ORDER — KETOROLAC TROMETHAMINE 30 MG/ML IJ SOLN
INTRAMUSCULAR | Status: AC
  Filled 2021-11-03: qty 1

## 2021-11-03 MED ORDER — SIMETHICONE 80 MG PO CHEW: 80.0000 mg | CHEWABLE_TABLET | ORAL | Status: DC | PRN

## 2021-11-03 MED ORDER — PHENYLEPHRINE HCL (PRESSORS) 10 MG/ML IV SOLN
INTRAVENOUS | Status: AC
  Filled 2021-11-03: qty 1

## 2021-11-03 MED ORDER — BUPIVACAINE LIPOSOME 1.3 % IJ SUSP
INTRAMUSCULAR | Status: AC
  Filled 2021-11-03: qty 20

## 2021-11-03 MED ORDER — COCONUT OIL OIL: 1.0000 | TOPICAL_OIL | Status: DC | PRN

## 2021-11-03 MED ORDER — DIBUCAINE (PERIANAL) 1 % EX OINT: 1.0000 | TOPICAL_OINTMENT | CUTANEOUS | Status: DC | PRN

## 2021-11-03 MED ORDER — DIPHENHYDRAMINE HCL 25 MG PO CAPS: 25.0000 mg | ORAL_CAPSULE | Freq: Four times a day (QID) | ORAL | Status: DC | PRN

## 2021-11-03 MED ORDER — MAGNESIUM HYDROXIDE 400 MG/5ML PO SUSP: 30.0000 mL | ORAL | Status: DC | PRN

## 2021-11-03 MED ORDER — OXYCODONE HCL 5 MG PO TABS
5.0000 mg | ORAL_TABLET | ORAL | Status: DC | PRN
  Administered 2021-11-04: 10 mg via ORAL
  Administered 2021-11-04 – 2021-11-05 (×2): 5 mg via ORAL
  Administered 2021-11-05: 10 mg via ORAL
  Filled 2021-11-03 (×7): qty 1

## 2021-11-03 MED ORDER — DIPHENHYDRAMINE HCL 50 MG/ML IJ SOLN: 12.5000 mg | INTRAMUSCULAR | Status: DC | PRN

## 2021-11-03 MED ORDER — PHENYLEPHRINE HCL-NACL 20-0.9 MG/250ML-% IV SOLN
INTRAVENOUS | Status: DC | PRN
  Administered 2021-11-03: 80 ug via INTRAVENOUS
  Administered 2021-11-03: 40 ug/min via INTRAVENOUS

## 2021-11-03 MED ORDER — PRENATAL MULTIVITAMIN CH
1.0000 | ORAL_TABLET | Freq: Every day | ORAL | Status: DC
  Administered 2021-11-04 – 2021-11-05 (×2): 1 via ORAL
  Filled 2021-11-03 (×2): qty 1

## 2021-11-03 MED ORDER — ONDANSETRON HCL 4 MG/2ML IJ SOLN: 4.0000 mg | Freq: Three times a day (TID) | INTRAMUSCULAR | Status: DC | PRN

## 2021-11-03 MED ORDER — MENTHOL 3 MG MT LOZG: 1.0000 | LOZENGE | OROMUCOSAL | Status: DC | PRN

## 2021-11-03 MED ORDER — LACTATED RINGERS IV SOLN: INTRAVENOUS | Status: DC

## 2021-11-03 MED ORDER — BUPIVACAINE HCL (PF) 0.5 % IJ SOLN
INTRAMUSCULAR | Status: AC
  Filled 2021-11-03: qty 30

## 2021-11-03 MED ORDER — GABAPENTIN 300 MG PO CAPS
300.0000 mg | ORAL_CAPSULE | Freq: Two times a day (BID) | ORAL | Status: DC
  Administered 2021-11-03 – 2021-11-05 (×4): 300 mg via ORAL
  Filled 2021-11-03 (×4): qty 1

## 2021-11-03 MED ORDER — IBUPROFEN 600 MG PO TABS
600.0000 mg | ORAL_TABLET | Freq: Four times a day (QID) | ORAL | Status: DC
  Administered 2021-11-04 – 2021-11-05 (×4): 600 mg via ORAL
  Filled 2021-11-03 (×4): qty 1

## 2021-11-03 MED ORDER — SOD CITRATE-CITRIC ACID 500-334 MG/5ML PO SOLN
ORAL | Status: AC
  Filled 2021-11-03: qty 15

## 2021-11-03 MED ORDER — NALOXONE HCL 4 MG/10ML IJ SOLN: 1.0000 ug/kg/h | INTRAVENOUS | Status: DC | PRN

## 2021-11-03 MED ORDER — ACETAMINOPHEN 500 MG PO TABS
1000.0000 mg | ORAL_TABLET | Freq: Four times a day (QID) | ORAL | Status: AC
  Filled 2021-11-03: qty 2

## 2021-11-03 MED ORDER — ONDANSETRON HCL 4 MG/2ML IJ SOLN
INTRAMUSCULAR | Status: DC | PRN
  Administered 2021-11-03: 4 mg via INTRAVENOUS

## 2021-11-03 MED ORDER — SODIUM CHLORIDE (PF) 0.9 % IJ SOLN
INTRAMUSCULAR | Status: AC
  Filled 2021-11-03: qty 50

## 2021-11-03 MED ORDER — SODIUM CHLORIDE 0.9% FLUSH: 3.0000 mL | INTRAVENOUS | Status: DC | PRN

## 2021-11-03 MED ORDER — SODIUM CHLORIDE FLUSH 0.9 % IV SOLN
INTRAVENOUS | Status: DC | PRN
  Administered 2021-11-03: 100 mL

## 2021-11-03 MED ORDER — MORPHINE SULFATE (PF) 0.5 MG/ML IJ SOLN
INTRAMUSCULAR | Status: DC | PRN
  Administered 2021-11-03: .1 mg via INTRATHECAL

## 2021-11-03 MED ORDER — ACETAMINOPHEN 500 MG PO TABS
1000.0000 mg | ORAL_TABLET | Freq: Four times a day (QID) | ORAL | Status: DC
  Administered 2021-11-03 – 2021-11-05 (×8): 1000 mg via ORAL
  Filled 2021-11-03 (×7): qty 2

## 2021-11-03 MED ORDER — MORPHINE SULFATE (PF) 0.5 MG/ML IJ SOLN
INTRAMUSCULAR | Status: AC
  Filled 2021-11-03: qty 10

## 2021-11-03 MED ORDER — KETOROLAC TROMETHAMINE 30 MG/ML IJ SOLN
INTRAMUSCULAR | Status: DC | PRN
  Administered 2021-11-03: 30 mg via INTRAVENOUS

## 2021-11-03 MED ORDER — HYDROMORPHONE HCL 1 MG/ML IJ SOLN: 1.0000 mg | INTRAMUSCULAR | Status: DC | PRN

## 2021-11-03 MED ORDER — MEPERIDINE HCL 25 MG/ML IJ SOLN: 6.2500 mg | INTRAMUSCULAR | Status: DC | PRN

## 2021-11-03 MED ORDER — WITCH HAZEL-GLYCERIN EX PADS: 1.0000 | MEDICATED_PAD | CUTANEOUS | Status: DC | PRN

## 2021-11-03 MED ORDER — KETOROLAC TROMETHAMINE 30 MG/ML IJ SOLN
30.0000 mg | Freq: Four times a day (QID) | INTRAMUSCULAR | Status: AC
  Administered 2021-11-03 – 2021-11-04 (×4): 30 mg via INTRAVENOUS
  Filled 2021-11-03 (×4): qty 1

## 2021-11-03 MED ORDER — TRAMADOL HCL 50 MG PO TABS: 50.0000 mg | ORAL_TABLET | Freq: Four times a day (QID) | ORAL | Status: DC | PRN

## 2021-11-03 MED ORDER — BUPIVACAINE IN DEXTROSE 0.75-8.25 % IT SOLN
INTRATHECAL | Status: DC | PRN
  Administered 2021-11-03: 1.6 mL via INTRATHECAL

## 2021-11-03 MED ORDER — FENTANYL CITRATE (PF) 100 MCG/2ML IJ SOLN
INTRAMUSCULAR | Status: DC | PRN
  Administered 2021-11-03: 15 ug via INTRATHECAL

## 2021-11-03 MED ORDER — FENTANYL CITRATE (PF) 100 MCG/2ML IJ SOLN
INTRAMUSCULAR | Status: AC
  Filled 2021-11-03: qty 2

## 2021-11-03 MED ORDER — OXYTOCIN-SODIUM CHLORIDE 30-0.9 UT/500ML-% IV SOLN: 2.5000 [IU]/h | INTRAVENOUS | Status: AC

## 2021-11-03 MED ORDER — NALOXONE HCL 0.4 MG/ML IJ SOLN: 0.4000 mg | INTRAMUSCULAR | Status: DC | PRN

## 2021-11-03 MED ORDER — SENNOSIDES-DOCUSATE SODIUM 8.6-50 MG PO TABS
2.0000 | ORAL_TABLET | Freq: Every day | ORAL | Status: DC
  Administered 2021-11-04 – 2021-11-05 (×2): 2 via ORAL
  Filled 2021-11-03 (×2): qty 2

## 2021-11-03 MED ORDER — ZOLPIDEM TARTRATE 5 MG PO TABS: 5.0000 mg | ORAL_TABLET | Freq: Every evening | ORAL | Status: DC | PRN

## 2021-11-03 MED ORDER — FERROUS SULFATE 325 (65 FE) MG PO TABS
325.0000 mg | ORAL_TABLET | ORAL | Status: DC
  Administered 2021-11-04: 325 mg via ORAL
  Filled 2021-11-03: qty 1

## 2021-11-03 SURGICAL SUPPLY — 27 items
BAG COUNTER SPONGE SURGICOUNT (BAG) ×1 IMPLANT
CHLORAPREP W/TINT 26 (MISCELLANEOUS) ×2 IMPLANT
DRSG TELFA 3X8 NADH STRL (GAUZE/BANDAGES/DRESSINGS) ×1 IMPLANT
ELECT REM PT RETURN 9FT ADLT (ELECTROSURGICAL) ×1
ELECTRODE REM PT RTRN 9FT ADLT (ELECTROSURGICAL) ×1 IMPLANT
EXTRT SYSTEM ALEXIS 17CM (MISCELLANEOUS)
GAUZE SPONGE 4X4 12PLY STRL (GAUZE/BANDAGES/DRESSINGS) ×1 IMPLANT
GLOVE BIO SURGEON STRL SZ 6.5 (GLOVE) ×1 IMPLANT
GLOVE SURG UNDER LTX SZ7 (GLOVE) ×1 IMPLANT
GOWN STRL REUS W/ TWL LRG LVL3 (GOWN DISPOSABLE) ×2 IMPLANT
GOWN STRL REUS W/TWL LRG LVL3 (GOWN DISPOSABLE) ×2
KIT TURNOVER KIT A (KITS) ×1 IMPLANT
MANIFOLD NEPTUNE II (INSTRUMENTS) ×1 IMPLANT
MAT PREVALON FULL STRYKER (MISCELLANEOUS) ×1 IMPLANT
NS IRRIG 1000ML POUR BTL (IV SOLUTION) ×1 IMPLANT
PACK C SECTION AR (MISCELLANEOUS) ×1 IMPLANT
PAD OB MATERNITY 4.3X12.25 (PERSONAL CARE ITEMS) ×1 IMPLANT
PAD PREP 24X41 OB/GYN DISP (PERSONAL CARE ITEMS) ×1 IMPLANT
SCRUB CHG 4% DYNA-HEX 4OZ (MISCELLANEOUS) ×1 IMPLANT
SUT MNCRL AB 4-0 PS2 18 (SUTURE) ×1 IMPLANT
SUT PLAIN 2 0 XLH (SUTURE) IMPLANT
SUT VIC AB 0 CT1 36 (SUTURE) ×4 IMPLANT
SUT VIC AB 3-0 SH 27 (SUTURE) ×1
SUT VIC AB 3-0 SH 27X BRD (SUTURE) ×1 IMPLANT
SYSTEM CONTND EXTRCTN KII BLLN (MISCELLANEOUS) IMPLANT
TRAP FLUID SMOKE EVACUATOR (MISCELLANEOUS) ×1 IMPLANT
WATER STERILE IRR 500ML POUR (IV SOLUTION) ×1 IMPLANT

## 2021-11-03 NOTE — Transfer of Care (Signed)
Immediate Anesthesia Transfer of Care Note  Patient: Ann Deleon  Procedure(s) Performed: CESAREAN SECTION  Patient Location: PACU and Mother/Baby  Anesthesia Type:Spinal  Level of Consciousness: awake  Airway & Oxygen Therapy: Patient Spontanous Breathing  Post-op Assessment: Report given to RN and Post -op Vital signs reviewed and stable  Post vital signs: Reviewed and stable  Last Vitals:  Vitals Value Taken Time  BP 112/68 1349  Temp 35.8 1349  Pulse 74 1349  Resp 18 1349  SpO2 97 1349    Last Pain:  Vitals:   11/03/21 0953  TempSrc:   PainSc: 0-No pain         Complications: No notable events documented.

## 2021-11-03 NOTE — H&P (Signed)
Obstetric Preoperative History and Physical  Ann Deleon is a 26 y.o. G1P0000 with IUP at [redacted]w[redacted]d presenting for presenting for scheduled primary cesarean section for fetal malpresentation (breech).  No acute concerns.   Prenatal Course Source of Care: [facility] with onset of care at 10 weeks Pregnancy complications or risks: Patient Active Problem List   Diagnosis Date Noted   Breech presentation of fetus 10/30/2021   Failed external cephalic version    Breech presentation, no version 10/28/2021   Obesity in pregnancy 10/28/2021   Nausea and vomiting in pregnancy 10/23/2021   Supervision of normal first pregnancy in third trimester 08/27/2021   Recurrent major depressive disorder, in full remission ([place]) 05/13/2018   GAD (generalized anxiety disorder) 12/16/2016   Obesity (BMI 30.0-34.9) 12/16/2016   Contraception management 06/15/2016   Mild episode of recurrent major depressive disorder ([place]) 06/15/2016   History of concussion 05/05/2016   Contusion of other part of head 05/05/2016   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   Prenatal labs and studies: ABO, Rh: --/--/O POS (09/18 [id number]) Antibody: NEG (09/18 [id number]) Rubella: 3.15 (03/03 1102) RPR: Non Reactive (07/12 0831)  HBsAg: Negative (03/03 1102)  HIV: Non Reactive (03/03 1102)  [id number]-- (09/12 1631) 1 hr Glucola  normal Genetic screening normal Anatomy US normal   Past Medical History:  Diagnosis Date   Anxiety    Asthma    GERD (gastroesophageal reflux disease)    Sexually active at young age     Past Surgical History:  Procedure Laterality Date   MANDIBLE FRACTURE SURGERY      MANDIBLE SURGERY      OB History  Gravida Para Term Preterm AB Living  1 0 0 0 0 0  SAB IAB Ectopic Multiple Live Births  0 0 0 0 0    # Outcome Date GA Lbr Len/2nd Weight Sex Delivery Anes PTL Lv  1 Current             Social History   Socioeconomic History   Marital status: Married    Spouse name: Not on file   Number of children: Not on file   Years of education: Not on file   Highest education level: Not on file  Occupational History   Not on file  Tobacco Use   Smoking status: Never   Smokeless tobacco: Never  Vaping Use   Vaping Use: Never used  Substance and Sexual Activity   Alcohol use: Not Currently   Drug use: No   Sexual activity: Yes    Birth control/protection: Condom  Other Topics Concern   Not on file  Social History Narrative   Not on file   Social Determinants of Health   Financial Resource Strain: Not on file  Food Insecurity: No Food Insecurity (11/03/2021)   Hunger Vital Sign    Worried About Running Out of Food in the Last Year: Never true    Ran Out of Food in the Last Year: Never true  Transportation Needs: No Transportation Needs (11/03/2021)   PRAPARE - Hydrologist (Medical): No    Lack of Transportation (Non-Medical): No  Physical Activity: Not on file  Stress: Not on file  Social Connections: Not on file    Family History  Problem Relation Age of Onset   Hypertension Mother    Depression Mother    Anxiety disorder Mother    Ovarian cysts Mother    Fibroids Mother    Hypertension Father    Diabetes Father    Nephrolithiasis Father    Depression Father    Breast cancer Neg Hx    Ovarian cancer Neg Hx    Colon cancer Neg Hx     Medications Prior to Admission  Medication Sig Dispense Refill Last Dose   albuterol (PROVENTIL HFA;VENTOLIN HFA) 108 (90 BASE) MCG/ACT inhaler Inhale 2 puffs into the lungs 4 (four) times daily as needed.   Past Month   aspirin EC 81 MG tablet Take 1 tablet (81 mg  total) by mouth daily. Swallow whole.Start at [redacted] weeks pregnant 30 tablet 11 11/02/2021   hydrOXYzine (ATARAX) 25 MG tablet Take 1 tablet (25 mg total) by mouth 3 (three) times daily as needed. 30 tablet 1 11/02/2021   Prenatal Vit-Fe Fumarate-FA (PRENATAL PO) Take by mouth.   11/02/2021   albuterol (PROVENTIL) (2.5 MG/3ML) 0.083% nebulizer solution Take 3 mLs (2.5 mg total) by nebulization every 6 (six) hours as needed for wheezing or shortness of breath. (Patient not taking: Reported on 10/30/2021) 150 mL 2 Not Taking   ondansetron (ZOFRAN-ODT) 4 MG disintegrating tablet Take 1 tablet (4 mg total) by mouth every 8 (eight) hours as needed for nausea or vomiting. (Patient not taking: Reported on 10/30/2021) 20 tablet 0 Not Taking   sertraline (ZOLOFT) 50 MG tablet Take 1 tablet (50 mg total) by mouth daily. (Patient not taking: Reported on 11/03/2021) 30 tablet 3 Not Taking   Spacer/Aero-Holding Chambers (AEROCHAMBER PLUS WITH MASK) inhaler Use as instructed (Patient not taking: Reported on 10/30/2021) 1 each 1 Not Taking    No Known Allergies  Review of Systems: Negative except for what is mentioned in HPI.  Physical Exam: BP 129/80 (BP Location: Left Arm)   Pulse 85   Temp 97.8 F (36.6 C) (Oral)   Resp 18   Ht 5\' 8"  (1.727 m)   Wt 119.3 kg   LMP 02/03/2021 (Exact Date)   BMI 39.99 kg/m  FHR by Doppler: 135 bpm GENERAL: Well-developed, well-nourished female in no acute distress.  LUNGS: Clear to auscultation bilaterally.  HEART: Regular rate and rhythm. ABDOMEN: Soft, nontender, nondistended, gravid, bedside sono confirms breech presentation. PELVIC: Deferred EXTREMITIES: Nontender, no edema, 2+ distal pulses.   Pertinent Labs/Studies:   Results for orders placed or performed during the hospital encounter of 11/03/21 (from the past 72 hour(s))  Type and screen     Status: None   Collection Time: 11/03/21  9:17 AM  Result Value Ref Range   ABO/RH(D) O POS    Antibody Screen NEG     Sample Expiration      11/06/2021,2359 Performed at [facility], [address] [place], [place] [address]   CBC     Status: Abnormal   Collection Time: 11/03/21  9:17 AM  Result Value Ref Range   WBC 11.8 (H) 4.0 - 10.5 K/uL   RBC 4.15 3.87 - 5.11 MIL/uL   Hemoglobin 10.8 (L) 12.0 - 15.0 g/dL   HCT 33.3 (L) 36.0 - 46.0 %   MCV 80.2 80.0 - 100.0 fL   MCH 26.0 26.0 - 34.0 pg   MCHC 32.4 30.0 - 36.0 g/dL   RDW 13.4 11.5 -  15.5 %   Platelets 315 150 - 400 K/uL   nRBC 0.0 0.0 - 0.2 %    Comment: Performed at [facility], [address] [place], [place] [address]    Assessment and Plan :Ann Deleon is a 26 y.o. G1P0000 at [redacted]w[redacted]d being admitted  for scheduled cesarean section delivery . The patient is understanding of the planned procedure and is aware of and accepting of all surgical risks, including but not limited to: bleeding which may require transfusion or reoperation; infection which may require antibiotics; injury to bowel, bladder, ureters or other surrounding organs which may require repair; injury to the fetus; need for additional procedures including hysterectomy in the event of life-threatening complications; placental abnormalities wth subsequent pregnancies; incisional problems; blood clot disorders which may require blood thinners;, and other postoperative/anesthesia complications. The patient is in agreement with the proposed plan, and gives informed written consent for the procedure. All questions have been answered.   Rubie Maid, MD [facility]

## 2021-11-03 NOTE — Anesthesia Procedure Notes (Signed)
Spinal  Patient location during procedure: OR Start time: 11/03/2021 12:12 PM End time: 11/03/2021 12:26 PM Reason for block: surgical anesthesia Staffing Resident/CRNA: Cammie Sickle, CRNA Performed by: Cammie Sickle, CRNA Authorized by: Ilene Qua, MD   Preanesthetic Checklist Completed: patient identified, IV checked, site marked, risks and benefits discussed, surgical consent, monitors and equipment checked, pre-op evaluation and timeout performed Spinal Block Patient position: sitting Prep: Betadine Patient monitoring: heart rate, continuous pulse ox and blood pressure Approach: midline Location: L3-4 Injection technique: single-shot Needle Needle type: Introducer and Pencan  Needle gauge: 24 G Needle length: 10 cm Assessment Events: CSF return Additional Notes Negative paresthesia. Negative blood return. Positive free-flowing CSF. Expiration date of kit checked and confirmed. Patient tolerated procedure well, without complications. Successful on second attempt, no complications noted.

## 2021-11-03 NOTE — Lactation Note (Signed)
This note was copied from a baby's chart. Lactation Consultation Note  Patient Name: Ann Deleon Today's Date: 11/03/2021 Reason for consult: Initial assessment;L&D Initial assessment;Primapara;Term Age:24 hours  Maternal Data Has patient been taught Hand Expression?: Yes Does the patient have breastfeeding experience prior to this delivery?: No  P1, c-section; breech. Nipples appear flat bilaterally. With stimulation and hand expression nipples appear to invert.  Feeding Mother's Current Feeding Choice: Breast Milk  LATCH Score Latch: Repeated attempts needed to sustain latch, nipple held in mouth throughout feeding, stimulation needed to elicit sucking reflex.  Audible Swallowing: A few with stimulation  Type of Nipple: Flat  Comfort (Breast/Nipple): Soft / non-tender  Hold (Positioning): Assistance needed to correctly position infant at breast and maintain latch.  LATCH Score: 6  Multiple attempts to latch with tea cup hold. Baby able to sustain latch with continuous hold, rhythmic suckling pattern. Once hold was released baby immediately let go of breast; nipples did not appear stimulated or everted even after sucking. Nipple shield implemented, baby tongue thrusted initially and was on/off, repositioned for better alignment and under support of the breast, baby sustained the latch and fed continuously.  At end of feeding baby became more irritable and was on/off more and more. Baby taken off breast, settled quietly, colostrum evident in the shield.  Lactation Tools Discussed/Used Tools: Nipple Shields Nipple shield size: 16  Interventions Interventions: Breast feeding basics reviewed;Assisted with latch;Hand express;Breast compression;Adjust position;Support pillows;Education (nipple shield use)  Education given on sandwiching of tissue, nipple rolling to help evert nipple, hand expression and pre-stimulation. Education given and taught hand expression, and  benefits of hand expression. Encouraged frequent feeding attempts of 8-12x in the first 24 hours.  Discharge    Consult Status Consult Status: Follow-up from L&D    Lavonia Drafts 11/03/2021, 3:42 PM

## 2021-11-03 NOTE — Op Note (Addendum)
Cesarean Section Procedure Note  Indications: malpresentation: complete breech  Pre-operative Diagnosis: 39 week 0 day pregnancy, complete breech presentation s/p failed external cephalic version, obesity in pregnancy (BMI 39).  Post-operative Diagnosis: Same  Surgeon: Ann Maid, MD  Assistants:  Roberto Scales, CNM.   Procedure: Primary low transverse Cesarean Section  Anesthesia: Spinal anesthesia  Findings: Female infant Ann Deleon"), cephalic presentation, 9326 grams, with Apgar scores of 8 at one minute and 9 at five minutes. Intact placenta with 3 vessel cord.  Clear amniotic fluid at amniotomy The uterine outline, tubes and ovaries appeared normal.   Procedure Details: The patient was seen in the Holding Room. The risks, benefits, complications, treatment options, and expected outcomes were discussed with the patient.  The patient concurred with the proposed plan, giving informed consent.  The site of surgery properly noted/marked. The patient was taken to the Operating Room, identified as Ann Deleon and the procedure verified as C-Section Delivery. A Time Out was held and the above information confirmed.  After induction of anesthesia, the patient was draped and prepped in the usual sterile manner. Anesthesia was tested and noted to be adequate. A Pfannenstiel incision was made and carried down through the subcutaneous tissue to the fascia. Fascial incision was made and extended transversely. The fascia was separated from the underlying rectus tissue superiorly and inferiorly. The peritoneum was identified and entered. Peritoneal incision was extended longitudinally. The surgical assist was able to provide retraction to allow for clear visualization of surgical site. An Alexis retractor was placed in the abdomen for additional retraction. The utero-vesical peritoneal reflection was incised transversely and the bladder flap was bluntly freed from the lower uterine segment. A low  transverse uterine incision was made. Delivered from breech complete presentation was a 3150 gram Female with Apgar scores of 8 at one minute and 9 at five minutes.  The assistant was able to apply adequate fundal pressure to allow for successful delivery of the fetus. After the umbilical cord was clamped and cut, cord blood was obtained for evaluation. Delayed cord clamping was observed. The placenta was removed intact and appeared normal. The uterus was exteriorized and cleared of all clots and debris. The uterine outline, tubes and ovaries appeared normal.  The uterine incision was closed with running locked sutures of 0-Vicryl.  A second suture of 0-Vicryl was used in an imbricating layer.  Hemostasis was observed. The uterus was then returned to the abdomen. The pericolic gutters were cleared of all clots and debris. The fascia was then grasped with Fransisca Kaufmann and Claiborne Billings clamps, and injected with a total of 60 ml of 1.3% Exparel solution (20 ml of bupivicaine liposomal mixed with 30 ml of 0.5% Marcaine and diluted in 50 ml of normal saline).  The fascia was then reapproximated with a running suture of 0-Vicryl. The subcutaneous fat layer was reapproximated with 2-0 Vicryl. The skin was reapproximated with 4-0 Monocryl. The skin and subcutaneous tissues were then injected with an additional 40 ml of the Exparel solution. The incision was covered with steri-strips and a pressure dressing.   Instrument, sponge, and needle counts were correct prior the abdominal closure and at the conclusion of the case.    An experienced assistant was required given the standard of surgical care given the complexity of the case.  This assistant was needed for exposure, dissection, suctioning, retraction, instrument exchange, and for overall help during the procedure.  Estimated Blood Loss:  385 ml      Drains: foley catheter  to gravity drainage, 50 ml of clear urine at end of the procedure         Total IV Fluids: 1100  ml  Specimens: Cord blood, sent for evaluation         Implants: None         Complications:  None; patient tolerated the procedure well.         Disposition: PACU - hemodynamically stable.         Condition: stable   Hildred Laser, MD [facility]

## 2021-11-03 NOTE — Anesthesia Preprocedure Evaluation (Signed)
Anesthesia Evaluation  Patient identified by MRN, date of birth, ID band Patient awake    Reviewed: Allergy & Precautions, NPO status , Patient's Chart, lab work & pertinent test results  History of Anesthesia Complications Negative for: history of anesthetic complications  Airway Mallampati: III  TM Distance: >3 FB Neck ROM: Full    Dental no notable dental hx.    Pulmonary asthma ,    Pulmonary exam normal breath sounds clear to auscultation       Cardiovascular Exercise Tolerance: Good negative cardio ROS Normal cardiovascular exam Rhythm:Regular Rate:Normal     Neuro/Psych Anxiety negative neurological ROS  negative psych ROS   GI/Hepatic Neg liver ROS, GERD  ,  Endo/Other  negative endocrine ROS  Renal/GU negative Renal ROS  negative genitourinary   Musculoskeletal negative musculoskeletal ROS (+)   Abdominal   Peds negative pediatric ROS (+)  Hematology  (+) Blood dyscrasia, anemia ,   Anesthesia Other Findings   Reproductive/Obstetrics negative OB ROS                             Anesthesia Physical Anesthesia Plan  ASA: 3  Anesthesia Plan: Spinal   Post-op Pain Management: Gabapentin PO (pre-op)*, Tylenol PO (pre-op)* and Toradol IV (intra-op)*   Induction:   PONV Risk Score and Plan: 3 and Ondansetron and Treatment may vary due to age or medical condition  Airway Management Planned: Natural Airway and Nasal Cannula  Additional Equipment:   Intra-op Plan:   Post-operative Plan:   Informed Consent: I have reviewed the patients History and Physical, chart, labs and discussed the procedure including the risks, benefits and alternatives for the proposed anesthesia with the patient or authorized representative who has indicated his/her understanding and acceptance.     Dental Advisory Given  Plan Discussed with: Anesthesiologist, CRNA and Surgeon  Anesthesia Plan  Comments: (Patient reports no bleeding problems and no anticoagulant use.  Plan for spinal with backup GA  Patient consented for risks of anesthesia including but not limited to:  - adverse reactions to medications - damage to eyes, teeth, lips or other oral mucosa - nerve damage due to positioning  - risk of bleeding, infection and or nerve damage from spinal that could lead to paralysis - risk of headache or failed spinal - damage to teeth, lips or other oral mucosa - sore throat or hoarseness - damage to heart, brain, nerves, lungs, other parts of body or loss of life  Patient voiced understanding.)        Anesthesia Quick Evaluation

## 2021-11-03 NOTE — Plan of Care (Signed)
Care plan complete

## 2021-11-04 ENCOUNTER — Encounter: Payer: Self-pay | Admitting: Obstetrics and Gynecology

## 2021-11-04 LAB — RPR: RPR Ser Ql: NONREACTIVE

## 2021-11-04 LAB — CBC
HCT: 27.4 % — ABNORMAL LOW (ref 36.0–46.0)
Hemoglobin: 8.7 g/dL — ABNORMAL LOW (ref 12.0–15.0)
MCH: 26.2 pg (ref 26.0–34.0)
MCHC: 31.8 g/dL (ref 30.0–36.0)
MCV: 82.5 fL (ref 80.0–100.0)
Platelets: 260 10*3/uL (ref 150–400)
RBC: 3.32 MIL/uL — ABNORMAL LOW (ref 3.87–5.11)
RDW: 13.2 % (ref 11.5–15.5)
WBC: 14.5 10*3/uL — ABNORMAL HIGH (ref 4.0–10.5)
nRBC: 0 % (ref 0.0–0.2)

## 2021-11-04 MED ORDER — LACTATED RINGERS IV BOLUS
500.0000 mL | Freq: Once | INTRAVENOUS | Status: AC
  Administered 2021-11-04: 500 mL via INTRAVENOUS

## 2021-11-04 MED ORDER — SERTRALINE HCL 25 MG PO TABS
50.0000 mg | ORAL_TABLET | Freq: Every day | ORAL | Status: DC
  Administered 2021-11-04 – 2021-11-05 (×2): 50 mg via ORAL
  Filled 2021-11-04 (×2): qty 2

## 2021-11-04 MED ORDER — ONDANSETRON HCL 4 MG PO TABS
4.0000 mg | ORAL_TABLET | Freq: Three times a day (TID) | ORAL | Status: DC | PRN
  Administered 2021-11-04: 4 mg via ORAL
  Filled 2021-11-04: qty 1

## 2021-11-04 MED ORDER — BACITRACIN-NEOMYCIN-POLYMYXIN OINTMENT TUBE
TOPICAL_OINTMENT | Freq: Three times a day (TID) | CUTANEOUS | Status: DC | PRN
  Administered 2021-11-04: 1 via TOPICAL
  Filled 2021-11-04: qty 14.17

## 2021-11-04 NOTE — Progress Notes (Signed)
Subjective: Postpartum Day 0: Cesarean Delivery Ann Deleon feels sore and tired but well overall. Her pain is well-controlled. She as been up in the room and has voided without difficulty. She is breastfeeding with a nipple shield. She is tolerating POs and her bleeding is minimal.    Objective: Vital signs in last 24 hours: Temp:  [97.5 F (36.4 C)-98.2 F (36.8 C)] 98.2 F (36.8 C) (09/19 0804) Pulse Rate:  [63-87] 68 (09/19 0804) Resp:  [13-18] 18 (09/19 0804) BP: (102-142)/(62-84) 116/69 (09/19 0804) SpO2:  [96 %-100 %] 99 % (09/19 0804) Weight:  [119.3 kg] 119.3 kg (09/18 0947)  Physical Exam:  General: alert, cooperative, and appears stated age Heart: RRR Lungs: CTAB Abdomen: soft, non-tender, normal bowel sounds Lochia: appropriate Uterine Fundus: firm Incision: dressing c/d/i DVT Evaluation: No evidence of DVT seen on physical exam.  Recent Labs    11/03/21 0917 11/04/21 0517  HGB 10.8* 8.7*  HCT 33.3* 27.4*    Assessment/Plan: Status post Cesarean section. Doing well postoperatively.  Continue current care Lactation support Encourage ambulation   Lurlean Horns, CNM 11/04/2021, 8:48 AM

## 2021-11-04 NOTE — Lactation Note (Signed)
This note was copied from a baby's chart. Lactation Consultation Note  Patient Name: Ann Deleon Today's Date: 11/04/2021 Reason for consult: Follow-up assessment;Primapara;Term (Mom using nipple shields) Age:24 hours  Maternal Data This is mom's first baby, C/S primary for malpresentation. Mom with history of anxiety. Mom's goal is to breastfeed her baby. Mom with questions about fit and use of her motif pump. Parents with questions regarding frequency of feeds and some breastfeeding basics clarifications of what  to expect in the first few days. Parents report baby began clusterfeeding this afternoon. Has patient been taught Hand Expression?: Yes Does the patient have breastfeeding experience prior to this delivery?: No  Feeding Mother's Current Feeding Choice: Breast Milk  Lactation Tools Discussed/Used  Assisted mom with use of her motif pump. Set mom up with DEBP as she is using nipple shield to breastfeed. Discussed with mom when using nipple shields to breastfeed to consider post pumping for increased stimulation until her milk supply is established.Discussed the shield can be a barrier that potentially decreases stimulation which can effect milk supply. Once mom's milk supply is established mom understands she does not need to continue with post pumping.  Interventions Interventions: Breast feeding basics reviewed;DEBP;Education (Mom brought Motif pump from home and requested assistance with use. Assisted mom with DEBP to post pump in daylight hours as mom is using nipple shield to breastfeed.)  Discharge Discharge Education: Engorgement and breast care;Warning signs for feeding baby (Mom aware of [place] lactation if she needs BF assistance once she goes home.) Pump: Personal  Consult Status Consult Status: Follow-up Date: 11/05/21 Follow-up type: In-patient  Update provided to care nurse.  Ann Deleon 11/04/2021, 4:25 PM

## 2021-11-04 NOTE — Anesthesia Postprocedure Evaluation (Signed)
Anesthesia Post Note  Patient: Ann Deleon  Procedure(s) Performed: [place]  Patient location during evaluation: Mother Baby Anesthesia Type: Spinal Level of consciousness: awake Respiratory status: spontaneous breathing Postop Assessment: no headache Anesthetic complications: no   No notable events documented.   Last Vitals:  Vitals:   11/04/21 0804 11/04/21 1000  BP: 116/69   Pulse: 68 74  Resp: 18   Temp: 36.8 C   SpO2: 99% 97%    Last Pain:  Vitals:   11/04/21 0804  TempSrc: Oral  PainSc:                  Lerry Liner

## 2021-11-04 NOTE — Progress Notes (Signed)
1 Day Post-Op s/p C-section (primary)  Subjective/Chief Complaint: Feels tired, but overall doing well. Pain is well controlled. Working on breastfeeding.    Objective: Vital signs in last 24 hours: Temp:  [97.5 F (36.4 C)-98.2 F (36.8 C)] 98.2 F (36.8 C) (09/19 0804) Pulse Rate:  [63-87] 74 (09/19 1000) Resp:  [13-18] 18 (09/19 0804) BP: (102-142)/(62-84) 116/69 (09/19 0804) SpO2:  [96 %-100 %] 97 % (09/19 1000)    Intake/Output from previous day: 09/18 0701 - 09/19 0700 In: 2125.7 [I.V.:1525.7; IV Piggyback:600] Out: 1655 [Urine:1250; Blood:405] Intake/Output this shift: No intake/output data recorded.  General appearance: alert and no distress Remainder of exam deferred as patient is currently breastfeeding. Also, see exam performed by CNM earlier this morning.    Lab Results:  Recent Labs    11/03/21 0917 11/04/21 0517  WBC 11.8* 14.5*  HGB 10.8* 8.7*  HCT 33.3* 27.4*  PLT 315 260      Assessment/Plan: s/p Procedure(s): CESAREAN SECTION (N/A) Regular diet Encourage ambulation Lactation support Resume Zoloft for ho anxiety Discuss contraception postpartum Anemia of pregnancy, asymptomatic. Treat with PO iron.  D/c home in a.m.    LOS: 1 day    Rubie Maid 11/04/2021

## 2021-11-05 MED ORDER — OXYCODONE HCL 5 MG PO TABS: 5.0000 mg | ORAL_TABLET | Freq: Four times a day (QID) | ORAL | 0 refills | Status: AC | PRN

## 2021-11-05 NOTE — Discharge Summary (Signed)
OB Discharge Summary     Patient Name: Ann Deleon DOB: 1995/03/19 MRN: [id number]  Date of admission: 11/03/2021 Delivering MD: Hildred Laser  Date of Delivery: 11/03/2021  Date of discharge: 11/05/2021  Admitting diagnosis: S/P cesarean section [Z98.891] Intrauterine pregnancy: [redacted]w[redacted]d     Secondary diagnosis: None     Discharge diagnosis: Term Pregnancy Delivered                                                                                                Post partum procedures: none  Augmentation: N/A  Complications: None  Hospital course:  Sceduled C/S   26 y.o. yo G1P1001 at [redacted]w[redacted]d was admitted to the hospital 11/03/2021 for scheduled cesarean section with the following indication:Malpresentation.Delivery details are as follows:  Membrane Rupture Time/Date: 12:57 PM ,11/03/2021   Delivery Method:C-Section, Low Transverse  Details of operation can be found in separate operative note.  Patient had an uncomplicated postpartum course.  She is ambulating, tolerating a regular diet, passing flatus, and urinating well. Patient is discharged home in stable condition on  11/05/21        Newborn Data: Birth date:11/03/2021  Birth time:12:59 PM  Gender:Female  Living status:Living  Apgars:8 ,9  Weight:3150 g     Physical exam  Vitals:   11/04/21 1100 11/04/21 1615 11/04/21 2327 11/05/21 0811  BP:  128/74 127/83 124/78  Pulse: 72 75 70 71  Resp:  20 20 18   Temp:  98.2 F (36.8 C) 97.8 F (36.6 C) 98.2 F (36.8 C)  TempSrc:  Oral Oral Oral  SpO2: 97%  99% 98%  Weight:      Height:       General: alert, cooperative, and no distress Lochia: appropriate Uterine Fundus: firm Incision: Healing well with no significant drainage DVT Evaluation: No evidence of DVT seen on physical exam.  Labs: Lab Results  Component Value Date   WBC 14.5 (H) 11/04/2021   HGB 8.7 (L) 11/04/2021   HCT 27.4 (L) 11/04/2021   MCV 82.5 11/04/2021   PLT 260 11/04/2021    Discharge instruction:  per After Visit Summary.  Medications:  Allergies as of 11/05/2021   No Known Allergies      Medication List     STOP taking these medications    aspirin EC 81 MG tablet   ondansetron 4 MG disintegrating tablet Commonly known as: ZOFRAN-ODT       TAKE these medications    aerochamber plus with mask inhaler Use as instructed   albuterol 108 (90 Base) MCG/ACT inhaler Commonly known as: VENTOLIN HFA Inhale 2 puffs into the lungs 4 (four) times daily as needed.   albuterol (2.5 MG/3ML) 0.083% nebulizer solution Commonly known as: PROVENTIL Take 3 mLs (2.5 mg total) by nebulization every 6 (six) hours as needed for wheezing or shortness of breath.   hydrOXYzine 25 MG tablet Commonly known as: ATARAX Take 1 tablet (25 mg total) by mouth 3 (three) times daily as needed.   oxyCODONE 5 MG immediate release tablet Commonly known as: Oxy IR/ROXICODONE Take 1 tablet (5 mg total) by mouth every 6 (six)  hours as needed for up to 5 days for severe pain.   PRENATAL PO Take by mouth.   sertraline 50 MG tablet Commonly known as: Zoloft Take 1 tablet (50 mg total) by mouth daily.               Discharge Care Instructions  (From admission, onward)           Start     Ordered   11/05/21 0000  Discharge wound care:       Comments: Keep incision dry, clean.   11/05/21 1218            Diet: routine diet  Activity: Advance as tolerated. Pelvic rest for 6 weeks.   Outpatient follow up:     Postpartum contraception: Undecided Rhogam Given postpartum: Rh positive Rubella vaccine given postpartum: immune Varicella vaccine given postpartum: immune TDaP given antepartum or postpartum: given antepartum   Newborn Delivery   Birth date/time: 11/03/2021 12:59:00 Delivery type: C-Section, Low Transverse Trial of labor: No C-section categorization: Primary      Baby Feeding: Breast  Disposition:home with mother  SIGNED:  Rod Can, CNM 11/05/2021  12:21 PM

## 2021-11-05 NOTE — Lactation Note (Signed)
This note was copied from a baby's chart. Lactation Consultation Note  Patient Name: Ann Deleon Today's Date: 11/05/2021 Reason for consult: Follow-up assessment;Primapara;Term;Other (Comment) (c-section; breech) Age:24 hours  Maternal Data Has patient been taught Hand Expression?: Yes Does the patient have breastfeeding experience prior to this delivery?: No  Feeding Mother's Current Feeding Choice: Breast Milk  Baby has been cluster feeding on/off all night, but did go a longer period between 2am-7am without feeding. Baby accepting the L breast well, but having trouble with R breast; mom has began to pump that breast for stimulation and milk removal to use as supplement.  LATCH Score   Baby had just finished a feeding and supplement of 12mL EBM via curved tip syringe; asleep in dad's arms.  Lactation Tools Discussed/Used    Interventions Interventions: Hand express;DEBP;Hand pump;Education  Use of breast pump to aid in milk removal, building milk supply. Encouraged use post feedings for additional stimulation. Encouraged feeding on demand, minimum of 8-12 feedings/24 hours, potential for cluster feeding and signs of cluster feeding; waking baby to feed throughout the night.  Discharge Discharge Education: Engorgement and breast care;Outpatient recommendation;Outpatient Epic message sent Pump: Personal (Motif)  Reviewed anticipated breast changes and management of breast fullness/engorgement.   Consult Status Consult Status: PRN  Outpatient lactation service information provided; encouraged to call with questions/concerns.  Lavonia Drafts 11/05/2021, 9:07 AM

## 2021-11-05 NOTE — Progress Notes (Signed)
Obstetric Postpartum/PostOperative Daily Progress Note Subjective:  26 y.o. G1P1001 post-operative day # 2 status post primary cesarean delivery.  She is ambulating, is tolerating po, is voiding spontaneously.  Her pain is well controlled on PO pain medications. Her lochia is less than menses. She reports baby is cluster feeding. She is latching and pumping. [employer] assisting. She would like to discharge later today pending progress with feeding newborn.    Medications SCHEDULED MEDICATIONS   acetaminophen  1,000 mg Oral Q6H   enoxaparin (LOVENOX) injection  40 mg Subcutaneous Q24H   ferrous sulfate  325 mg Oral QODAY   gabapentin  300 mg Oral BID   ibuprofen  600 mg Oral Q6H   prenatal multivitamin  1 tablet Oral Q1200   scopolamine  1 patch Transdermal Once   senna-docusate  2 tablet Oral Daily   sertraline  50 mg Oral Daily    MEDICATION INFUSIONS   lactated ringers Stopped (11/04/21 0650)   naloxone HCl (NARCAN) 2 mg in dextrose 5 % 250 mL infusion      PRN MEDICATIONS  coconut oil, witch hazel-glycerin **AND** dibucaine, diphenhydrAMINE **OR** diphenhydrAMINE, diphenhydrAMINE, HYDROmorphone (DILAUDID) injection, magnesium hydroxide, menthol-cetylpyridinium, naloxone **AND** sodium chloride flush, naloxone HCl (NARCAN) 2 mg in dextrose 5 % 250 mL infusion, neomycin-bacitracin-polymyxin, ondansetron (ZOFRAN) IV, ondansetron, oxyCODONE, oxyCODONE, simethicone, traMADol, zolpidem    Objective:   Vitals:   11/04/21 1100 11/04/21 1615 11/04/21 2327 11/05/21 0811  BP:  128/74 127/83 124/78  Pulse: 72 75 70 71  Resp:  20 20 18   Temp:  98.2 F (36.8 C) 97.8 F (36.6 C) 98.2 F (36.8 C)  TempSrc:  Oral Oral Oral  SpO2: 97%  99% 98%  Weight:      Height:        Current Vital Signs 24h Vital Sign Ranges  T 98.2 F (36.8 C) Temp  Avg: 98.1 F (36.7 C)  Min: 97.8 F (36.6 C)  Max: 98.2 F (36.8 C)  BP 124/78 BP  Min: 124/78  Max: 128/74  HR 71 Pulse  Avg: 72.4  Min: 70  Max: 75  RR  18 Resp  Avg: 19.3  Min: 18  Max: 20  SaO2 98 % Room Air SpO2  Avg: 97.8 %  Min: 97 %  Max: 99 %       24 Hour I/O Current Shift I/O  Time Ins Outs No intake/output data recorded. No intake/output data recorded.  General: NAD Pulmonary: no increased work of breathing Abdomen: non-distended, non-tender, fundus firm at level of umbilicus Inc: Clean/dry/intact Extremities: no edema, no erythema, no tenderness  Labs:  Recent Labs  Lab 11/03/21 0917 11/04/21 0517  WBC 11.8* 14.5*  HGB 10.8* 8.7*  HCT 33.3* 27.4*  PLT 315 260     Assessment:   26 y.o. G1P1001 postoperative day # 2 status post primary cesarean section, lactating  Plan:  1) Acute blood loss anemia - hemodynamically stable and asymptomatic - po ferrous sulfate  2) O POS / Rubella 3.15 (03/03 1102)/ Varicella Immune  3) TDAP status: given antepartum  4) Breastfeeding  5) Contraception =  undecided  6) Disposition: continue current care, may discharge later today pending feeding   Rod Can, CNM 11/05/2021 9:02 AM

## 2021-11-05 NOTE — Progress Notes (Signed)
Pt discharged with infant.  Discharge instructions, prescriptions and follow up appointment given to and reviewed with pt. Pt verbalized understanding. Escorted out by auxillary. 

## 2021-11-10 ENCOUNTER — Encounter: Payer: Self-pay | Admitting: Obstetrics and Gynecology

## 2021-11-12 ENCOUNTER — Ambulatory Visit (INDEPENDENT_AMBULATORY_CARE_PROVIDER_SITE_OTHER): Payer: Medicaid Other | Admitting: Obstetrics and Gynecology

## 2021-11-12 ENCOUNTER — Encounter: Payer: Self-pay | Admitting: Obstetrics and Gynecology

## 2021-11-12 VITALS — BP 143/83 | HR 79 | Resp 16 | Ht 68.0 in | Wt 250.5 lb

## 2021-11-12 DIAGNOSIS — Z4889 Encounter for other specified surgical aftercare: Secondary | ICD-10-CM

## 2021-11-12 NOTE — Progress Notes (Signed)
    OBSTETRICS/GYNECOLOGY POST-OPERATIVE CLINIC VISIT  Subjective:     Ann Deleon is a 26 y.o. female who presents to the clinic 1 weeks status post CESAREAN SECTION for Breech presentation. Eating a regular diet without difficulty. Bowel movements are normal. Pain is not well controlled.  Medications being used: acetaminophen and ibuprofen (OTC). Reports baby is doing well with feeding (formula). Does note being somewhat emotional over the last few days.   The following portions of the patient's history were reviewed and updated as appropriate: allergies, current medications, past family history, past medical history, past social history, past surgical history, and problem list.  Review of Systems Pertinent items noted in HPI and remainder of comprehensive ROS otherwise negative.   Objective:   BP (!) 143/83   Pulse 79   Resp 16   Ht 5\' 8"  (1.727 m)   Wt 250 lb 8 oz (113.6 kg)   BMI 38.09 kg/m  Body mass index is 38.09 kg/m.  General:  alert and no distress  Abdomen: soft, bowel sounds active, non-tender  Incision:   healing well, no drainage, no erythema, no hernia, no seroma, no swelling, no dehiscence, incision well approximated.        11/12/2021   12:18 PM 11/04/2021    1:50 AM 11/03/2021    8:00 PM  Edinburgh Postnatal Depression Scale Screening Tool  I have been able to laugh and see the funny side of things. 0    I have looked forward with enjoyment to things. 0    I have blamed myself unnecessarily when things went wrong. 0    I have been anxious or worried for no good reason. 2    I have felt scared or panicky for no good reason. 0    Things have been getting on top of me. 1    I have been so unhappy that I have had difficulty sleeping. 0    I have felt sad or miserable. 2    I have been so unhappy that I have been crying. 1    The thought of harming myself has occurred to me. 0    Edinburgh Postnatal Depression Scale Total 6       Information is  confidential and restricted. Go to Review Flowsheets to unlock data.         Pathology:   None  Assessment:   Patient s/p CESAREAN SECTION  (surgery)  Doing well postoperatively.   Plan:   1. Continue any current medications as instructed by provider. 2. Wound care discussed. 3. Operative findings again reviewed. Pathology report discussed. 4. Activity restrictions: no bending, stooping, or squatting, no lifting more than 10-15 pounds, and pelvic rest x 4-5 weeks 5. Anticipated return to work:  5-6 weeks . 6. Follow up: 5 weeks for postpartum visit 7. Flu Vaccine: Declined   Rubie Maid, MD [facility]

## 2021-11-12 NOTE — Patient Instructions (Signed)

## 2021-11-24 ENCOUNTER — Ambulatory Visit (INDEPENDENT_AMBULATORY_CARE_PROVIDER_SITE_OTHER): Payer: Medicaid Other | Admitting: Obstetrics and Gynecology

## 2021-11-24 VITALS — BP 143/81 | HR 77 | Ht 68.0 in | Wt 246.2 lb

## 2021-11-24 DIAGNOSIS — R300 Dysuria: Secondary | ICD-10-CM | POA: Diagnosis not present

## 2021-11-24 DIAGNOSIS — M549 Dorsalgia, unspecified: Secondary | ICD-10-CM

## 2021-11-24 DIAGNOSIS — R8290 Unspecified abnormal findings in urine: Secondary | ICD-10-CM

## 2021-11-24 LAB — POCT URINALYSIS DIPSTICK
Bilirubin, UA: NEGATIVE
Glucose, UA: NEGATIVE
Ketones, UA: NEGATIVE
Nitrite, UA: NEGATIVE
Protein, UA: NEGATIVE
Spec Grav, UA: 1.02 (ref 1.010–1.025)
Urobilinogen, UA: 0.2 E.U./dL
pH, UA: 7 (ref 5.0–8.0)

## 2021-11-24 MED ORDER — SULFAMETHOXAZOLE-TRIMETHOPRIM 800-160 MG PO TABS: 1.0000 | ORAL_TABLET | Freq: Two times a day (BID) | ORAL | 1 refills | Status: DC

## 2021-11-24 NOTE — Progress Notes (Signed)
Patient presents today for urine drop-off. She states since leaving the hospital on 11/05/21 she has had lower back pain, occasional burning with urination and odor. She states tylenol and ibuprofen have not helped. After urine dip was done, UTI was shown. Bactrim sent to patients pharmacy, culture sent as well. No additional questions at this time.

## 2021-11-26 LAB — URINE CULTURE

## 2021-12-16 NOTE — Progress Notes (Unsigned)
OBSTETRICS POSTPARTUM CLINIC PROGRESS NOTE  Subjective:     Ann Deleon is a 26 y.o. G77P1001 female who presents for a postpartum visit. She is 6 weeks postpartum following a CESAREAN SECTION. I have fully reviewed the prenatal and intrapartum course. The delivery was at 23 gestational weeks for breech presentation.  Anesthesia: spinal. Postpartum course has been well. Baby's course has been well. Baby is feeding by bottle - Similac Advance. Bleeding: patient has not not resumed menses, with No LMP recorded (lmp unknown).. Bowel function is normal. Bladder function is normal. Patient is not sexually active. Contraception method desired is condoms. Postpartum depression screening: negative.  EDPS score is 0. Notes that she stopped taking her Zoloft due to side effects. Still uses her Atarax as needed for occasional anxiety.   The following portions of the patient's history were reviewed and updated as appropriate: allergies, current medications, past family history, past medical history, past social history, past surgical history, and problem list.  Review of Systems Pertinent items are noted in HPI.   Objective:    BP (!) 117/95   Pulse 79   Resp 16   Ht 5\' 8"  (1.727 m)   Wt 246 lb 14.4 oz (112 kg)   LMP  (LMP Unknown)   Breastfeeding No   BMI 37.54 kg/m   General:  alert and no distress   Breasts:  inspection negative, no nipple discharge or bleeding, no masses or nodularity palpable  Lungs: clear to auscultation bilaterally  Heart:  regular rate and rhythm, S1, S2 normal, no murmur, click, rub or gallop  Abdomen: soft, non-tender; bowel sounds normal; no masses,  no organomegaly.  Well healed Pfannenstiel incision   Vulva:  normal  Vagina: normal vagina, no discharge, exudate, lesion, or erythema  Cervix:  no cervical motion tenderness and no lesions  Corpus: normal size, contour, position, consistency, mobility, non-tender  Adnexa:  normal adnexa and no mass, fullness,  tenderness  Rectal Exam: Not performed.            12/17/2021    1:44 PM 11/12/2021   12:18 PM 11/04/2021    1:50 AM 11/03/2021    8:00 PM  Edinburgh Postnatal Depression Scale Screening Tool  I have been able to laugh and see the funny side of things. 0 0    I have looked forward with enjoyment to things. 0 0    I have blamed myself unnecessarily when things went wrong. 0 0    I have been anxious or worried for no good reason. 0 2    I have felt scared or panicky for no good reason. 0 0    Things have been getting on top of me. 0 1    I have been so unhappy that I have had difficulty sleeping. 0 0    I have felt sad or miserable. 0 2    I have been so unhappy that I have been crying. 0 1    The thought of harming myself has occurred to me. 0 0    Edinburgh Postnatal Depression Scale Total 0 6       Information is confidential and restricted. Go to Review Flowsheets to unlock data.     Labs:  Lab Results  Component Value Date   HGB 8.7 (L) 11/04/2021     Assessment:   1. Postpartum examination following cesarean delivery   2. Unsatisfactory cervical Papanicolaou smear   3. Postpartum anemia   4. Need for immunization  against influenza      Plan:   1. Contraception: condoms 2. Will check Hgb for h/o postpartum anemia of less than 10.  3. Pap smear performed today.  4. Flu vaccine given today.   5. Follow up in: 4-6 months for annual exam or sooner as needed.   Rubie Maid, MD [facility]

## 2021-12-17 ENCOUNTER — Other Ambulatory Visit (HOSPITAL_COMMUNITY)
Admission: RE | Admit: 2021-12-17 | Discharge: 2021-12-17 | Disposition: A | Discharge status: Home / Self Care | Payer: Medicaid Other | Source: Ambulatory Visit | Attending: Obstetrics and Gynecology | Admitting: Obstetrics and Gynecology

## 2021-12-17 ENCOUNTER — Encounter: Payer: Self-pay | Admitting: Obstetrics and Gynecology

## 2021-12-17 ENCOUNTER — Ambulatory Visit (INDEPENDENT_AMBULATORY_CARE_PROVIDER_SITE_OTHER): Payer: Medicaid Other | Admitting: Obstetrics and Gynecology

## 2021-12-17 DIAGNOSIS — R87615 Unsatisfactory cytologic smear of cervix: Secondary | ICD-10-CM | POA: Insufficient documentation

## 2021-12-17 DIAGNOSIS — Z124 Encounter for screening for malignant neoplasm of cervix: Secondary | ICD-10-CM

## 2021-12-17 DIAGNOSIS — O9081 Anemia of the puerperium: Secondary | ICD-10-CM

## 2021-12-17 DIAGNOSIS — Z23 Encounter for immunization: Secondary | ICD-10-CM | POA: Diagnosis not present

## 2021-12-17 DIAGNOSIS — D649 Anemia, unspecified: Secondary | ICD-10-CM

## 2021-12-17 NOTE — Patient Instructions (Signed)

## 2021-12-17 NOTE — Addendum Note (Signed)
Addended by: Chilton Greathouse on: 12/17/2021 04:56 PM   Modules accepted: Orders

## 2021-12-18 LAB — HEMOGLOBIN AND HEMATOCRIT, BLOOD
Hematocrit: 36 % (ref 34.0–46.6)
Hemoglobin: 11.5 g/dL (ref 11.1–15.9)

## 2021-12-22 ENCOUNTER — Encounter: Payer: Self-pay | Admitting: Obstetrics and Gynecology

## 2021-12-22 DIAGNOSIS — R87612 Low grade squamous intraepithelial lesion on cytologic smear of cervix (LGSIL): Secondary | ICD-10-CM | POA: Insufficient documentation

## 2021-12-22 LAB — CYTOLOGY - PAP

## 2022-04-08 ENCOUNTER — Ambulatory Visit
Admission: EM | Admit: 2022-04-08 | Discharge: 2022-04-08 | Disposition: A | Discharge status: Home / Self Care | Payer: Medicaid Other | Attending: Urgent Care | Admitting: Urgent Care

## 2022-04-08 DIAGNOSIS — J209 Acute bronchitis, unspecified: Secondary | ICD-10-CM

## 2022-04-08 DIAGNOSIS — J45901 Unspecified asthma with (acute) exacerbation: Secondary | ICD-10-CM | POA: Diagnosis not present

## 2022-04-08 MED ORDER — HYDROCOD POLI-CHLORPHE POLI ER 10-8 MG/5ML PO SUER: 5.0000 mL | Freq: Two times a day (BID) | ORAL | 0 refills | Status: AC | PRN

## 2022-04-08 MED ORDER — PREDNISONE 20 MG PO TABS: 60.0000 mg | ORAL_TABLET | Freq: Every day | ORAL | 0 refills | Status: AC

## 2022-04-08 MED ORDER — BENZONATATE 100 MG PO CAPS: ORAL_CAPSULE | ORAL | 0 refills | Status: DC

## 2022-04-08 MED ORDER — AZITHROMYCIN 250 MG PO TABS: ORAL_TABLET | ORAL | 0 refills | Status: DC

## 2022-04-08 NOTE — ED Provider Notes (Signed)
Roderic Palau    CSN: [id number] Arrival date & time: 04/08/22  1706      History   Chief Complaint Chief Complaint  Patient presents with   Cough   Shortness of Breath    HPI Ann Deleon is a 27 y.o. female.    Cough Associated symptoms: shortness of breath   Shortness of Breath Associated symptoms: cough     Presents to urgent care with symptoms x 2 weeks.  She endorses cough and shortness of breath, body aches, ear pain, sore throat which she states is from coughing.  Patient is using Sudafed, ibuprofen as well as inhaler.  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   Allergic rhinitis due to pollen 04/12/2015   Asthma, mild persistent 07/06/2014   Dermatitis, eczematoid 07/06/2014   Anxiety 07/06/2014    Past Surgical History:  Procedure Laterality Date   CESAREAN SECTION N/A 11/03/2021   Procedure: CESAREAN SECTION;  Surgeon: Rubie Maid, MD;  Location: [facility] ORS;  Service: Obstetrics;  Laterality: N/A;   MANDIBLE FRACTURE SURGERY     MANDIBLE SURGERY      OB History     Gravida  1   Para  1   Term  1   Preterm  0   AB  0   Living  1      SAB  0   IAB  0   Ectopic  0   Multiple  0   Live Births  1            Home Medications    Prior to Admission medications   Medication Sig Start Date End Date Taking? Authorizing Provider  albuterol (PROVENTIL HFA;VENTOLIN HFA) 108 (90 BASE) MCG/ACT inhaler Inhale 2 puffs into the lungs 4 (four) times daily as needed. 06/05/13  Yes Krebs, Amy  Lauren, NP  hydrOXYzine (ATARAX) 25 MG tablet Take 1 tablet (25 mg total) by mouth 3 (three) times daily as needed. 10/22/21  Yes Swanson, Lenna Sciara M, CNM  Spacer/Aero-Holding Chambers (AEROCHAMBER PLUS WITH MASK) inhaler Use as instructed 10/29/15  Yes Karamalegos, Devonne Doughty, DO  albuterol (PROVENTIL) (2.5 MG/3ML) 0.083% nebulizer solution Take 3 mLs (2.5 mg total) by nebulization every 6 (six) hours as needed for wheezing or shortness of breath. 02/28/16   Karamalegos, Devonne Doughty, DO  sulfamethoxazole-trimethoprim (BACTRIM DS) 800-160 MG tablet Take 1 tablet by mouth 2 (two) times daily. 11/24/21   Rubie Maid, MD    Family History Family History  Problem Relation Age of Onset   Hypertension Mother    Depression Mother    Anxiety disorder Mother    Ovarian cysts Mother    Fibroids Mother    Hypertension Father    Diabetes Father    Nephrolithiasis Father    Depression Father    Breast cancer Neg Hx    Ovarian cancer Neg Hx    Colon cancer Neg Hx     Social History Social History   Tobacco Use   Smoking status: Never  Smokeless tobacco: Never  Vaping Use   Vaping Use: Never used  Substance Use Topics   Alcohol use: Not Currently   Drug use: No     Allergies   Patient has no known allergies.   Review of Systems Review of Systems  Respiratory:  Positive for cough and shortness of breath.      Physical Exam Triage Vital Signs ED Triage Vitals  Enc Vitals Group     BP 04/08/22 1822 (!) 156/98     Pulse Rate 04/08/22 1822 (!) 105     Resp 04/08/22 1822 20     Temp 04/08/22 1822 98.7 F (37.1 C)     Temp Source 04/08/22 1822 Oral     SpO2 04/08/22 1822 100 %     Weight --      Height --      Head Circumference --      Peak Flow --      Pain Score 04/08/22 1823 5     Pain Loc --      Pain Edu? --      Excl. in [place]? --    No data found.  Updated Vital Signs BP (!) 156/98 (BP Location: Right Arm)   Pulse (!) 105   Temp 98.7 F (37.1 C) (Oral)   Resp  20   LMP 03/16/2022 (Approximate)   SpO2 100%   Breastfeeding No   Visual Acuity Right Eye Distance:   Left Eye Distance:   Bilateral Distance:    Right Eye Near:   Left Eye Near:    Bilateral Near:     Physical Exam Vitals reviewed.  Constitutional:      Appearance: She is well-developed.  Cardiovascular:     Rate and Rhythm: Normal rate and regular rhythm.     Pulses: Normal pulses.     Heart sounds: Normal heart sounds.  Pulmonary:     Effort: Pulmonary effort is normal.     Breath sounds: Normal breath sounds.  Skin:    General: Skin is warm and dry.  Neurological:     General: No focal deficit present.     Mental Status: She is alert and oriented to person, place, and time.  Psychiatric:        Mood and Affect: Mood normal.        Behavior: Behavior normal.      UC Treatments / Results  Labs (all labs ordered are listed, but only abnormal results are displayed) Labs Reviewed - No data to display  EKG   Radiology No results found.  Procedures Procedures (including critical care time)  Medications Ordered in UC Medications - No data to display  Initial Impression / Assessment and Plan / UC Course  I have reviewed the triage vital signs and the nursing notes.  Pertinent labs & imaging results that were available during my care of the patient were reviewed by me and considered in my medical decision making (see chart for details).   Patient is afebrile here without recent antipyretics. Satting well on room air. Overall is well appearing, well hydrated, without respiratory distress. Pulmonary exam is remarkable for harsh cough.  Lungs CTAB without wheezing, rhonchi, rales.  Patient's symptoms are consistent with acute bronchitis secondary to past viral URI.  Given her history of asthma, concern for poor outcome and will prescribe antibiotic therapy as well.  Lastly discussed with the patient and agreed to prescribe anti-inflammatory therapy due to likely  reactive airway.  Final Clinical Impressions(s) /  UC Diagnoses   Final diagnoses:  None   Discharge Instructions   None    ED Prescriptions   None    PDMP not reviewed this encounter.   Rose Phi, [place] 04/08/22 [phone]

## 2022-04-08 NOTE — ED Triage Notes (Signed)
Cough, SOB for past 2 weeks, body aches, ear pain, sore throat from coughing. Taking sudafed, ibuprofen and using inhaler with no relief of symptoms.

## 2022-04-08 NOTE — Discharge Instructions (Addendum)
Follow up here or with your primary care provider if your symptoms are worsening or not improving with treatment.     

## 2022-05-14 NOTE — Progress Notes (Signed)
GYNECOLOGY ANNUAL PHYSICAL EXAM PROGRESS NOTE  Subjective:    Ann Deleon is a 27 y.o. G71P1001 female who presents for an annual exam. The patient is sexually active. The patient participates in regular exercise: no. Has the patient ever been transfused or tattooed?: yes (professional tattoos). The patient reports that there is not domestic violence in her life.   The patient has the following complaints today:  She does reports that since the birth of her child ~ 6 months ago, her cycles have become much heavier and a little more irregular. Does note that they are less painful though. Also still notes some pain and tenderness at her C-section site.  Reports vaginal discharge for the past several days. Discharge is white and thick. Has a mild odor.   Menstrual History: Menarche age: 64 Patient's last menstrual period was 04/27/2022 (exact date). Period Pattern: (!) Irregular Menstrual Flow: Heavy Menstrual Control: Tampon Menstrual Control Change Freq (Hours): 32-70min Dysmenorrhea: None Cycle Length: 7 days  Gynecologic History:  Contraception: condoms History of STI's:  Last Pap: 12/17/21. Results were: abnormal: Low grade squamous intraepithelial lesion .  Denies prior history of abnormal pap smears.  Last mammogram: n/a. Results were: n/a     Upstream - 05/19/22 1316       Pregnancy Intention Screening   Does the patient want to become pregnant in the next year? No    Does the patient's partner want to become pregnant in the next year? No    Would the patient like to discuss contraceptive options today? No      Contraception Wrap Up   Current Method Female Condom    End Method Female Condom    Contraception Counseling Provided No    How was the end contraceptive method provided? N/A            The pregnancy intention screening data noted above was reviewed. Potential methods of contraception were discussed. The patient elected to proceed with Female  Condom.   OB History  Gravida Para Term Preterm AB Living  1 1 1  0 0 1  SAB IAB Ectopic Multiple Live Births  0 0 0 0 1    # Outcome Date GA Lbr Len/2nd Weight Sex Delivery Anes PTL Lv  1 Term 11/03/21 [redacted]w[redacted]d  6 lb 15.1 oz (3.15 kg) F CS-LTranv Spinal  LIV     Name: Caradonna,GIRL Dealie     Apgar1: 8  Apgar5: 9    Past Medical History:  Diagnosis Date   Anxiety    Asthma    GERD (gastroesophageal reflux disease)     Past Surgical History:  Procedure Laterality Date   CESAREAN SECTION N/A 11/03/2021   Procedure: CESAREAN SECTION;  Surgeon: Rubie Maid, MD;  Location: [facility] ORS;  Service: Obstetrics;  Laterality: N/A;   MANDIBLE FRACTURE SURGERY     MANDIBLE SURGERY      Family History  Problem Relation Age of Onset   Hypertension Mother    Depression Mother    Anxiety disorder Mother    Ovarian cysts Mother    Fibroids Mother    Hypertension Father    Diabetes Father    Nephrolithiasis Father    Depression Father    Breast cancer Neg Hx    Ovarian cancer Neg Hx    Colon cancer Neg Hx     Social History   Socioeconomic History   Marital status: Married    Spouse name: Not on file   Number of  children: Not on file   Years of education: Not on file   Highest education level: Not on file  Occupational History   Not on file  Tobacco Use   Smoking status: Never   Smokeless tobacco: Never  Vaping Use   Vaping Use: Never used  Substance and Sexual Activity   Alcohol use: Not Currently   Drug use: No   Sexual activity: Yes    Birth control/protection: Condom  Other Topics Concern   Not on file  Social History Narrative   Not on file   Social Determinants of Health   Financial Resource Strain: Not on file  Food Insecurity: No Food Insecurity (11/03/2021)   Hunger Vital Sign    Worried About Running Out of Food in the Last Year: Never true    Ran Out of Food in the Last Year: Never true  Transportation Needs: No Transportation Needs (11/03/2021)   PRAPARE  - Hydrologist (Medical): No    Lack of Transportation (Non-Medical): No  Physical Activity: Not on file  Stress: Not on file  Social Connections: Not on file  Intimate Partner Violence: Not on file    Current Outpatient Medications on File Prior to Visit  Medication Sig Dispense Refill   albuterol (PROVENTIL HFA;VENTOLIN HFA) 108 (90 BASE) MCG/ACT inhaler Inhale 2 puffs into the lungs 4 (four) times daily as needed.     albuterol (PROVENTIL) (2.5 MG/3ML) 0.083% nebulizer solution Take 3 mLs (2.5 mg total) by nebulization every 6 (six) hours as needed for wheezing or shortness of breath. 150 mL 2   azithromycin (ZITHROMAX Z-PAK) 250 MG tablet Take 2 tablets (500 mg) today, then 1 tablet (250 mg) for next 4 days. 6 tablet 0   benzonatate (TESSALON) 100 MG capsule Take 1-2 tablets 3 times a day as needed for cough 30 capsule 0   hydrOXYzine (ATARAX) 25 MG tablet Take 1 tablet (25 mg total) by mouth 3 (three) times daily as needed. 30 tablet 1   Spacer/Aero-Holding Chambers (AEROCHAMBER PLUS WITH MASK) inhaler Use as instructed 1 each 1   sulfamethoxazole-trimethoprim (BACTRIM DS) 800-160 MG tablet Take 1 tablet by mouth 2 (two) times daily. 14 tablet 1   No current facility-administered medications on file prior to visit.    No Known Allergies   Review of Systems Constitutional: negative for chills, fatigue, fevers and sweats Eyes: negative for irritation, redness and visual disturbance Ears, nose, mouth, throat, and face: negative for hearing loss, nasal congestion, snoring and tinnitus Respiratory: negative for asthma, cough, sputum Cardiovascular: negative for chest pain, dyspnea, exertional chest pressure/discomfort, irregular heart beat, palpitations and syncope Gastrointestinal: negative for abdominal pain, change in bowel habits, nausea and vomiting Genitourinary: positive for heavy menstrual periods and vaginal discharge (see HPI).  Negative for  genital lesions, sexual problems, dysuria and urinary incontinence Integument/breast: negative for breast lump, breast tenderness and nipple discharge Hematologic/lymphatic: negative for bleeding and easy bruising Musculoskeletal:negative for back pain and muscle weakness Neurological: negative for dizziness, headaches, vertigo and weakness Endocrine: negative for diabetic symptoms including polydipsia, polyuria and skin dryness Allergic/Immunologic: negative for hay fever and urticaria      Objective:  Blood pressure 134/78, height 5\' 8"  (1.727 m), weight 244 lb 12.8 oz (111 kg), last menstrual period 04/27/2022, not currently breastfeeding. Body mass index is 37.22 kg/m.  General Appearance:    Alert, cooperative, no distress, appears stated age, moderate obesity  Head:    Normocephalic, without obvious abnormality, atraumatic  Eyes:    PERRL, conjunctiva/corneas clear, EOM's intact, both eyes  Ears:    Normal external ear canals, both ears  Nose:   Nares normal, septum midline, mucosa normal, no drainage or sinus tenderness  Throat:   Lips, mucosa, and tongue normal; teeth and gums normal  Neck:   Supple, symmetrical, trachea midline, no adenopathy; thyroid: no enlargement/tenderness/nodules; no carotid bruit or JVD  Back:     Symmetric, no curvature, ROM normal, no CVA tenderness  Lungs:     Clear to auscultation bilaterally, respirations unlabored  Chest Wall:    No tenderness or deformity   Heart:    Regular rate and rhythm, S1 and S2 normal, no murmur, rub or gallop  Breast Exam:    No tenderness, masses, or nipple abnormality  Abdomen:     Soft, non-tender, bowel sounds active all four quadrants, no masses, no organomegaly.    Genitalia:    Pelvic:external genitalia normal, vagina without lesions, or tenderness, scant thin white discharge present. Rectovaginal septum normal. Cervix normal in appearance, no cervical motion tenderness, no adnexal masses or tenderness.  Uterus normal  size, shape, mobile, regular contours, nontender.  Rectal:    Normal external sphincter.  No hemorrhoids appreciated. Internal exam not done.   Extremities:   Extremities normal, atraumatic, no cyanosis or edema  Pulses:   2+ and symmetric all extremities  Skin:   Skin color, texture, turgor normal, no rashes or lesions  Lymph nodes:   Cervical, supraclavicular, and axillary nodes normal  Neurologic:   CNII-XII intact, normal strength, sensation and reflexes throughout     Labs:  Lab Results  Component Value Date   WBC 14.5 (H) 11/04/2021   HGB 11.5 12/17/2021   HCT 36.0 12/17/2021   MCV 82.5 11/04/2021   PLT 260 11/04/2021    Lab Results  Component Value Date   CREATININE 0.66 10/22/2021   BUN 7 10/22/2021   NA 139 10/22/2021   K 4.2 10/22/2021   CL 103 10/22/2021   CO2 17 (L) 10/22/2021    Lab Results  Component Value Date   ALT 18 10/22/2021   AST 16 10/22/2021   ALKPHOS 232 (H) 10/22/2021   BILITOT 0.3 10/22/2021    Lab Results  Component Value Date   TSH 1.900 04/18/2021     Assessment:   1. Encounter for well woman exam with routine gynecological exam   2. LGSIL on Pap smear of cervix   3. Vaginal discharge   4. Anxiety and depression      Plan:  - Blood tests: CBC with diff and Comprehensive metabolic panel. - Breast self exam technique reviewed and patient encouraged to perform self-exam monthly. - Contraception: condoms. - Discussed healthy lifestyle modifications. - Pap smear discussed and ordered. - Vaginal culture collected for vaginal discharge, included STD panel on pap.  - COVID vaccination status: Has completed 2 doses of Pfizer. Eligible for booster.  - History of anxiety and depression, no depression symptoms currently. Anxiety managed with Atarax.  - Heavy menses, discussed management options. Patient desires something non-hormonal. Will give 3 month trial of TXA.  If this works well, can refill for the year. Notes cycles are irregular  but on further discussion notes that cycles shifts a few days each month. Discussed that this was overall normal.  Follow up in 1 year for annual exam   Rubie Maid, MD [facility]

## 2022-05-14 NOTE — Patient Instructions (Signed)

## 2022-05-19 ENCOUNTER — Ambulatory Visit (INDEPENDENT_AMBULATORY_CARE_PROVIDER_SITE_OTHER): Payer: Medicaid Other | Admitting: Obstetrics and Gynecology

## 2022-05-19 ENCOUNTER — Other Ambulatory Visit (HOSPITAL_COMMUNITY)
Admission: RE | Admit: 2022-05-19 | Discharge: 2022-05-19 | Disposition: A | Discharge status: Home / Self Care | Payer: Medicaid Other | Source: Ambulatory Visit | Attending: Obstetrics and Gynecology | Admitting: Obstetrics and Gynecology

## 2022-05-19 ENCOUNTER — Encounter: Payer: Self-pay | Admitting: Obstetrics and Gynecology

## 2022-05-19 VITALS — BP 134/78 | Ht 68.0 in | Wt 244.8 lb

## 2022-05-19 DIAGNOSIS — Z124 Encounter for screening for malignant neoplasm of cervix: Secondary | ICD-10-CM

## 2022-05-19 DIAGNOSIS — N898 Other specified noninflammatory disorders of vagina: Secondary | ICD-10-CM

## 2022-05-19 DIAGNOSIS — Z01419 Encounter for gynecological examination (general) (routine) without abnormal findings: Secondary | ICD-10-CM | POA: Diagnosis not present

## 2022-05-19 DIAGNOSIS — F419 Anxiety disorder, unspecified: Secondary | ICD-10-CM

## 2022-05-19 DIAGNOSIS — N920 Excessive and frequent menstruation with regular cycle: Secondary | ICD-10-CM

## 2022-05-19 DIAGNOSIS — R87612 Low grade squamous intraepithelial lesion on cytologic smear of cervix (LGSIL): Secondary | ICD-10-CM

## 2022-05-19 MED ORDER — TRANEXAMIC ACID 650 MG PO TABS: 1300.0000 mg | ORAL_TABLET | Freq: Three times a day (TID) | ORAL | 2 refills | Status: DC

## 2022-05-21 LAB — CERVICOVAGINAL ANCILLARY ONLY
Bacterial Vaginitis (gardnerella): NEGATIVE
Candida Glabrata: NEGATIVE
Candida Vaginitis: NEGATIVE
Comment: NEGATIVE
Comment: NEGATIVE
Comment: NEGATIVE

## 2022-05-22 LAB — CYTOLOGY - PAP

## 2022-08-05 ENCOUNTER — Ambulatory Visit (INDEPENDENT_AMBULATORY_CARE_PROVIDER_SITE_OTHER): Payer: Medicaid Other

## 2022-08-05 ENCOUNTER — Other Ambulatory Visit (HOSPITAL_COMMUNITY)
Admission: RE | Admit: 2022-08-05 | Discharge: 2022-08-05 | Disposition: A | Discharge status: Home / Self Care | Payer: Medicaid Other | Source: Ambulatory Visit | Attending: Obstetrics and Gynecology | Admitting: Obstetrics and Gynecology

## 2022-08-05 VITALS — BP 120/80 | Ht 68.0 in | Wt 248.0 lb

## 2022-08-05 DIAGNOSIS — N760 Acute vaginitis: Secondary | ICD-10-CM

## 2022-08-05 NOTE — Progress Notes (Signed)
    NURSE VISIT NOTE  Subjective:    Patient ID: Ann Deleon, female    DOB: 12-11-1995, 27 y.o.   MRN: [id number]  HPI  Patient is a 27 y.o. G44P1001 female who presents for white vaginal discharge with odor fishy/sour smell and itching for 4 day(s). Denies abnormal vaginal bleeding or significant pelvic pain or fever. denies dysuria, hematuria, urinary frequency, and urinary urgency. Patient does not history of known exposure to STD.   Objective:    BP 120/80   Ht 5\' 8"  (1.727 m)   Wt 248 lb (112.5 kg)   LMP 07/18/2022   BMI 37.71 kg/m    @THIS  VISIT ONLY@  Assessment:     Plan:   GC and chlamydia DNA  probe sent to lab. Treatment: abstain from coitus during course of treatment ROV prn if symptoms persist or worsen.   Cornelius Moras, CMA

## 2022-08-05 NOTE — Patient Instructions (Signed)

## 2022-08-06 ENCOUNTER — Other Ambulatory Visit: Payer: Self-pay

## 2022-08-06 DIAGNOSIS — B9689 Other specified bacterial agents as the cause of diseases classified elsewhere: Secondary | ICD-10-CM

## 2022-08-06 DIAGNOSIS — B3731 Acute candidiasis of vulva and vagina: Secondary | ICD-10-CM

## 2022-08-06 LAB — CERVICOVAGINAL ANCILLARY ONLY
Bacterial Vaginitis (gardnerella): POSITIVE — AB
Candida Glabrata: NEGATIVE
Candida Vaginitis: POSITIVE — AB
Chlamydia: NEGATIVE
Comment: NEGATIVE
Comment: NEGATIVE
Comment: NEGATIVE
Comment: NEGATIVE
Comment: NEGATIVE
Comment: NORMAL
Neisseria Gonorrhea: NEGATIVE
Trichomonas: NEGATIVE

## 2022-08-06 MED ORDER — FLUCONAZOLE 150 MG PO TABS: 150.0000 mg | ORAL_TABLET | Freq: Once | ORAL | 0 refills | Status: AC

## 2022-08-06 MED ORDER — METRONIDAZOLE 500 MG PO TABS: 500.0000 mg | ORAL_TABLET | Freq: Two times a day (BID) | ORAL | 0 refills | Status: DC

## 2022-08-06 NOTE — Addendum Note (Signed)
Addended by: Fabian November on: 08/06/2022 10:27 PM   Modules accepted: Orders

## 2022-08-17 ENCOUNTER — Encounter: Payer: Self-pay | Admitting: Obstetrics and Gynecology

## 2022-09-28 ENCOUNTER — Encounter: Payer: Self-pay | Admitting: Obstetrics and Gynecology

## 2022-10-05 ENCOUNTER — Other Ambulatory Visit (HOSPITAL_COMMUNITY)
Admission: RE | Admit: 2022-10-05 | Discharge: 2022-10-05 | Disposition: A | Discharge status: Home / Self Care | Payer: Medicaid Other | Source: Ambulatory Visit | Attending: Obstetrics and Gynecology | Admitting: Obstetrics and Gynecology

## 2022-10-05 ENCOUNTER — Ambulatory Visit (INDEPENDENT_AMBULATORY_CARE_PROVIDER_SITE_OTHER): Payer: Medicaid Other

## 2022-10-05 VITALS — BP 142/112 | HR 69 | Ht 68.0 in | Wt 247.6 lb

## 2022-10-05 DIAGNOSIS — N898 Other specified noninflammatory disorders of vagina: Secondary | ICD-10-CM | POA: Diagnosis not present

## 2022-10-05 DIAGNOSIS — R350 Frequency of micturition: Secondary | ICD-10-CM

## 2022-10-05 DIAGNOSIS — R399 Unspecified symptoms and signs involving the genitourinary system: Secondary | ICD-10-CM

## 2022-10-05 LAB — POCT URINALYSIS DIPSTICK
Bilirubin, UA: NEGATIVE
Glucose, UA: NEGATIVE
Ketones, UA: NEGATIVE
Nitrite, UA: NEGATIVE
Protein, UA: NEGATIVE
Spec Grav, UA: 1.025 (ref 1.010–1.025)
Urobilinogen, UA: 0.2 E.U./dL
pH, UA: 5 (ref 5.0–8.0)

## 2022-10-05 MED ORDER — NITROFURANTOIN MONOHYD MACRO 100 MG PO CAPS
100.0000 mg | ORAL_CAPSULE | Freq: Two times a day (BID) | ORAL | 0 refills | Status: DC
Start: 2022-10-05 — End: 2022-10-23

## 2022-10-05 NOTE — Progress Notes (Signed)
    NURSE VISIT NOTE  Subjective:    Patient ID: Delos Haring, female    DOB: September 04, 1995, 27 y.o.   MRN: [id number]  HPI  Patient is a 27 y.o. G30P1001 female who presents for mucous like vaginal discharge for 1 week(s). Denies abnormal vaginal bleeding or significant pelvic pain or fever. admits to urinary frequency and flank pain. Patient denies history of known exposure to STD.   Objective:    BP (!) 142/112   Pulse 69   Ht 5\' 8"  (1.727 m)   Wt 247 lb 9.6 oz (112.3 kg)   BMI 37.65 kg/m     Assessment:   1. UTI symptoms   2. Urinary frequency   3. Vaginal discharge       Plan:   GC and chlamydia DNA  probe sent to lab. Treatment: Macrobid bid 7 days. Await for results for further treatment, increase water intake and use tylenol as needed.  ROV prn if symptoms persist or worsen.   Loman Chroman, CMA

## 2022-10-06 LAB — CERVICOVAGINAL ANCILLARY ONLY
Bacterial Vaginitis (gardnerella): POSITIVE — AB
Candida Glabrata: NEGATIVE
Candida Vaginitis: POSITIVE — AB
Comment: NEGATIVE
Comment: NEGATIVE
Comment: NEGATIVE

## 2022-10-07 ENCOUNTER — Other Ambulatory Visit: Payer: Self-pay | Admitting: Obstetrics and Gynecology

## 2022-10-07 LAB — URINE CULTURE

## 2022-10-07 MED ORDER — FLUCONAZOLE 150 MG PO TABS: 150.0000 mg | ORAL_TABLET | Freq: Once | ORAL | 1 refills | Status: AC

## 2022-10-23 ENCOUNTER — Ambulatory Visit
Admission: EM | Admit: 2022-10-23 | Discharge: 2022-10-23 | Disposition: A | Discharge status: Home / Self Care | Payer: Medicaid Other | Attending: Internal Medicine | Admitting: Internal Medicine

## 2022-10-23 DIAGNOSIS — Z1152 Encounter for screening for COVID-19: Secondary | ICD-10-CM | POA: Insufficient documentation

## 2022-10-23 DIAGNOSIS — R350 Frequency of micturition: Secondary | ICD-10-CM

## 2022-10-23 DIAGNOSIS — R051 Acute cough: Secondary | ICD-10-CM | POA: Diagnosis not present

## 2022-10-23 DIAGNOSIS — R110 Nausea: Secondary | ICD-10-CM

## 2022-10-23 DIAGNOSIS — J208 Acute bronchitis due to other specified organisms: Secondary | ICD-10-CM | POA: Diagnosis not present

## 2022-10-23 DIAGNOSIS — J4521 Mild intermittent asthma with (acute) exacerbation: Secondary | ICD-10-CM

## 2022-10-23 LAB — POCT INFLUENZA A/B
Influenza A, POC: NEGATIVE
Influenza B, POC: NEGATIVE

## 2022-10-23 LAB — POCT URINALYSIS DIP (MANUAL ENTRY)
Bilirubin, UA: NEGATIVE
Glucose, UA: NEGATIVE mg/dL
Leukocytes, UA: NEGATIVE
Nitrite, UA: NEGATIVE
Protein Ur, POC: 100 mg/dL — AB
Spec Grav, UA: 1.015 (ref 1.010–1.025)
Urobilinogen, UA: 0.2 U/dL
pH, UA: 5.5 (ref 5.0–8.0)

## 2022-10-23 LAB — POCT URINE PREGNANCY: Preg Test, Ur: NEGATIVE

## 2022-10-23 MED ORDER — SULFAMETHOXAZOLE-TRIMETHOPRIM 800-160 MG PO TABS
1.0000 | ORAL_TABLET | Freq: Two times a day (BID) | ORAL | 0 refills | Status: AC
Start: 2022-10-23 — End: 2022-10-30

## 2022-10-23 MED ORDER — ONDANSETRON 4 MG PO TBDP
4.0000 mg | ORAL_TABLET | Freq: Once | ORAL | Status: AC
  Administered 2022-10-23: 4 mg via ORAL

## 2022-10-23 MED ORDER — ALBUTEROL SULFATE HFA 108 (90 BASE) MCG/ACT IN AERS
1.0000 | INHALATION_SPRAY | Freq: Four times a day (QID) | RESPIRATORY_TRACT | 0 refills | Status: AC | PRN
Start: 2022-10-23 — End: ?

## 2022-10-23 MED ORDER — PREDNISONE 20 MG PO TABS: 40.0000 mg | ORAL_TABLET | Freq: Every day | ORAL | 0 refills | Status: AC

## 2022-10-23 MED ORDER — PROMETHAZINE-DM 6.25-15 MG/5ML PO SYRP
5.0000 mL | ORAL_SOLUTION | Freq: Four times a day (QID) | ORAL | 0 refills | Status: AC | PRN
Start: 2022-10-23 — End: ?

## 2022-10-23 NOTE — Discharge Instructions (Addendum)
You may use your albuterol inhaler as needed for wheezing or shortness of breath.  Promethazine DM as needed for cough.  Please note this medication make you drowsy.  Do not drink alcohol or drive on this medication.  Prednisone daily for 5 days.  Lots of rest and fluids.  Continue Tylenol or ibuprofen as needed for fever.  Bactrim as brisk for your UTI symptoms.  Please follow-up with your PCP in 2 days for recheck.  Please go to the ER for any worsening symptoms.  Hope you feel better soon!

## 2022-10-23 NOTE — ED Triage Notes (Signed)
Pt states 2 weeks ago she had a uti and was treated with antibiotics. Then Wednesday morning started having fever 100.7, vomiting, lower back pain, chills, body aches, urinary frequency, fatigue, nausea and cough. Took a home covid test yesterday and it was negative.

## 2022-10-23 NOTE — ED Provider Notes (Signed)
Renaldo Fiddler    CSN: [id number] Arrival date & time: 10/23/22  1232      History   Chief Complaint Chief Complaint  Patient presents with   Generalized Body Aches   Emesis    HPI Ann Deleon is a 27 y.o. female  presents for evaluation of URI symptoms for 3 days. Patient reports associated symptoms of cough, congestion, urinary frequency, body aches, shortness of breath, fever, vomiting and diarrhea. Denies sore throat, ear pain patient does have a hx of asthma, smoking.  Does not have an albuterol inhaler.  She reports she was treated for UTI 2 weeks ago with Macrobid with resolution of symptoms.  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  She reports a negative home COVID test yesterday.  Pt has taken ibuprofen OTC for symptoms. Pt has no other concerns at this time.    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   Allergic rhinitis due to pollen 04/12/2015   Asthma, mild persistent 07/06/2014   Dermatitis, eczematoid 07/06/2014   Anxiety 07/06/2014    Past Surgical History:  Procedure Laterality Date   CESAREAN SECTION N/A 11/03/2021   Procedure: CESAREAN SECTION;  Surgeon: Hildred Laser, MD;  Location: [facility] ORS;  Service: Obstetrics;  Laterality: N/A;   MANDIBLE FRACTURE SURGERY     MANDIBLE SURGERY      OB History     Gravida  1   Para  1   Term  1   Preterm  0   AB  0   Living  1      SAB  0   IAB  0   Ectopic  0   Multiple  0   Live Births  1             Home Medications    Prior to Admission medications   Medication Sig Start Date End Date Taking? Authorizing Provider  albuterol (VENTOLIN HFA) 108 (90 Base) MCG/ACT inhaler Inhale 1-2 puffs into the lungs every 6 (six) hours as needed for wheezing or shortness of breath. 10/23/22  Yes Radford Pax, NP  fluconazole (DIFLUCAN) 150 MG tablet Take 150 mg by mouth once. 10/07/22  Yes [provider]  predniSONE (DELTASONE) 20 MG tablet Take 2 tablets (40 mg total) by mouth daily with breakfast for 5 days. 10/23/22 10/28/22 Yes Radford Pax, NP  promethazine-dextromethorphan (PROMETHAZINE-DM) 6.25-15 MG/5ML syrup Take 5 mLs by mouth 4 (four) times daily as needed for cough. 10/23/22  Yes Radford Pax, NP  sulfamethoxazole-trimethoprim (BACTRIM DS) 800-160 MG tablet Take 1 tablet by mouth 2 (two) times daily for 7 days. 10/23/22 10/30/22 Yes Radford Pax, NP    Family History Family History  Problem Relation Age of Onset   Hypertension Mother    Depression Mother    Anxiety disorder Mother    Ovarian cysts Mother    Fibroids Mother    Hypertension Father    Diabetes Father    Nephrolithiasis  Father    Depression Father    Breast cancer Neg Hx    Ovarian cancer Neg Hx    Colon cancer Neg Hx     Social History Social History   Tobacco Use   Smoking status: Never   Smokeless tobacco: Never  Vaping Use   Vaping status: Never Used  Substance Use Topics   Alcohol use: Not Currently   Drug use: No     Allergies   Patient has no known allergies.   Review of Systems Review of Systems  Constitutional:  Positive for fever.  HENT:  Positive for congestion.   Respiratory:  Positive for cough.   Gastrointestinal:  Positive for diarrhea, nausea and vomiting.  Musculoskeletal:  Positive for myalgias.     Physical Exam Triage Vital Signs ED Triage Vitals [10/23/22 1302]  Encounter Vitals Group     BP (!) 147/84     Systolic BP Percentile      Diastolic BP Percentile       Pulse Rate (!) 124     Resp 16     Temp 99.7 F (37.6 C)     Temp Source Oral     SpO2 98 %     Weight      Height      Head Circumference      Peak Flow      Pain Score 6     Pain Loc      Pain Education      Exclude from Growth Chart    No data found.  Updated Vital Signs BP (!) 147/84 (BP Location: Left Arm)   Pulse (!) 116   Temp 99.7 F (37.6 C) (Oral)   Resp 16   LMP 10/08/2022 (Exact Date)   SpO2 98%   Breastfeeding No   Visual Acuity Right Eye Distance:   Left Eye Distance:   Bilateral Distance:    Right Eye Near:   Left Eye Near:    Bilateral Near:     Physical Exam Vitals and nursing note reviewed.  Constitutional:      General: She is not in acute distress.    Appearance: She is well-developed. She is not ill-appearing.  HENT:     Head: Normocephalic and atraumatic.     Right Ear: Tympanic membrane and ear canal normal.     Left Ear: Tympanic membrane and ear canal normal.     Nose: Congestion present.     Mouth/Throat:     Mouth: Mucous membranes are moist.     Pharynx: Oropharynx is clear. Uvula midline. No oropharyngeal exudate or posterior oropharyngeal erythema.     Tonsils: No tonsillar exudate or tonsillar abscesses.  Eyes:     Conjunctiva/sclera: Conjunctivae normal.     Pupils: Pupils are equal, round, and reactive to light.  Cardiovascular:     Rate and Rhythm: Regular rhythm. Tachycardia present.     Heart sounds: Normal heart sounds.     Comments: Tachycardia 116 in setting of low-grade fever Pulmonary:     Effort: Pulmonary effort is normal.     Breath sounds: Normal breath sounds.  Musculoskeletal:     Cervical back: Normal range of motion and neck supple.  Lymphadenopathy:     Cervical: No cervical adenopathy.  Skin:    General: Skin is warm and dry.  Neurological:     General: No focal deficit present.     Mental Status: She is alert and oriented to person, place, and time.  Psychiatric:        Mood and Affect: Mood  normal.        Behavior: Behavior normal.      UC Treatments / Results  Labs (all labs ordered are listed, but only abnormal results are displayed) Labs Reviewed  POCT URINALYSIS DIP (MANUAL ENTRY) - Abnormal; Notable for the following components:      Result Value   Clarity, UA turbid (*)    Ketones, POC UA moderate (40) (*)    Blood, UA moderate (*)    Protein Ur, POC =100 (*)    All other components within normal limits  SARS CORONAVIRUS 2 (TAT 6-24 HRS)  URINE CULTURE  POCT URINE PREGNANCY  POCT INFLUENZA A/B    EKG   Radiology No results found.  Procedures Procedures (including critical care time)  Medications Ordered in UC Medications  ondansetron (ZOFRAN-ODT) disintegrating tablet 4 mg (4 mg Oral Given 10/23/22 1351)    Initial Impression / Assessment and Plan / UC Course  I have reviewed the triage vital signs and the nursing notes.  Pertinent labs & imaging results that were available during my care of the patient were reviewed by me and considered in my medical decision making (see chart for details).     Reviewed exam and symptoms with patient.  No red flags.  UA with blood otherwise negative for UTI.  Will culture.  Patient concerned as symptoms are the same as when she had a UTI before and she does not want it to progress.  Will start Bactrim will awaiting culture.  Negative rapid flu.  COVID PCR and will contact if positive.  Patient reports she felt nauseous ongoing over results.  She was given 4 of ODT Zofran and some ginger ale with significant improvement in symptoms.  Discussed viral upper respiratory illness.  Albuterol inhaler as needed for asthma symptoms.  Start prednisone daily for 5 days.  Promethazine DM as needed for cough.  Side effect profile reviewed.  Advised patient to follow-up with PCP 2 days for recheck.  Strict ER precautions reviewed and patient verbalized understanding. Final Clinical Impressions(s) / UC Diagnoses   Final diagnoses:   Acute cough  Urinary frequency  Nausea  Mild intermittent asthma with acute exacerbation  Viral bronchitis     Discharge Instructions      You may use your albuterol inhaler as needed for wheezing or shortness of breath.  Promethazine DM as needed for cough.  Please note this medication make you drowsy.  Do not drink alcohol or drive on this medication.  Prednisone daily for 5 days.  Lots of rest and fluids.  Continue Tylenol or ibuprofen as needed for fever.  Bactrim as brisk for your UTI symptoms.  Please follow-up with your PCP in 2 days for recheck.  Please go to the ER for any worsening symptoms.  Hope you feel better soon!     ED Prescriptions     Medication Sig Dispense Auth. Provider   albuterol (VENTOLIN HFA) 108 (90 Base) MCG/ACT inhaler Inhale 1-2 puffs into the lungs every 6 (six) hours as needed for wheezing or shortness of breath. 1 each Radford Pax, NP   promethazine-dextromethorphan (PROMETHAZINE-DM) 6.25-15 MG/5ML syrup Take 5 mLs by mouth 4 (four) times daily as needed for cough. 118 mL Radford Pax, NP   predniSONE (DELTASONE) 20 MG tablet Take 2 tablets (40 mg total) by mouth daily with breakfast for 5 days. 10 tablet Radford Pax, NP  sulfamethoxazole-trimethoprim (BACTRIM DS) 800-160 MG tablet Take 1 tablet by mouth 2 (two) times daily for 7 days. 14 tablet Radford Pax, NP      PDMP not reviewed this encounter.   Radford Pax, NP 10/23/22 [phone]

## 2022-10-24 LAB — SARS CORONAVIRUS 2 (TAT 6-24 HRS): SARS Coronavirus 2: NEGATIVE

## 2022-10-24 LAB — URINE CULTURE: Culture: NO GROWTH

## 2022-11-18 ENCOUNTER — Ambulatory Visit (INDEPENDENT_AMBULATORY_CARE_PROVIDER_SITE_OTHER): Payer: Medicaid Other | Admitting: Psychiatry

## 2022-11-18 ENCOUNTER — Encounter: Payer: Self-pay | Admitting: Psychiatry

## 2022-11-18 VITALS — BP 118/80 | HR 65 | Temp 97.5°F | Ht 68.0 in | Wt 242.8 lb

## 2022-11-18 DIAGNOSIS — F439 Reaction to severe stress, unspecified: Secondary | ICD-10-CM

## 2022-11-18 DIAGNOSIS — Z79899 Other long term (current) drug therapy: Secondary | ICD-10-CM

## 2022-11-18 DIAGNOSIS — F411 Generalized anxiety disorder: Secondary | ICD-10-CM

## 2022-11-18 DIAGNOSIS — Z8659 Personal history of other mental and behavioral disorders: Secondary | ICD-10-CM | POA: Diagnosis not present

## 2022-11-18 DIAGNOSIS — F4310 Post-traumatic stress disorder, unspecified: Secondary | ICD-10-CM | POA: Insufficient documentation

## 2022-11-18 MED ORDER — VENLAFAXINE HCL ER 37.5 MG PO CP24: 37.5000 mg | ORAL_CAPSULE | Freq: Every day | ORAL | 1 refills | Status: DC

## 2022-11-18 MED ORDER — HYDROXYZINE HCL 10 MG PO TABS: 10.0000 mg | ORAL_TABLET | Freq: Every evening | ORAL | 0 refills | Status: DC | PRN
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
## 2022-11-18 NOTE — Patient Instructions (Addendum)
[email or web address]  [email or web address]  [email or web address] [facility] [address], [place], [place] [address]   [phone]  [facility] [email or web address] [address] [place], [place] [address]  [phone]  [facility], [facility] [email or web address] [address], [place], [place] [address]  [phone]   [facility] - [phone]  [facility] - [phone]  [facility] - [phone] counseling [phone]  [facility] [phone]   [email or web address] this clinician can offer telehealth and has a sliding scale option  [email or web address] this group also offers sliding scale rates and is based out of [place]  Dr. Liborio Nixon with the [facility] specializes in divorce  [employer] [facility] has interns who offer sliding scale rates and some of the full time clinicians do, as well. You complete their contact form on their website and the referrals coordinator will help to get connected to someone   Medicaid below :  [facility] [address] [place], [place] [address]  [phone]    [employer] [address] [place], [place] [address]  [phone]    [facility] [address] [place], [place] [address]  [phone]    Venlafaxine Extended-Release Capsules What is this medication? VENLAFAXINE (VEN la fax een) treats depression and anxiety. It increases the amount of serotonin and norepinephrine in the brain, hormones that help regulate mood. It belongs to a group of medications called SNRIs. This medicine may be used for other purposes; ask your health care provider or pharmacist if you have questions. COMMON  BRAND NAME(S): Effexor XR What should I tell my care team before I take this medication? They need to know if you have any of these conditions: Bleeding disorders Glaucoma Heart disease High blood pressure High cholesterol Kidney disease Liver disease Low levels of sodium in the blood Mania or bipolar disorder Seizures Suicidal thoughts, plans, or attempt by you or a family member Take medications that treat or prevent blood clots Thyroid disease An unusual or allergic reaction to venlafaxine, other medications, foods, dyes, or preservatives Pregnant or trying to get pregnant Breastfeeding How should I use this medication? Take this medication by mouth with a full glass of water. Take it as directed on the prescription label. Do not cut, crush, or chew this medication. Take it with food. You may open the capsule and put the contents in 1 teaspoon of applesauce. Swallow the medication and applesauce right away. Do not chew the medication or applesauce. Follow with a glass of water to ensure complete swallowing of the pellets. Try to take your medication at about the same time each day. Do not take your medication more often than directed. Keep taking this medication unless your care team tells you to stop. Stopping it too quickly can cause serious side effects. It can also make your condition worse. A special MedGuide will be given to you by the pharmacist with each prescription and refill. Be sure to read this information carefully each time. Talk to your care team about the use of this medication in children. Special care may be needed. Overdosage: If you think you have taken too much of this medicine contact a poison control center or emergency room at once. NOTE: This medicine is only for you. Do not share this medicine with others. What if I miss  a dose? If you miss a dose, take it as soon as you can. If it is almost time for your next dose, take only that dose. Do not take double or  extra doses. What may interact with this medication? Do not take this medication with any of the following: Alcohol Certain medications for fungal infections, such as fluconazole, itraconazole, ketoconazole, posaconazole, voriconazole Cisapride Desvenlafaxine Dronedarone Duloxetine Levomilnacipran Linezolid MAOIs, such as Carbex, Eldepryl, Marplan, Nardil, and Parnate Methylene blue (injected into a vein) Milnacipran Pimozide Thioridazine This medication may also interact with the following: Amphetamines Aspirin and aspirin-like medications Certain medications for mental health conditions Certain medications for migraine headaches, such as almotriptan, eletriptan, frovatriptan, naratriptan, rizatriptan, sumatriptan, zolmitriptan Certain medications for sleep Certain medications that treat or prevent blood clots, such as dalteparin, enoxaparin, warfarin Cimetidine Clozapine Diuretics Fentanyl Furazolidone Indinavir Isoniazid Lithium Metoprolol NSAIDS, medications for pain and inflammation, such as ibuprofen or naproxen Other medications that cause heart rhythm changes Procarbazine Rasagiline Supplements, such as St. John's wort, kava kava, valerian Tramadol Tryptophan This list may not describe all possible interactions. Give your health care provider a list of all the medicines, herbs, non-prescription drugs, or dietary supplements you use. Also tell them if you smoke, drink alcohol, or use illegal drugs. Some items may interact with your medicine. What should I watch for while using this medication? Tell your care team if your symptoms do not get better or if they get worse. Visit your care team for regular checks on your progress. Because it may take several weeks to see the full effects of this medication, it is important to continue your treatment as prescribed by your care team. Watch for new or worsening thoughts of suicide or depression. This includes sudden changes  in mood, behaviors, or thoughts. These changes can happen at any time but are more common in the beginning of treatment or after a change in dose. Call your care team right away if you experience these thoughts or worsening depression. This medication may cause mood and behavior changes, such as anxiety, nervousness, irritability, hostility, restlessness, excitability, hyperactivity, or trouble sleeping. These changes can happen at any time but are more common in the beginning of treatment or after a change in dose. Call your care team right away if you notice any of these symptoms. This medication can cause an increase in blood pressure. Check with your care team for instructions on monitoring your blood pressure while taking this medication. This medication may affect your coordination, reaction time, or judgment. Do not drive or operate machinery until you know how this medication affects you. Sit up or stand slowly to reduce the risk of dizzy or fainting spells. Drinking alcohol with this medication can increase the risk of these side effects. Your mouth may get dry. Chewing sugarless gum or sucking hard candy and drinking plenty of water may help. Contact your care team if the problem does not go away or is severe. What side effects may I notice from receiving this medication? Side effects that you should report to your care team as soon as possible: Allergic reactions--skin rash, itching, hives, swelling of the face, lips, tongue, or throat Bleeding--bloody or black, tar-like stools, red or dark brown urine, vomiting blood or brown material that looks like coffee grounds, small, red or purple spots on skin, unusual bleeding or bruising Heart rhythm changes--fast or irregular heartbeat, dizziness, feeling faint or lightheaded, chest pain, trouble breathing Increase in blood pressure Loss of appetite with weight loss Low  sodium level--muscle weakness, fatigue, dizziness, headache,  confusion Serotonin syndrome--irritability, confusion, fast or irregular heartbeat, muscle stiffness, twitching muscles, sweating, high fever, seizures, chills, vomiting, diarrhea Sudden eye pain or change in vision such as blurry vision, seeing halos around lights, vision loss Thoughts of suicide or self-harm, worsening mood, feelings of depression Side effects that usually do not require medical attention (report to your care team if they continue or are bothersome): Anxiety, nervousness Change in sex drive or performance Dizziness Dry mouth Excessive sweating Nausea Tremors or shaking Trouble sleeping This list may not describe all possible side effects. Call your doctor for medical advice about side effects. You may report side effects to FDA at 1-800-FDA-1088. Where should I keep my medication? Keep out of the reach of children and pets. Store at a controlled temperature between 20 and 25 degrees C (68 degrees and 77 degrees F), in a dry place. Throw away any unused medication after the expiration date. NOTE: This sheet is a summary. It may not cover all possible information. If you have questions about this medicine, talk to your doctor, pharmacist, or health care provider. Hydroxyzine Capsules or Tablets What is this medication? HYDROXYZINE (hye DROX i zeen) treats the symptoms of allergies and allergic reactions. It may also be used to treat anxiety or cause drowsiness before a procedure. It works by blocking histamine, a substance released by the body during an allergic reaction. It belongs to a group of medications called antihistamines. This medicine may be used for other purposes; ask your health care provider or pharmacist if you have questions. COMMON BRAND NAME(S): ANX, Atarax, Rezine, Vistaril What should I tell my care team before I take this medication? They need to know if you have any of these conditions: Glaucoma Heart disease Irregular heartbeat or rhythm Kidney  disease Liver disease Lung or breathing disease, such as asthma Stomach or intestine problems Thyroid disease Trouble passing urine An unusual or allergic reaction to hydroxyzine, other medications, foods, dyes or preservatives Pregnant or trying to get pregnant Breastfeeding How should I use this medication? Take this medication by mouth with a full glass of water. Take it as directed on the prescription label at the same time every day. You can take it with or without food. If it upsets your stomach, take it with food. Talk to your care team about the use of this medication in children. While it may be prescribed for children as young as 6 years for selected conditions, precautions do apply. People 65 years and older may have a stronger reaction and need a smaller dose. Overdosage: If you think you have taken too much of this medicine contact a poison control center or emergency room at once. NOTE: This medicine is only for you. Do not share this medicine with others. What if I miss a dose? If you miss a dose, take it as soon as you can. If it is almost time for your next dose, take only that dose. Do not take double or extra doses. What may interact with this medication? Do not take this medication with any of the following: Cisapride Dronedarone Pimozide Thioridazine This medication may also interact with the following: Alcohol Antihistamines for allergy, cough, and cold Atropine Barbiturate medications for sleep or seizures, such as phenobarbital Certain antibiotics, such as erythromycin or clarithromycin Certain medications for anxiety or sleep Certain medications for bladder problems, such as oxybutynin or tolterodine Certain medications for irregular heartbeat Certain medications for mental health conditions Certain medications for Parkinson  disease, such as benztropine, trihexyphenidyl Certain medications for seizures, such as phenobarbital or primidone Certain medications  for stomach problems, such as dicyclomine or hyoscyamine Certain medications for travel sickness, such as scopolamine Ipratropium Opioid medications for pain Other medications that cause heart rhythm changes, such as dofetilide This list may not describe all possible interactions. Give your health care provider a list of all the medicines, herbs, non-prescription drugs, or dietary supplements you use. Also tell them if you smoke, drink alcohol, or use illegal drugs. Some items may interact with your medicine. What should I watch for while using this medication? Visit your care team for regular checks on your progress. Tell your care team if your symptoms do not start to get better or if they get worse. This medication may affect your coordination, reaction time, or judgment. Do not drive or operate machinery until you know how this medication affects you. Sit up or stand slowly to reduce the risk of dizzy or fainting spells. Drinking alcohol with this medication can increase the risk of these side effects. Your mouth may get dry. Chewing sugarless gum or sucking hard candy and drinking plenty of water may help. Contact your care team if the problem does not go away or is severe. This medication may cause dry eyes and blurred vision. If you wear contact lenses, you may feel some discomfort. Lubricating eye drops may help. See your care team if the problem does not go away or is severe. If you are receiving skin tests for allergies, tell your care team you are taking this medication. What side effects may I notice from receiving this medication? Side effects that you should report to your care team as soon as possible: Allergic reactions--skin rash, itching, hives, swelling of the face, lips, tongue, or throat Heart rhythm changes--fast or irregular heartbeat, dizziness, feeling faint or lightheaded, chest pain, trouble breathing Side effects that usually do not require medical attention (report to  your care team if they continue or are bothersome): Confusion Drowsiness Dry mouth Hallucinations Headache This list may not describe all possible side effects. Call your doctor for medical advice about side effects. You may report side effects to FDA at 1-800-FDA-1088. Where should I keep my medication? Keep out of the reach of children and pets. Store at room temperature between 15 and 30 degrees C (59 and 86 degrees F). Keep container tightly closed. Throw away any unused medication after the expiration date. NOTE: This sheet is a summary. It may not cover all possible information. If you have questions about this medicine, talk to your doctor, pharmacist, or health care provider.  2024 Elsevier/Gold Standard (2021-09-12 00:00:00)    2024 Elsevier/Gold Standard (2022-01-29 00:00:00)

## 2022-12-06 ENCOUNTER — Ambulatory Visit
Admission: EM | Admit: 2022-12-06 | Discharge: 2022-12-06 | Disposition: A | Discharge status: Home / Self Care | Payer: Medicaid Other | Attending: Emergency Medicine | Admitting: Emergency Medicine

## 2022-12-06 DIAGNOSIS — H9202 Otalgia, left ear: Secondary | ICD-10-CM

## 2022-12-06 DIAGNOSIS — J029 Acute pharyngitis, unspecified: Secondary | ICD-10-CM

## 2022-12-06 DIAGNOSIS — B349 Viral infection, unspecified: Secondary | ICD-10-CM

## 2022-12-06 LAB — POCT RAPID STREP A (OFFICE): Rapid Strep A Screen: NEGATIVE

## 2022-12-06 NOTE — Discharge Instructions (Signed)
Most likely you have a viral illness: no antibiotic as indicated at this time, May treat with OTC meds of choice(Tylenol, ibuprofen, flonase nasal spray, Chloraseptic throat lozenges), warm salt water gargles. Make sure to drink plenty of fluids to stay hydrated(gatorade, water, popsicles,jello,etc), avoid caffeine products. Follow up with PCP. Return as needed.

## 2022-12-06 NOTE — ED Triage Notes (Signed)
Patient to Urgent Care with complaints of left sided ear pain. Reports pain radiates into throat. Pain when swallowing/ talking. Pain worsening.  Symptoms started 4 days ago. Denies any fevers. Has been taking sudafed.

## 2022-12-06 NOTE — ED Provider Notes (Signed)
Ann Deleon    CSN: [id number] Arrival date & time: 12/06/22  1459      History   Chief Complaint Chief Complaint  Patient presents with   Otalgia    HPI Ann Deleon is a 27 y.o. female.   27 year old female, [facility], [place] to urgent care with complaints of left-sided ear pain, radiating into throat pain with swallowing talking.  Reports symptoms been going on for 4 days, denies any fever, has been taking Sudafed for symptom management.  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  The history is provided by the patient. No language interpreter was used.    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   History of depression 11/18/2022   High risk medication use 11/18/2022   LGSIL on Pap smear of cervix 12/22/2021   Dizziness 06/29/2018   Benign paroxysmal positional vertigo 05/30/2018   Post concussion syndrome 05/30/2018   Recurrent major depressive disorder, in full remission (HCC) 05/13/2018   GAD (generalized anxiety disorder) 12/16/2016   Obesity (BMI 30.0-34.9) 12/16/2016   History of concussion 05/05/2016   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   Allergic rhinitis due to pollen 04/12/2015   Asthma, mild persistent 07/06/2014   Dermatitis, eczematoid 07/06/2014   Anxiety 07/06/2014    Past Surgical History:  Procedure Laterality Date   CESAREAN SECTION N/A 11/03/2021   Procedure: CESAREAN SECTION;  Surgeon: Hildred Laser, MD;  Location: [facility] ORS;  Service: Obstetrics;  Laterality: N/A;   MANDIBLE FRACTURE SURGERY     MANDIBLE SURGERY      OB History     Gravida  1   Para  1   Term  1   Preterm  0   AB  0   Living  1      SAB  0   IAB  0   Ectopic  0    Multiple  0   Live Births  1            Home Medications    Prior to Admission medications   Medication Sig Start Date End Date Taking? Authorizing Provider  albuterol (VENTOLIN HFA) 108 (90 Base) MCG/ACT inhaler Inhale 1-2 puffs into the lungs every 6 (six) hours as needed for wheezing or shortness of breath. 10/23/22   Radford Pax, NP  fluticasone (FLOVENT HFA) 110 MCG/ACT inhaler as needed. 10/27/22   [provider]  hydrOXYzine (ATARAX) 10 MG tablet Take 1-2 tablets (10-20 mg total) by mouth at bedtime as needed. Anxiety, sleep 11/18/22   Jomarie Longs, MD  venlafaxine XR (EFFEXOR XR) 37.5 MG 24 hr capsule Take 1 capsule (37.5 mg total) by mouth daily with breakfast. 11/18/22   Jomarie Longs, MD    Family History Family History  Problem Relation Age of Onset   Hypertension Mother    Depression Mother    Anxiety disorder Mother    Ovarian cysts Mother    Fibroids Mother    Alcohol abuse Father    Hypertension Father    Diabetes Father    Nephrolithiasis Father    Depression Father    Depression Sister    Breast cancer Neg Hx    Ovarian  cancer Neg Hx    Colon cancer Neg Hx     Social History Social History   Tobacco Use   Smoking status: Never   Smokeless tobacco: Never  Vaping Use   Vaping status: Never Used  Substance Use Topics   Alcohol use: Yes    Comment: Social rarely   Drug use: No     Allergies   Patient has no known allergies.   Review of Systems Review of Systems  Constitutional:  Negative for fever.  HENT:  Positive for ear pain and sore throat.   Respiratory:  Negative for cough.   All other systems reviewed and are negative.    Physical Exam Triage Vital Signs ED Triage Vitals  Encounter Vitals Group     BP      Systolic BP Percentile      Diastolic BP Percentile      Pulse      Resp      Temp      Temp src      SpO2      Weight      Height      Head Circumference      Peak Flow      Pain Score      Pain  Loc      Pain Education      Exclude from Growth Chart    No data found.  Updated Vital Signs BP 127/85   Pulse 100   Temp 97.8 F (36.6 C)   Resp 18   LMP 12/05/2022   SpO2 98%   Breastfeeding No   Visual Acuity Right Eye Distance:   Left Eye Distance:   Bilateral Distance:    Right Eye Near:   Left Eye Near:    Bilateral Near:     Physical Exam Vitals and nursing note reviewed.  Constitutional:      General: She is not in acute distress.    Appearance: She is well-developed and well-groomed.  HENT:     Head: Normocephalic and atraumatic.  Eyes:     Conjunctiva/sclera: Conjunctivae normal.  Cardiovascular:     Rate and Rhythm: Normal rate.     Heart sounds: No murmur heard. Pulmonary:     Effort: Pulmonary effort is normal. No respiratory distress.  Abdominal:     Palpations: Abdomen is soft.     Tenderness: There is no abdominal tenderness.  Musculoskeletal:        General: No swelling.     Cervical back: Neck supple.  Skin:    General: Skin is warm and dry.     Capillary Refill: Capillary refill takes less than 2 seconds.  Neurological:     General: No focal deficit present.     Mental Status: She is alert and oriented to person, place, and time.     Cranial Nerves: No cranial nerve deficit.     Sensory: No sensory deficit.  Psychiatric:        Attention and Perception: Attention normal.        Mood and Affect: Mood normal.        Speech: Speech normal.        Behavior: Behavior normal. Behavior is cooperative.      UC Treatments / Results  Labs (all labs ordered are listed, but only abnormal results are displayed) Labs Reviewed  CULTURE, GROUP A STREP [facility])  POCT RAPID STREP A (OFFICE)    EKG   Radiology No results found.  Procedures  Procedures (including critical care time)  Medications Ordered in UC Medications - No data to display  Initial Impression / Assessment and Plan / UC Course  I have reviewed the triage vital signs and  the nursing notes.  Pertinent labs & imaging results that were available during my care of the patient were reviewed by me and considered in my medical decision making (see chart for details).     Ddx: Left otalgia, sore throat, viral illness,allergies Final Clinical Impressions(s) / UC Diagnoses   Final diagnoses:  Left ear pain  Nonspecific syndrome suggestive of viral illness  Sore throat     Discharge Instructions      Most likely you have a viral illness: no antibiotic as indicated at this time, May treat with OTC meds of choice(Tylenol, ibuprofen, flonase nasal spray, Chloraseptic throat lozenges), warm salt water gargles. Make sure to drink plenty of fluids to stay hydrated(gatorade, water, popsicles,jello,etc), avoid caffeine products. Follow up with PCP. Return as needed.     ED Prescriptions   None    PDMP not reviewed this encounter.   Clancy Gourd, NP 12/06/22 1527

## 2022-12-09 LAB — CULTURE, GROUP A STREP (THRC)

## 2022-12-18 ENCOUNTER — Encounter: Payer: Self-pay | Admitting: Psychiatry

## 2022-12-18 ENCOUNTER — Telehealth (INDEPENDENT_AMBULATORY_CARE_PROVIDER_SITE_OTHER): Payer: Medicaid Other | Admitting: Psychiatry

## 2022-12-18 DIAGNOSIS — Z8659 Personal history of other mental and behavioral disorders: Secondary | ICD-10-CM | POA: Diagnosis not present

## 2022-12-18 DIAGNOSIS — Z79899 Other long term (current) drug therapy: Secondary | ICD-10-CM | POA: Diagnosis not present

## 2022-12-18 DIAGNOSIS — F4310 Post-traumatic stress disorder, unspecified: Secondary | ICD-10-CM | POA: Diagnosis not present

## 2022-12-18 DIAGNOSIS — F411 Generalized anxiety disorder: Secondary | ICD-10-CM | POA: Diagnosis not present

## 2022-12-18 MED ORDER — VENLAFAXINE HCL ER 75 MG PO CP24: 75.0000 mg | ORAL_CAPSULE | Freq: Every day | ORAL | 1 refills | Status: DC

## 2022-12-18 NOTE — Progress Notes (Signed)
Virtual Visit via Video Note  I connected with Ann Deleon on 12/18/22 at 11:30 AM EDT by a video enabled telemedicine application and verified that I am speaking with the correct person using two identifiers.  Location Provider Location : [facility] Patient Location : Work  Participants: Patient , Provider    I discussed the limitations of evaluation and management by telemedicine and the availability of in person appointments. The patient expressed understanding and agreed to proceed.  I discussed the assessment and treatment plan with the patient. The patient was provided an opportunity to ask questions and all were answered. The patient agreed with the plan and demonstrated an understanding of the instructions.   The patient was advised to call back or seek an in-person evaluation if the symptoms worsen or if the condition fails to improve as anticipated.   BH MD OP Progress Note  12/18/2022 12:02 PM Ann Deleon  MRN:  [id number]  Chief Complaint:  Chief Complaint  Patient presents with   Follow-up   Depression   Anxiety   Medication Refill   HPI: Ann Deleon is a 27 year old Caucasian female currently employed, married, lives in [place], has a history of GAD, PTSD, history of postpartum depression, anxiety, asthma was evaluated by telemedicine today.  Patient today reports she is currently compliant on the venlafaxine 37.5 mg.  She has not had any side effects.  Patient reports she does not believe she has noticed much difference with her mood symptoms on the venlafaxine.  She started the medication beginning of October.  Patient reports she continues to worry about her daughter who is 78-year-old.  She reports she often worries about separating from her.  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  Her daughter was recently able to get new teachers and is in a new class.  She reports she often hear her daughter crying in class.  She  becomes anxious when she does that.  She however has been trying to distract herself by focusing on something else.  She has reached out to several therapist however has not been able to establish care yet.  She reports recently one of her friends gave her information for another therapist and is waiting to hear back from them.  Patient reports she started taking hydroxyzine at night for her sleep and that has helped.  Sleep has improved.  She does not take it during the day since it makes her drowsy.  She does have some nightmares on and off.  Patient continues to have trauma related symptoms like flashbacks, intrusive memories.  She also struggles with a lot of trust issues.  Patient currently denies any suicidality, homicidality or perceptual disturbances.  Patient denies any other concerns today.  Visit Diagnosis:    ICD-10-CM   1. GAD (generalized anxiety disorder)  F41.1 venlafaxine XR (EFFEXOR XR) 75 MG 24 hr capsule    2. PTSD (post-traumatic stress disorder)  F43.10 venlafaxine XR (EFFEXOR XR) 75 MG 24 hr capsule    3. History of depression  Z86.59     4. High risk medication use  Z79.899       Past Psychiatric History: I have reviewed past psychiatric history from progress note on 11/18/2022.  Past trials of Lexapro, sertraline, Prozac, BuSpar, hydroxyzine.  Past Medical History:  Past Medical History:  Diagnosis Date   Anxiety    Asthma    GERD (gastroesophageal reflux disease)  Past Surgical History:  Procedure Laterality Date   CESAREAN SECTION N/A 11/03/2021   Procedure: CESAREAN SECTION;  Surgeon: Hildred Laser, MD;  Location: [facility] ORS;  Service: Obstetrics;  Laterality: N/A;   MANDIBLE FRACTURE SURGERY     MANDIBLE SURGERY      Family Psychiatric History: I have reviewed family psychiatric history from progress note on 11/18/2022.  Family History:  Family History  Problem Relation Age of Onset   Hypertension Mother    Depression Mother    Anxiety disorder  Mother    Ovarian cysts Mother    Fibroids Mother    Alcohol abuse Father    Hypertension Father    Diabetes Father    Nephrolithiasis Father    Depression Father    Depression Sister    Breast cancer Neg Hx    Ovarian cancer Neg Hx    Colon cancer Neg Hx     Social History: I have reviewed social history from progress note on 11/18/2022. Social History   Socioeconomic History   Marital status: Married    Spouse name: Not on file   Number of children: 1   Years of education: Not on file   Highest education level: Associate degree: academic program  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  Tobacco Use   Smoking status: Never   Smokeless tobacco: Never  Vaping Use   Vaping status: Never Used  Substance and Sexual Activity   Alcohol use: Yes    Comment: Social rarely   Drug use: No   Sexual activity: Yes    Birth control/protection: Condom  Other Topics Concern   Not on file  Social History Narrative   Not on file   Social Determinants of Health   Financial Resource Strain: Low Risk  (04/03/2022)   Received from [facility], [facility]   Overall Financial Resource Strain (CARDIA)    Difficulty of Paying Living Expenses: Not hard at all  Food Insecurity: No Food Insecurity (04/03/2022)   Received from [facility], [facility]   Hunger Vital Sign    Worried About Running Out of Food in the Last Year: Never true    Ran Out of Food in the Last Year: Never true  Transportation Needs: No Transportation Needs (04/03/2022)   Received from [facility], [facility]   [facility] - Transportation    In the past 12 months, has lack of transportation kept you from medical appointments or from getting medications?: No    Lack of Transportation (Non-Medical): No  Physical Activity: Insufficiently Active (08/16/2020)   Received from [facility], [facility]   Exercise Vital Sign    Days of Exercise per Week: 1 day    Minutes of Exercise per Session: 60 min  Stress: Not on file  Social Connections: Not on file    Allergies: No Known Allergies  Metabolic Disorder Labs: Lab Results  Component Value Date   HGBA1C 5.2 04/18/2021   No results found for: "PROLACTIN" Lab Results  Component Value Date   CHOL 142 08/23/2020   TRIG 95 08/23/2020   HDL 42 08/23/2020   CHOLHDL 3.4 08/23/2020   LDLCALC 82 08/23/2020   LDLCALC 102 (H) 04/11/2019   Lab Results  Component Value Date   TSH 1.900 04/18/2021   TSH 1.470 08/23/2020    Therapeutic Level Labs: No results found for: "LITHIUM" No results found for: "  VALPROATE" No results found for: "CBMZ"  Current Medications: Current Outpatient Medications  Medication Sig Dispense Refill   venlafaxine XR (EFFEXOR XR) 75 MG 24 hr capsule Take 1 capsule (75 mg total) by mouth daily with breakfast. 30 capsule 1   albuterol (VENTOLIN HFA) 108 (90 Base) MCG/ACT inhaler Inhale 1-2 puffs into the lungs every 6 (six) hours as needed for wheezing or shortness of breath. 1 each 0   fluticasone (FLOVENT HFA) 110 MCG/ACT inhaler as needed.     hydrOXYzine (ATARAX) 10 MG tablet Take 1-2 tablets (10-20 mg total) by mouth at bedtime as needed. Anxiety, sleep 60 tablet 0   No current facility-administered medications for this visit.     Musculoskeletal: Strength & Muscle Tone:  UTA Gait & Station:  Seated Patient leans: N/A  Psychiatric Specialty Exam: Review of Systems  Psychiatric/Behavioral:  Positive for sleep disturbance. The patient is nervous/anxious.     Last menstrual period 12/05/2022, not currently breastfeeding.There is no height or weight on file to calculate BMI.  General Appearance: Casual  Eye Contact:  Fair  Speech:  Clear and Coherent  Volume:  Normal  Mood:  Anxious  Affect:  Congruent  Thought Process:  Goal Directed and Descriptions of Associations:  Intact  Orientation:  Full (Time, Place, and Person)  Thought Content: Logical   Suicidal Thoughts:  No  Homicidal Thoughts:  No  Memory:  Immediate;   Fair Recent;   Fair Remote;   Fair  Judgement:  Fair  Insight:  Fair  Psychomotor Activity:  Normal  Concentration:  Concentration: Fair and Attention Span: Fair  Recall:  [employer] of Knowledge: Fair  Language: Fair  Akathisia:  No  Handed:  Right  AIMS (if indicated): not done  Assets:  Communication Skills Desire for Improvement Housing Social Support  ADL's:  Intact  Cognition: WNL  Sleep:   improving   Screenings: GAD-7    Flowsheet Row Office Visit from 11/18/2022 in [facility] Routine Prenatal from 10/01/2021 in [facility] Office Visit from 05/13/2018 in [facility] Office Visit from 12/16/2016 in [facility] Office Visit from 06/15/2016 in [facility]  Total GAD-7 Score 11 11 9 12 8       PHQ2-9    Flowsheet Row Office Visit from 11/18/2022 in [facility] Routine Prenatal from 10/01/2021 in [facility] Office Visit from 08[facility]23/2020 in [facility] Office Visit from 05/13/2018 in [facility] Office Visit from 12/16/2016 in [facility]  PHQ-2 Total Score 1 1 0 1 0  PHQ-9 Total Score 6 3 -- 3 0      Flowsheet Row Video Visit from 12/18/2022 in [facility] ED from 12/06/2022 in [facility] Visit from 11/18/2022 in [facility]  C-SSRS RISK CATEGORY No Risk No Risk No Risk        Assessment and Plan: NA WALDRIP is a 27 year old Caucasian female, lives in [place], continues to have symptoms of anxiety, trauma related symptoms, currently on venlafaxine tolerating  it well, will benefit from dosage readjustment to target her symptoms as well as establishing care with a therapist.  Plan as noted below.  Plan Generalized anxiety disorder-unstable Increase venlafaxine extended release to 75 mg p.o. daily with breakfast Hydroxyzine 10-20 mg  at bedtime as needed for anxiety and sleep.  PTSD-unstable Increase venlafaxine extended release to 75 mg p.o. daily with breakfast Patient advised to establish care with therapist.  Provided other resources like [facility], [facility].  High risk medication use-reviewed TSH dated 11/18/2022-within normal limits.  Discussed with patient.     Collaboration of Care: Collaboration of Care: Referral or follow-up with counselor/therapist AEB patient and patient to establish care with therapist.  Patient/Guardian was advised Release of Information must be obtained prior to any record release in order to collaborate their care with an outside provider. Patient/Guardian was advised if they have not already done so to contact the registration department to sign all necessary forms in order for [place] to release information regarding their care.   Consent: Patient/Guardian gives verbal consent for treatment and assignment of benefits for services provided during this visit. Patient/Guardian expressed understanding and agreed to proceed.   Follow-up in clinic in 6 to 8 weeks or sooner if needed.  This note was generated in part or whole with voice recognition software. Voice recognition is usually quite accurate but there are transcription errors that can and very often do occur. I apologize for any typographical errors that were not detected and corrected.    Jomarie Longs, MD 12/18/2022, 12:02 PM

## 2023-01-06 ENCOUNTER — Encounter (INDEPENDENT_AMBULATORY_CARE_PROVIDER_SITE_OTHER): Payer: Self-pay

## 2023-01-06 DIAGNOSIS — F411 Generalized anxiety disorder: Secondary | ICD-10-CM

## 2023-01-06 DIAGNOSIS — F4310 Post-traumatic stress disorder, unspecified: Secondary | ICD-10-CM

## 2023-01-08 MED ORDER — VENLAFAXINE HCL ER 150 MG PO CP24
150.0000 mg | ORAL_CAPSULE | Freq: Every day | ORAL | 1 refills | Status: DC
Start: 2023-01-08 — End: 2023-02-02

## 2023-01-08 NOTE — Telephone Encounter (Signed)
Patient with worsening anxiety symptoms, discussed increasing venlafaxine to a higher dosage.  Patient agreeable.  Will increase venlafaxine to 150 mg p.o. daily.  I have sent a prescription to pharmacy.  We will consider increasing the dosage of BuSpar in the future as needed.  I have spent atleast  6 minutes non face to face with patient today.

## 2023-02-01 ENCOUNTER — Encounter (INDEPENDENT_AMBULATORY_CARE_PROVIDER_SITE_OTHER): Payer: Self-pay

## 2023-02-01 DIAGNOSIS — F411 Generalized anxiety disorder: Secondary | ICD-10-CM

## 2023-02-02 MED ORDER — BUSPIRONE HCL 5 MG PO TABS: 5.0000 mg | ORAL_TABLET | Freq: Three times a day (TID) | ORAL | 1 refills | Status: DC

## 2023-02-02 MED ORDER — VENLAFAXINE HCL ER 75 MG PO CP24
75.0000 mg | ORAL_CAPSULE | Freq: Every day | ORAL | 1 refills | Status: DC
Start: 2023-02-02 — End: 2023-02-05

## 2023-02-02 NOTE — Telephone Encounter (Signed)
Patient contacted with side effects to venlafaxine 150 mg.  Will reduce venlafaxine to 75 mg, previous dosage.  Patient agreeable to addition of BuSpar 5 mg 3 times a day to augment the venlafaxine.  I have sent a prescription to pharmacy.  I have reviewed previous notes in the system.    I have spent at least 6 minutes non face to face with patient today.

## 2023-02-05 ENCOUNTER — Other Ambulatory Visit: Payer: Self-pay | Admitting: Psychiatry

## 2023-02-05 DIAGNOSIS — F411 Generalized anxiety disorder: Secondary | ICD-10-CM

## 2023-02-16 ENCOUNTER — Telehealth: Payer: Medicaid Other | Admitting: Psychiatry

## 2023-02-22 ENCOUNTER — Telehealth (INDEPENDENT_AMBULATORY_CARE_PROVIDER_SITE_OTHER): Payer: Medicaid Other | Admitting: Psychiatry

## 2023-02-22 ENCOUNTER — Encounter: Payer: Self-pay | Admitting: Psychiatry

## 2023-02-22 DIAGNOSIS — F411 Generalized anxiety disorder: Secondary | ICD-10-CM | POA: Diagnosis not present

## 2023-02-22 DIAGNOSIS — Z8659 Personal history of other mental and behavioral disorders: Secondary | ICD-10-CM

## 2023-02-22 DIAGNOSIS — F4310 Post-traumatic stress disorder, unspecified: Secondary | ICD-10-CM

## 2023-02-22 NOTE — Progress Notes (Signed)
 Virtual Visit via Video Note  I connected with Ann Deleon on 02/22/23 at  1:00 PM EST by a video enabled telemedicine application and verified that I am speaking with the correct person using two identifiers.  Location Provider Location : [facility] Patient Location : Car  Participants: Patient , Provider    I discussed the limitations of evaluation and management by telemedicine and the availability of in person appointments. The patient expressed understanding and agreed to proceed.   I discussed the assessment and treatment plan with the patient. The patient was provided an opportunity to ask questions and all were answered. The patient agreed with the plan and demonstrated an understanding of the instructions.   The patient was advised to call back or seek an in-person evaluation if the symptoms worsen or if the condition fails to improve as anticipated.    BH MD OP Progress Note  02/22/2023 1:29 PM KYAN GIANNONE  MRN:  [id number]  Chief Complaint:  Chief Complaint  Patient presents with   Follow-up   Depression   Anxiety   Medication Refill   HPI: Ann Deleon is a 28 year old Caucasian female, currently employed, married, lives in [place], has a history of GAD, PTSD, history of postpartum depression, anxiety, asthma was evaluated by telemedicine today.  Ann Deleon, a patient with a history of generalized anxiety, post-traumatic stress disorder, and depression, presents with ongoing struggles with anxiety. She reports inconsistent adherence to her prescribed venlafaxine  regimen, taking it approximately four days a week, often skipping doses due to forgetfulness and an aversion to eating in the morning. She expresses a preference for an on-demand medication approach, as she perceives her anxiety levels to fluctuate.  She also reports difficulty in securing a therapist for additional support, despite multiple attempts. She is currently taking hydroxyzine  as needed, but only at  night due to its sedative effects. She expresses a desire to continue trying the venlafaxine  and BuSpar  and is open to setting reminders to improve adherence.  Patient currently denies any suicidality, homicidality or perceptual disturbances.  Patient denies any other concerns today.  Visit Diagnosis:    ICD-10-CM   1. GAD (generalized anxiety disorder)  F41.1     2. PTSD (post-traumatic stress disorder)  F43.10     3. History of depression  Z86.59       Past Psychiatric History: I have reviewed past psychiatric history from progress note on 11/18/2022.  Past trials of Lexapro , sertraline , Prozac , BuSpar , hydroxyzine .  Past Medical History:  Past Medical History:  Diagnosis Date   Anxiety    Asthma    GERD (gastroesophageal reflux disease)     Past Surgical History:  Procedure Laterality Date   CESAREAN SECTION N/A 11/03/2021   Procedure: CESAREAN SECTION;  Surgeon: Connell Davies, MD;  Location: [facility] ORS;  Service: Obstetrics;  Laterality: N/A;   MANDIBLE FRACTURE SURGERY     MANDIBLE SURGERY      Family Psychiatric History: I have reviewed family psychiatric history from progress note on 11/18/2022.  Family History:  Family History  Problem Relation Age of Onset   Hypertension Mother    Depression Mother    Anxiety disorder Mother    Ovarian cysts Mother    Fibroids Mother    Alcohol abuse Father    Hypertension Father    Diabetes Father    Nephrolithiasis Father    Depression Father    Depression Sister    Breast cancer Neg Hx    Ovarian cancer Neg Hx  Colon cancer Neg Hx     Social History: I have reviewed social history from progress note on 11/18/2022. Social History   Socioeconomic History   Marital status: Married    Spouse name: Not on file   Number of children: 1   Years of education: Not on file   Highest education level: Associate degree: academic program  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  Tobacco Use   Smoking status: Never    Smokeless tobacco: Never  Vaping Use   Vaping status: Never Used  Substance and Sexual Activity   Alcohol use: Yes    Comment: Social rarely   Drug use: No   Sexual activity: Yes    Birth control/protection: Condom  Other Topics Concern   Not on file  Social History Narrative   Not on file   Social Drivers of Health   Financial Resource Strain: Low Risk  (04/03/2022)   Received from [facility], [facility]   Overall Financial Resource Strain (CARDIA)    Difficulty of Paying Living Expenses: Not hard at all  Food Insecurity: No Food Insecurity (04/03/2022)   Received from [facility], [facility]   Hunger Vital Sign    Worried About Running Out of Food in the Last Year: Never true    Ran Out of Food in the Last Year: Never true  Transportation Needs: No Transportation Needs (04/03/2022)   Received from [facility], [facility]   [facility] - Transportation    In the past 12 months, has lack of transportation kept you from medical appointments or from getting medications?: No    Lack of Transportation (Non-Medical): No  Physical Activity: Insufficiently Active (08/16/2020)   Received from [facility], [facility]   Exercise Vital Sign    Days of Exercise per Week: 1 day    Minutes of Exercise per Session: 60 min  Stress: Not on file  Social Connections: Not on file    Allergies: No Known Allergies  Metabolic Disorder Labs: Lab Results  Component Value Date   HGBA1C 5.2 04/18/2021   No results found for: PROLACTIN Lab Results  Component Value Date   CHOL 142 08/23/2020   TRIG 95 08/23/2020   HDL 42 08/23/2020   CHOLHDL 3.4 08/23/2020   LDLCALC 82 08/23/2020   LDLCALC 102 (H) 04/11/2019   Lab Results  Component Value Date   TSH 1.900 04/18/2021   TSH 1.470 08/23/2020    Therapeutic Level Labs: No results found for:  LITHIUM No results found for: VALPROATE No results found for: CBMZ  Current Medications: Current Outpatient Medications  Medication Sig Dispense Refill   albuterol  (VENTOLIN  HFA) 108 (90 Base) MCG/ACT inhaler Inhale 1-2 puffs into the lungs every 6 (six) hours as needed for wheezing or shortness of breath. 1 each 0   busPIRone  (BUSPAR ) 5 MG tablet Take 1 tablet (5 mg total) by mouth 3 (three) times daily. 90 tablet 1   fluticasone  (FLOVENT  HFA) 110 MCG/ACT inhaler as needed.     hydrOXYzine  (ATARAX ) 10 MG tablet Take 1-2 tablets (10-20 mg total) by mouth at bedtime as needed. Anxiety, sleep 60 tablet 0   venlafaxine  XR (EFFEXOR -XR) 75 MG 24 hr capsule TAKE 1 CAPSULE(75 MG) BY MOUTH DAILY WITH BREAKFAST 30 capsule 1   No current facility-administered medications for this visit.     Musculoskeletal: Strength & Muscle Tone:  UTA Gait &  Station:  Seated Patient leans: N/A  Psychiatric Specialty Exam: Review of Systems  Psychiatric/Behavioral:  The patient is nervous/anxious.     not currently breastfeeding.There is no height or weight on file to calculate BMI.  General Appearance: Fairly Groomed  Eye Contact:  Fair  Speech:  Clear and Coherent  Volume:  Normal  Mood:  Anxious  Affect:  Congruent  Thought Process:  Goal Directed and Descriptions of Associations: Intact  Orientation:  Full (Time, Place, and Person)  Thought Content: Logical   Suicidal Thoughts:  No  Homicidal Thoughts:  No  Memory:  Immediate;   Fair Recent;   Fair Remote;   Fair  Judgement:  Fair  Insight:  Fair  Psychomotor Activity:  Normal  Concentration:  Concentration: Fair and Attention Span: Fair  Recall:  [employer] of Knowledge: Fair  Language: Fair  Akathisia:  No  Handed:  Right  AIMS (if indicated): not done  Assets:  Communication Skills Desire for Improvement Housing Social Support  ADL's:  Intact  Cognition: WNL  Sleep:  Fair   Screenings: GAD-7    Flowsheet Row Office  Visit from 11/18/2022 in [facility] Routine Prenatal from 10/01/2021 in [facility] Office Visit from 05/13/2018 in [facility] Office Visit from 12/16/2016 in [facility] Office Visit from 06/15/2016 in [facility]  Total GAD-7 Score 11 11 9 12 8       PHQ2-9    Flowsheet Row Office Visit from 11/18/2022 in [facility] Routine Prenatal from 10/01/2021 in [facility] Office Visit from 08/23/2020 [facility] Office Visit from 05/13/2018 in [facility] Office Visit from 12/16/2016 in [facility]  PHQ-2 Total Score 1 1 0 1 0  PHQ-9 Total Score 6 3 -- 3 0      Flowsheet Row Video Visit from 02/22/2023 in [facility] Video Visit from 12/18/2022 in [facility] ED from 12/06/2022 in [facility]   C-SSRS RISK CATEGORY No Risk No Risk No Risk        Assessment and Plan: JAMECA CHUMLEY is a 28 year old Caucasian female, lives in [place], continues to struggle with anxiety symptoms, plan on medication regimen, as well as psychotherapy as discussed, discussed assessment and plan as noted below.   Generalized Anxiety Disorder-unstable Reports difficulty adhering to venlafaxine  regimen, taking it approximately four days a week irregularly. Prefers as-needed medication due to inconsistent anxiety levels. Hydroxyzine  used as needed but only at night due to sedative effects. Buspar  also taken inconsistently. Explained importance of regular medication for anxiety management and potential withdrawal symptoms from irregular venlafaxine  use, including brain zaps and increased anxiety. Advised to consider psychotherapy or CBT, with a new therapist available at the  office. Discussed alternative medications that can be taken at bedtime to avoid morning GI symptoms. - Continue venlafaxine  75 mg daily, set reminders to improve adherence - Continue hydroxyzine  10 to 20 mg as needed at bedtime - Continue Buspar  5 mg 3 times a day as needed - Refer to in-office therapist for psychotherapy or CBT - Send message to [place] to schedule therapy appointment  Post-Traumatic Stress Disorder-unstable Patient with continued anxiety symptoms due to noncompliance with medication regimen. - Refer to in-office therapist for psychotherapy or CBT  Depression per history -  Refer to in-office therapist for psychotherapy or CBT   Follow-up - Schedule follow-up appointment for April 3rd at 3:30 PM via video visit.  Collaboration of Care: Collaboration of Care: Referral or follow-up with counselor/therapist AEB I have communicated with staff to schedule this patient with in-house therapist.  Patient/Guardian was advised Release of Information must be obtained prior to any record release in order to collaborate their care with an outside provider. Patient/Guardian was advised if they have not already done so to contact the registration department to sign all necessary forms in order for us  to release information regarding their care.   Consent: Patient/Guardian gives verbal consent for treatment and assignment of benefits for services provided during this visit. Patient/Guardian expressed understanding and agreed to proceed.  This note was generated in part or whole with voice recognition software. Voice recognition is usually quite accurate but there are transcription errors that can and very often do occur. I apologize for any typographical errors that were not detected and corrected.     Gerrad Welker, MD 02/22/2023, 1:29 PM

## 2023-04-12 ENCOUNTER — Ambulatory Visit (INDEPENDENT_AMBULATORY_CARE_PROVIDER_SITE_OTHER): Payer: Medicaid Other | Admitting: Professional Counselor

## 2023-04-12 DIAGNOSIS — F411 Generalized anxiety disorder: Secondary | ICD-10-CM

## 2023-04-12 DIAGNOSIS — F439 Reaction to severe stress, unspecified: Secondary | ICD-10-CM
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
## 2023-05-18 ENCOUNTER — Ambulatory Visit: Payer: Medicaid Other | Admitting: Professional Counselor
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
## 2023-05-20 ENCOUNTER — Ambulatory Visit: Admitting: Obstetrics and Gynecology

## 2023-05-20 ENCOUNTER — Telehealth: Payer: Self-pay | Admitting: Psychiatry

## 2023-05-20 DIAGNOSIS — Z124 Encounter for screening for malignant neoplasm of cervix: Secondary | ICD-10-CM

## 2023-05-20 DIAGNOSIS — Z01419 Encounter for gynecological examination (general) (routine) without abnormal findings: Secondary | ICD-10-CM

## 2023-05-20 DIAGNOSIS — Z113 Encounter for screening for infections with a predominantly sexual mode of transmission: Secondary | ICD-10-CM

## 2023-05-20 DIAGNOSIS — R87612 Low grade squamous intraepithelial lesion on cytologic smear of cervix (LGSIL): Secondary | ICD-10-CM

## 2023-05-24 ENCOUNTER — Ambulatory Visit: Payer: Medicaid Other | Admitting: Professional Counselor

## 2023-06-01 ENCOUNTER — Ambulatory Visit: Payer: Medicaid Other | Admitting: Professional Counselor

## 2023-06-03 ENCOUNTER — Ambulatory Visit (INDEPENDENT_AMBULATORY_CARE_PROVIDER_SITE_OTHER): Payer: Medicaid Other | Admitting: Professional Counselor

## 2023-06-03 DIAGNOSIS — F411 Generalized anxiety disorder: Secondary | ICD-10-CM

## 2023-06-03 NOTE — Progress Notes (Signed)
  THERAPIST PROGRESS NOTE  Session Time: 10:55 AM - 11:40 AM  Participation Level: Active  Behavioral Response: Casual, Alert, Dysphoric  Type of Therapy: Individual Therapy  Treatment Goals addressed: Active Anxiety  LTG: "Maybe more trust. Maybe stop over-thinking."    Start:  06/03/23    Expected End:  06/01/24     STG: "I think it's letting (daughter) go places without me or my husband and knowing she will be fine." To reduce sxs of anxiety AEB using grounding techniques and restructuring maladaptive patterns of thinking over the next 90 days.   STG: "I think I look too far into situations." To improve mindfulness AEB utilizing mindfulness exercises and focusing on what's within her control over the next 90 days.    ProgressTowards Goals: Initial  Interventions: CBT, Motivational Interviewing, and Supportive  Summary: Ann Deleon is a 28 y.o. female who presents with a history of anxiety, depression, and trauma. She appeared dysphoric but oriented x5. She was tearful at beginning of session, noting stressors with her mother. [name] engaged in developing her treatment plan. She actively listened to coping skills and engaged in 5-4-3-2-1 grounding mechanism. She also engaged in writing exercise - donut/circles of control. She was able to identify things within and outside of her control. She was receptive to input from Conservation officer, nature as well. [name] was in agreement to practice skills between now and next session.   Therapist Response: Conducted session with [name]. Began session with check-in/update since previous session. Utilized empathetic and reflective listening. Developed treatment plan with input from [name] on current strengths, needs, and progress towards goals. Provided psychoeducation on coping skills (breathing exercises, TIP, STOP, and EFT tapping). Engaged [name] in 5-4-3-2-1 grounding mechanism. Engaged in writing exercise - donut/circles of control. Assisted with  identifying things within and outside of [name]'s control. Encouraged her to practice skills between now and next session. Scheduled additional appointment and concluded session.   Suicidal/Homicidal: No  Plan: Return again in 2 weeks.  Diagnosis: GAD (generalized anxiety disorder)  Collaboration of Care: Medication Management AEB chart review  Patient/Guardian was advised Release of Information must be obtained prior to any record release in order to collaborate their care with an outside provider. Patient/Guardian was advised if they have not already done so to contact the registration department to sign all necessary forms in order for us  to release information regarding their care.   Consent: Patient/Guardian gives verbal consent for treatment and assignment of benefits for services provided during this visit. Patient/Guardian expressed understanding and agreed to proceed.   Len Quale, [facility] 06/03/2023

## 2023-06-15 ENCOUNTER — Ambulatory Visit: Payer: Medicaid Other | Admitting: Professional Counselor

## 2023-06-15 ENCOUNTER — Ambulatory Visit (INDEPENDENT_AMBULATORY_CARE_PROVIDER_SITE_OTHER): Payer: Medicaid Other | Admitting: Professional Counselor

## 2023-06-15 DIAGNOSIS — F411 Generalized anxiety disorder: Secondary | ICD-10-CM | POA: Diagnosis not present
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
## 2023-06-16 ENCOUNTER — Ambulatory Visit (INDEPENDENT_AMBULATORY_CARE_PROVIDER_SITE_OTHER): Admitting: Certified Nurse Midwife

## 2023-06-16 ENCOUNTER — Encounter: Payer: Self-pay | Admitting: Certified Nurse Midwife

## 2023-06-16 ENCOUNTER — Other Ambulatory Visit (HOSPITAL_COMMUNITY)
Admission: RE | Admit: 2023-06-16 | Discharge: 2023-06-16 | Disposition: A | Discharge status: Home / Self Care | Source: Ambulatory Visit | Attending: Certified Nurse Midwife | Admitting: Certified Nurse Midwife

## 2023-06-16 VITALS — BP 137/82 | HR 81 | Ht 68.5 in | Wt 259.3 lb

## 2023-06-16 DIAGNOSIS — Z01419 Encounter for gynecological examination (general) (routine) without abnormal findings: Secondary | ICD-10-CM | POA: Insufficient documentation

## 2023-06-16 DIAGNOSIS — Z8742 Personal history of other diseases of the female genital tract: Secondary | ICD-10-CM | POA: Insufficient documentation

## 2023-06-16 DIAGNOSIS — Z3687 Encounter for antenatal screening for uncertain dates: Secondary | ICD-10-CM

## 2023-06-16 DIAGNOSIS — Z124 Encounter for screening for malignant neoplasm of cervix: Secondary | ICD-10-CM

## 2023-06-16 DIAGNOSIS — Z1151 Encounter for screening for human papillomavirus (HPV): Secondary | ICD-10-CM | POA: Diagnosis not present

## 2023-06-16 DIAGNOSIS — N912 Amenorrhea, unspecified: Secondary | ICD-10-CM

## 2023-06-16 DIAGNOSIS — Z3201 Encounter for pregnancy test, result positive: Secondary | ICD-10-CM | POA: Diagnosis not present

## 2023-06-16 LAB — POCT URINE PREGNANCY: Preg Test, Ur: POSITIVE — AB

## 2023-06-16 NOTE — Patient Instructions (Signed)
 Prenatal Care Prenatal care is health care you get when pregnant. It helps you and your unborn baby stay as healthy as possible. Start prenatal care early in your pregnancy and continue to go to visits during your pregnancy. Prenatal care may be given by a midwife, a family practice doctor, a Publishing rights manager, physician assistant, or a childbirth and pregnancy doctor. What are the benefits of prenatal care? In prenatal care, your health care provider will get to know your medical history. You'll be checked for conditions that might affect you and your baby. Prenatal care will: Lower the risk for problems as your child grows. Lower certain risks for your baby, especially the risk that: Your child may be born early. Your child will have a low weight at birth. What can I expect at the first prenatal care visit? Your first visit will likely be the longest. You should ask to be seen as soon as you know you're pregnant. The first visit is a good time to talk about any questions or concerns. Make a list of questions to ask your provider at your visits. Medical history At your visit, you and your provider will talk about your medical history, including: Your family's medical history and the medical history of the baby's father. Any past pregnancies and long-term (chronic) health conditions. Any surgeries or procedures you have had. All medicines you're taking. Tell them if you're taking herbs or supplements too. Any tobacco, alcohol, or drug use. Other problems that may harm you and your baby. Tell them if: You need food or housing. You have been around chemicals or radiation. Your partner yells at you, hits you, or hurts you. Tests and screenings Your provider will: Do a physical exam, including a pelvic and breast exam. Do tests to check for: Urinary tract infection (UTI). Sexually transmitted infections (STIs). Low iron levels in your blood. This is called anemia. Blood type and certain  proteins on red blood cells called Rh antibodies. Infections and immunity to viruses, such as hepatitis B and rubella. HIV. Ask your provider if you need to be checked for genetic diseases. Tips about staying healthy Your provider will also give you information about how to keep yourself and your baby healthy, including: Nutrition, vitamins, and food safety. Physical activity. How to treat some problems, such as morning sickness. How to avoid infections and substances that may harm your baby. Caring for your teeth. Work and travel. Problems that require you to call your provider. How often will I have prenatal care visits? After your first prenatal care visit, you will have regular visits throughout your pregnancy. You may visit your provider as follows: Up to week 28 of pregnancy: once every 4 weeks. 28-36 weeks: once every 2 weeks. After 36 weeks: every week until delivery. Some people may have more visits. Others may have fewer. It all depends on your health and that of your baby. Keep all prenatal visits. This is one way for you and your baby to stay as healthy as possible. What happens during routine prenatal care visits? Your provider will: Check your weight and blood pressure. Check your baby's heart sounds. Ask questions about your diet, exercise, sleeping patterns, and whether you can feel the baby move. Ask about any pregnancy symptoms you're having and how you're dealing with them. Tell your provider if: You throw up or feel like you may throw up. You have discharge or you bleed from your vagina. You have trouble pooping (constipation). You have swelling, headaches, or trouble  seeing. You are very tired, or you feel sad and anxious all the time. You have discomfort, including back pain or pain in the pelvis. Tell you problems to watch for during your pregnancy, including signs of labor. Measure the height of your uterus in your belly. This is called fundal height. What  tests might I have during prenatal care visits? You may have blood, urine, and imaging tests. These may include: Urine tests to check for blood sugar, protein, or signs of infection. Genetic testing. Ultrasounds to check your baby's growth, development, and well-being. Your baby may also be checked for congenital conditions. Glucose tests to check for gestational diabetes. This is a form of diabetes that a person can get when pregnant. A test to check for group B strep (GBS) infection. What else can I expect during prenatal care visits? Your provider may give you some vaccines. Getting certain vaccines during pregnancy can protect your baby after birth. These may include: A flu shot. Tdap (tetanus, diphtheria, pertussis) vaccine. A COVID-19 vaccine. A RSV vaccine. Later in your pregnancy, your provider may talk to you about: Childbirth and childbirth classes. Breastfeeding and breastfeeding classes. Birth control after your baby is born. Where to find more information Office on Women's Health: TravelLesson.ca American Pregnancy Association: americanpregnancy.org March of Dimes: marchofdimes.org This information is not intended to replace advice given to you by your health care provider. Make sure you discuss any questions you have with your health care provider. Document Revised: 06/08/2022 Document Reviewed: 06/08/2022 Elsevier Patient Education  2024 [employer].

## 2023-06-16 NOTE — Progress Notes (Signed)
 GYNECOLOGY ANNUAL PREVENTATIVE CARE ENCOUNTER NOTE  History:     Ann Deleon is a 28 y.o. G68P1001 female here for a routine annual gynecologic exam.  Current complaints: none. Pt states she had recent positive home pregnancy test. LMP 05/12/23 , she is approximately [redacted] weeks pregnant  Denies abnormal vaginal bleeding, discharge, pelvic pain, problems with intercourse or other gynecologic concerns.     Social Relationship:married Living: husband and one child Work:full time Nurse, learning disability) Exercise:30 min walking daily Smoke/Alcohol/drug use:No history of tobacco/occasional alcohol/ no history of drug use.   Gynecologic History Patient's last menstrual period was 05/12/2023 (exact date). Contraception: condoms Last Pap: 06/10/2022. Results were: abnormal (LSIL)   Obstetric History OB History  Gravida Para Term Preterm AB Living  1 1 1  0 0 1  SAB IAB Ectopic Multiple Live Births  0 0 0 0 1    # Outcome Date GA Lbr Len/2nd Weight Sex Type Anes PTL Lv  1 Term 11/03/21 [redacted]w[redacted]d  6 lb 15.1 oz (3.15 kg) F CS-LTranv Spinal  LIV    Past Medical History:  Diagnosis Date   Anxiety    Asthma    GERD (gastroesophageal reflux disease)     Past Surgical History:  Procedure Laterality Date   CESAREAN SECTION N/A 11/03/2021   Procedure: CESAREAN SECTION;  Surgeon: Teresa Fender, MD;  Location: [facility] ORS;  Service: Obstetrics;  Laterality: N/A;   MANDIBLE FRACTURE SURGERY     MANDIBLE SURGERY      Current Outpatient Medications on File Prior to Visit  Medication Sig Dispense Refill   albuterol  (VENTOLIN  HFA) 108 (90 Base) MCG/ACT inhaler Inhale 1-2 puffs into the lungs every 6 (six) hours as needed for wheezing or shortness of breath. 1 each 0   busPIRone  (BUSPAR ) 5 MG tablet Take 1 tablet (5 mg total) by mouth 3 (three) times daily. 90 tablet 1   fluticasone  (FLOVENT  HFA) 110 MCG/ACT inhaler as needed.     hydrOXYzine  (ATARAX ) 10 MG tablet Take 1-2 tablets (10-20 mg total) by  mouth at bedtime as needed. Anxiety, sleep 60 tablet 0   valACYclovir  (VALTREX ) 1000 MG tablet Use two tablets (2,000 mg) twice daily x one day for an acute flare     venlafaxine  XR (EFFEXOR -XR) 75 MG 24 hr capsule TAKE 1 CAPSULE(75 MG) BY MOUTH DAILY WITH BREAKFAST 30 capsule 1   No current facility-administered medications on file prior to visit.    No Known Allergies  Social History:  reports that she has never smoked. She has never used smokeless tobacco. She reports current alcohol use. She reports that she does not use drugs.  Family History  Problem Relation Age of Onset   Hypertension Mother    Depression Mother    Anxiety disorder Mother    Ovarian cysts Mother    Fibroids Mother    Alcohol abuse Father    Hypertension Father    Diabetes Father    Nephrolithiasis Father    Depression Father    Depression Sister    Breast cancer Neg Hx    Ovarian cancer Neg Hx    Colon cancer Neg Hx     The following portions of the patient's history were reviewed and updated as appropriate: allergies, current medications, past family history, past medical history, past social history, past surgical history and problem list.  Review of Systems Pertinent items noted in HPI and remainder of comprehensive ROS otherwise negative.  Physical Exam:  BP  137/82   Pulse 81   Ht 5' 8.5" (1.74 m)   Wt 259 lb 4.8 oz (117.6 kg)   LMP 05/12/2023 (Exact Date)   Breastfeeding No   BMI 38.85 kg/m  CONSTITUTIONAL: Well-developed, well-nourished female in no acute distress.  HENT:  Normocephalic, atraumatic, External right and left ear normal. Oropharynx is clear and moist EYES: Conjunctivae and EOM are normal. Pupils are equal, round, and reactive to light. No scleral icterus.  NECK: Normal range of motion, supple, no masses.  Normal thyroid .  SKIN: Skin is warm and dry. No rash noted. Not diaphoretic. No erythema. No pallor. MUSCULOSKELETAL: Normal range of motion. No tenderness.  No cyanosis,  clubbing, or edema.  2+ distal pulses. NEUROLOGIC: Alert and oriented to person, place, and time. Normal reflexes, muscle tone coordination.  PSYCHIATRIC: Normal mood and affect. Normal behavior. Normal judgment and thought content. CARDIOVASCULAR: Normal heart rate noted, regular rhythm RESPIRATORY: Clear to auscultation bilaterally. Effort and breath sounds normal, no problems with respiration noted. BREASTS: Symmetric in size. No masses, tenderness, skin changes, nipple drainage, or lymphadenopathy bilaterally.  ABDOMEN: Soft, no distention noted.  No tenderness, rebound or guarding.  PELVIC: Normal appearing external genitalia and urethral meatus; normal appearing vaginal mucosa and cervix.  No abnormal discharge noted.  Pap smear obtained.contact bleeding .   Normal uterine size, no other palpable masses, no uterine or adnexal tenderness.  .   Assessment and Plan:  Annual [facility] GYN  Exam  Pap:Will follow up results of pap smear and manage accordingly. Mammogram : n/a  Labs: none Refills: none  Referral: none  Routine preventative health maintenance measures emphasized. Please refer to After Visit Summary for other counseling recommendations.      [name], CNM [facility]  [facility],  [facility]

## 2023-06-22 LAB — CYTOLOGY - PAP
Comment: NEGATIVE
High risk HPV: POSITIVE — AB

## 2023-06-28 ENCOUNTER — Telehealth: Admitting: Psychiatry

## 2023-06-28 ENCOUNTER — Encounter: Payer: Self-pay | Admitting: Obstetrics

## 2023-06-28 ENCOUNTER — Encounter: Payer: Self-pay | Admitting: Emergency Medicine

## 2023-06-28 ENCOUNTER — Ambulatory Visit
Admission: EM | Admit: 2023-06-28 | Discharge: 2023-06-28 | Disposition: A | Discharge status: Home / Self Care | Attending: Emergency Medicine | Admitting: Emergency Medicine

## 2023-06-28 DIAGNOSIS — J020 Streptococcal pharyngitis: Secondary | ICD-10-CM | POA: Diagnosis present

## 2023-06-28 LAB — SARS CORONAVIRUS 2 BY RT PCR: SARS Coronavirus 2 by RT PCR: NEGATIVE

## 2023-06-28 LAB — GROUP A STREP BY PCR: Group A Strep by PCR: DETECTED — AB

## 2023-06-28 MED ORDER — CEFDINIR 300 MG PO CAPS
300.0000 mg | ORAL_CAPSULE | Freq: Two times a day (BID) | ORAL | 0 refills | Status: AC
Start: 2023-06-28 — End: 2023-07-08

## 2023-06-28 NOTE — ED Provider Notes (Signed)
 [facility]    CSN: [id number] Arrival date & time: 06/28/23  1010      History   Chief Complaint Chief Complaint  Patient presents with   Sore Throat    HPI Ann Deleon is a 28 y.o. female.   HPI  28 year old female with past medical history significant for asthma, anxiety, GERD, BPPV, PTSD, and postconcussion syndrome presents for evaluation of respiratory symptoms that began 4 days ago that consist of postnasal drip, sore throat, and nosebleeds.  Starting last night she began to have some green and bloody nasal discharge as well as complaining of ear pain.  She has an infrequent, nonproductive cough.  She denies fever, shortness breath, wheezing, or known sick contacts.  Patient does work at a daycare.  Past Medical History:  Diagnosis Date   Anxiety    Asthma    GERD (gastroesophageal reflux disease)     Patient Active Problem List   Diagnosis Date Noted   Left ear pain 12/06/2022   Nonspecific syndrome suggestive of viral illness 12/06/2022   Sore throat 12/06/2022   PTSD (post-traumatic stress disorder) 11/18/2022   History of depression 11/18/2022   High risk medication use 11/18/2022   LGSIL on Pap smear of cervix 12/22/2021   Dizziness 06/29/2018   Benign paroxysmal positional vertigo 05/30/2018   Post concussion syndrome 05/30/2018   Recurrent major depressive disorder, in full remission (HCC) 05/13/2018   GAD (generalized anxiety disorder) 12/16/2016   Obesity (BMI 30.0-34.9) 12/16/2016   History of concussion 05/05/2016   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   Allergic rhinitis due to pollen 04/12/2015   Asthma, mild persistent 07/06/2014   Dermatitis, eczematoid 07/06/2014   Anxiety 07/06/2014    Past Surgical History:  Procedure Laterality Date   CESAREAN SECTION N/A 11/03/2021   Procedure: CESAREAN SECTION;  Surgeon: Teresa Fender, MD;  Location: [facility] ORS;  Service: Obstetrics;  Laterality: N/A;   MANDIBLE FRACTURE SURGERY      MANDIBLE SURGERY      OB History     Gravida  2   Para  1   Term  1   Preterm  0   AB  0   Living  1      SAB  0   IAB  0   Ectopic  0   Multiple  0   Live Births  1            Home Medications    Prior to Admission medications   Medication Sig Start Date End Date Taking? Authorizing Provider  cefdinir (OMNICEF) 300 MG capsule Take 1 capsule (300 mg total) by mouth 2 (two) times daily for 10 days. 06/28/23 07/08/23 Yes Kent Pear, NP  albuterol  (VENTOLIN  HFA) 108 (90 Base) MCG/ACT inhaler Inhale 1-2 puffs into the lungs every 6 (six) hours as needed for wheezing or shortness of breath. 10/23/22   Mayer, Jodi R, NP  busPIRone  (BUSPAR ) 5 MG tablet Take 1 tablet (5 mg total) by mouth 3 (three) times daily. 02/02/23   Eappen, Saramma, MD  fluticasone  (FLOVENT  HFA) 110 MCG/ACT inhaler as needed. 10/27/22   [provider]  hydrOXYzine  (ATARAX ) 10 MG tablet Take 1-2 tablets (10-20 mg total) by mouth at bedtime as needed. Anxiety, sleep 11/18/22   Eappen, Saramma, MD  valACYclovir  (VALTREX ) 1000 MG tablet Use two tablets (2,000 mg) twice daily x one day for an acute flare 04/13/23   [provider]  venlafaxine  XR (EFFEXOR -XR) 75 MG 24 hr capsule  TAKE 1 CAPSULE(75 MG) BY MOUTH DAILY WITH BREAKFAST 02/05/23   Eappen, Saramma, MD    Family History Family History  Problem Relation Age of Onset   Hypertension Mother    Depression Mother    Anxiety disorder Mother    Ovarian cysts Mother    Fibroids Mother    Alcohol abuse Father    Hypertension Father    Diabetes Father    Nephrolithiasis Father    Depression Father    Depression Sister    Breast cancer Neg Hx    Ovarian cancer Neg Hx    Colon cancer Neg Hx     Social History Social History   Tobacco Use   Smoking status: Never   Smokeless tobacco: Never  Vaping Use   Vaping status: Never Used  Substance Use Topics   Alcohol use: Yes    Comment: Social rarely   Drug use: No      Allergies   Patient has no known allergies.   Review of Systems Review of Systems  Constitutional:  Negative for fever.  HENT:  Positive for ear pain, postnasal drip, rhinorrhea and sore throat. Negative for congestion.   Respiratory:  Positive for cough. Negative for shortness of breath and wheezing.      Physical Exam Triage Vital Signs ED Triage Vitals  Encounter Vitals Group     BP      Systolic BP Percentile      Diastolic BP Percentile      Pulse      Resp      Temp      Temp src      SpO2      Weight      Height      Head Circumference      Peak Flow      Pain Score      Pain Loc      Pain Education      Exclude from Growth Chart    No data found.  Updated Vital Signs BP 137/82 (BP Location: Right Wrist)   Pulse 77   Temp 98.4 F (36.9 C) (Oral)   Resp 16   LMP 05/12/2023 (Exact Date)   SpO2 99%   Visual Acuity Right Eye Distance:   Left Eye Distance:   Bilateral Distance:    Right Eye Near:   Left Eye Near:    Bilateral Near:     Physical Exam Vitals and nursing note reviewed.  Constitutional:      Appearance: Normal appearance. She is not ill-appearing.  HENT:     Head: Normocephalic and atraumatic.     Right Ear: Tympanic membrane, ear canal and external ear normal. There is no impacted cerumen.     Left Ear: Tympanic membrane, ear canal and external ear normal. There is no impacted cerumen.     Nose: Congestion and rhinorrhea present.     Comments: Patient mucosa is erythematous, edematous, with clear discharge in both nares.    Mouth/Throat:     Mouth: Mucous membranes are moist.     Pharynx: Oropharynx is clear. Posterior oropharyngeal erythema present. No oropharyngeal exudate.     Comments: Soft palate and tonsillar pillars are erythematous and tonsillar pillars are 2+ edematous.  No visible exudate. Neck:     Comments: Bilateral anterior, tender, cervical lymphadenopathy present. Cardiovascular:     Rate and Rhythm: Normal  rate and regular rhythm.     Pulses: Normal pulses.     Heart sounds:  Normal heart sounds. No murmur heard.    No friction rub. No gallop.  Pulmonary:     Effort: Pulmonary effort is normal.     Breath sounds: Normal breath sounds. No wheezing, rhonchi or rales.  Musculoskeletal:     Cervical back: Normal range of motion and neck supple. Tenderness present.  Lymphadenopathy:     Cervical: Cervical adenopathy present.  Skin:    General: Skin is warm and dry.     Capillary Refill: Capillary refill takes less than 2 seconds.     Findings: No rash.  Neurological:     General: No focal deficit present.     Mental Status: She is alert and oriented to person, place, and time.      UC Treatments / Results  Labs (all labs ordered are listed, but only abnormal results are displayed) Labs Reviewed  GROUP A STREP BY PCR - Abnormal; Notable for the following components:      Result Value   Group A Strep by PCR DETECTED (*)    All other components within normal limits  SARS CORONAVIRUS 2 BY RT PCR    EKG   Radiology No results found.  Procedures Procedures (including critical care time)  Medications Ordered in UC Medications - No data to display  Initial Impression / Assessment and Plan / UC Course  I have reviewed the triage vital signs and the nursing notes.  Pertinent labs & imaging results that were available during my care of the patient were reviewed by me and considered in my medical decision making (see chart for details).   Patient is a nontoxic-appearing 79 old female presenting for evaluation of 4 days worth of URI symptoms as outlined in HPI above.  Her most significant complaint is bloody nose and postnasal drip and a sore throat.  I do not appreciate any bloody discharge in her nares though her nasal mucosa is edematous and erythematous.  Her discharge that is present is clear.  She also has edema and erythema of her tonsillar pillars and erythema of her soft palate.   Differential diagnose include COVID, influenza, strep pharyngitis, viral respiratory illness.  Given that she is on day 4 symptoms I will not test her for influenza at this time but I will order a COVID and strep PCR.  Strep PCR is positive.  COVID PCR is negative.  I will discharge patient with a diagnosis of strep pharyngitis.  The patient is currently pregnant so I will discharge her home on cefdinir 300 mg twice daily for 10 days for treatment of strep pharyngitis.  She may use salt water gargles to help soothe her throat along with over-the-counter Tylenol .  I will also give her a list of over-the-counter medications that she can use to help with her nasal symptoms.   Final Clinical Impressions(s) / UC Diagnoses   Final diagnoses:  Strep pharyngitis     Discharge Instructions      Take the cefdinir twice daily for 10 days for treatment of your strep throat.  Gargle with warm salt water 2-3 times a day to soothe your throat, aid in pain relief, and aid in healing.  Take over-the-counter Tylenol  according to the package instructions as needed for pain.  You can also use Chloraseptic or Sucrets lozenges, 1 lozenge every 2 hours as needed for throat pain.  If you develop any new or worsening symptoms return for reevaluation.    ED Prescriptions     Medication Sig Dispense Auth. Provider  cefdinir (OMNICEF) 300 MG capsule Take 1 capsule (300 mg total) by mouth 2 (two) times daily for 10 days. 20 capsule Kent Pear, NP      PDMP not reviewed this encounter.   Kent Pear, NP 06/28/23 1118

## 2023-06-28 NOTE — Discharge Instructions (Signed)
 Take the cefdinir twice daily for 10 days for treatment of your strep throat.  Gargle with warm salt water 2-3 times a day to soothe your throat, aid in pain relief, and aid in healing.  Take over-the-counter Tylenol  according to the package instructions as needed for pain.  You can also use Chloraseptic or Sucrets lozenges, 1 lozenge every 2 hours as needed for throat pain.  If you develop any new or worsening symptoms return for reevaluation.

## 2023-06-28 NOTE — ED Triage Notes (Signed)
 Sx since Thursday Post nasal drip Nose bleeds Sore throat

## 2023-07-02 ENCOUNTER — Ambulatory Visit (INDEPENDENT_AMBULATORY_CARE_PROVIDER_SITE_OTHER)

## 2023-07-02 DIAGNOSIS — Z3689 Encounter for other specified antenatal screening: Secondary | ICD-10-CM | POA: Diagnosis not present

## 2023-07-02 DIAGNOSIS — Z3480 Encounter for supervision of other normal pregnancy, unspecified trimester: Secondary | ICD-10-CM | POA: Insufficient documentation

## 2023-07-02 DIAGNOSIS — O0990 Supervision of high risk pregnancy, unspecified, unspecified trimester: Secondary | ICD-10-CM | POA: Insufficient documentation

## 2023-07-02 NOTE — Progress Notes (Signed)
 New OB Intake  I connected with  Ann Deleon on 07/02/23 at 11:15 AM EDT by telephone and verified that I am speaking with the correct person using two identifiers. Nurse is located at [facility] and pt is located at work on her lunch break.  I discussed the limitations, risks, security and privacy concerns of performing an evaluation and management service by telephone and the availability of in person appointments. I also discussed with the patient that there may be a patient responsible charge related to this service. The patient expressed understanding and agreed to proceed.  I explained I am completing New OB Intake today. We discussed her EDD of 02/16/24 that is based on LMP of 05/12/23. Pt is G2/P1. I reviewed her allergies, medications, Medical/Surgical/OB history, and appropriate screenings. There are no cats in the home. Based on history, this is a/an pregnancy uncomplicated . Her obstetrical history is significant for N/A.  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  Anatomy US  Explained first scheduled US  will be 07/06/23. Anatomy US  will be done at 20 weeks of  gestational age.  Labs Discussed genetic screening with patient. Patient desires genetic testing to be drawn at new OB visit. Discussed possible labs to be drawn at new OB appointment.  COVID Vaccine Patient has had 1 COVID vaccine.   Social Determinants of Health Food Insecurity: denies food insecurity Transportation: Patient denies transportation needs. Childcare: Discussed no children allowed at ultrasound appointments.   First visit review I reviewed new OB appt with pt. I explained she will have blood work and pap smear/pelvic exam if indicated. Explained pt will be seen by [name], CNM at first visit; encounter routed to appropriate provider.   Higinio Love, [facility] 07/02/2023  11:39 AM

## 2023-07-02 NOTE — Patient Instructions (Signed)
 First Trimester of Pregnancy  The first trimester of pregnancy starts on the first day of your last monthly period until the end of week 13. This is months 1 through 3 of pregnancy. A week after a sperm fertilizes an egg, the egg will implant into the wall of the uterus and begin to develop into a baby. Body changes during your first trimester Your body goes through many changes during pregnancy. The changes usually return to normal after your baby is born. Physical changes Your breasts may grow larger and may hurt. The area around your nipples may get darker. Your periods will stop. Your hair and nails may grow faster. You may pee more often. Health changes You may tire easily. Your gums may bleed and may be sensitive when you brush and floss. You may not feel hungry. You may have heartburn. You may throw up or feel like you may throw up. You may want to eat some foods, but not others. You may have headaches. You may have trouble pooping (constipation). Other changes Your emotions may change from day to day. You may have more dreams. Follow these instructions at home: Medicines Talk to your health care provider if you're taking medicines. Ask if the medicines are safe to take during pregnancy. Your provider may change the medicines that you take. Do not take any medicines unless told to by your provider. Take a prenatal vitamin that has at least 600 micrograms (mcg) of folic acid. Do not use herbal medicines, illegal substances, or medicines that are not approved by your provider. Eating and drinking While you're pregnant your body needs extra food for your growing baby. Talk with your provider about what to eat while pregnant. Activity Most women are able to exercise during pregnancy. Exercises may need to change as your pregnancy goes on. Talk to your provider about your activities and exercise routines. Relieving pain and discomfort Wear a good, supportive bra if your breasts  hurt. Rest with your legs raised if you have leg cramps or low back pain. Safety Wear your seatbelt at all times when you're in a car. Talk to your provider if someone hits you, hurts you, or yells at you. Talk with your provider if you're feeling sad or have thoughts of hurting yourself. Lifestyle Certain things can be harmful while you're pregnant. Follow these rules: Do not use hot tubs, steam rooms, or saunas. Do not douche. Do not use tampons or scented pads. Do not drink alcohol,smoke, vape, or use products with nicotine or tobacco in them. If you need help quitting, talk with your provider. Avoid cat litter boxes and soil used by cats. These things carry germs that can cause harm to your pregnancy and your baby. General instructions Keep all follow-up visits. It helps you and your unborn baby stay as healthy as possible. Write down your questions. Take them to your visits. Your provider will: Talk with you about your overall health. Give you advice or refer you to specialists who can help with different needs, including: Prenatal education classes. Mental health and counseling. Foods and healthy eating. Ask for help if you need help with food. Call your dentist and ask to be seen. Brush your teeth with a soft toothbrush. Floss gently. Where to find more information American Pregnancy Association: americanpregnancy.org [employer]st[employer]ians and Gynecologists: acog.org Office on [employer] Health: TravelLesson.[employer] Contact a health care provider if: You feel dizzy, faint, or have a fever. You vomit or have watery poop (diarrhea) for 2  days or more. You have abnormal discharge or bleeding from your vagina. You have pain when you pee or your pee smells bad. You have cramps, pain, or pressure in your belly area. Get help right away if: You have trouble breathing or chest pain. You have any kind of injury, such as from a fall or a car crash. These symptoms may be an  emergency. Get help right away. Call 911. Do not wait to see if the symptoms will go away. Do not drive yourself to the hospital. This information is not intended to replace advice given to you by your health care provider. Make sure you discuss any questions you have with your health care provider. Document Revised: 11/05/2022 Document Reviewed: 06/05/2022 Elsevier Patient Education  2024 Elsevier Inc.   Common Medications Safe in Pregnancy  Acne:      Constipation:  Benzoyl Peroxide     Colace  Clindamycin      Dulcolax Suppository  Topica Erythromycin     Fibercon  Salicylic Acid      Metamucil         Miralax AVOID:        Senakot   Accutane    Cough:  Retin-A       Cough Drops  Tetracycline      Phenergan w/ Codeine if Rx  Minocycline      Robitussin (Plain & DM)  Antibiotics:     Crabs/Lice:  Ceclor       RID  Cephalosporins    AVOID:  E-Mycins      Kwell  Keflex  Macrobid/Macrodantin   Diarrhea:  Penicillin      Kao-Pectate  Zithromax      Imodium AD         PUSH FLUIDS AVOID:       Cipro     Fever:  Tetracycline      Tylenol (Regular or Extra  Minocycline       Strength)  Levaquin      Extra Strength-Do not          Exceed 8 tabs/24 hrs Caffeine:        200mg /day (equiv. To 1 cup of coffee or  approx. 3 12 oz sodas)         Gas: Cold/Hayfever:       Gas-X  Benadryl      Mylicon  Claritin       Phazyme  **Claritin-D        Chlor-Trimeton    Headaches:  Dimetapp      ASA-Free Excedrin  Drixoral-Non-Drowsy     Cold Compress  Mucinex (Guaifenasin)     Tylenol (Regular or Extra  Sudafed/Sudafed-12 Hour     Strength)  **Sudafed PE Pseudoephedrine   Tylenol Cold & Sinus     Vicks Vapor Rub  Zyrtec  **AVOID if Problems With Blood Pressure         Heartburn: Avoid lying down for at least 1 hour after meals  Aciphex      Maalox     Rash:  Milk of Magnesia     Benadryl    Mylanta       1% Hydrocortisone Cream  Pepcid  Pepcid Complete   Sleep  Aids:  Prevacid      Ambien   Prilosec       Benadryl  Rolaids       Chamomile Tea  Tums (Limit 4/day)     Unisom  Tylenol PM         Warm milk-add vanilla or  Hemorrhoids:       Sugar for taste  Anusol/Anusol H.C.  (RX: Analapram 2.5%)  Sugar Substitutes:  Hydrocortisone OTC     Ok in moderation  Preparation H      Tucks        Vaseline lotion applied to tissue with wiping    Herpes:     Throat:  Acyclovir      Oragel  Famvir  Valtrex     Vaccines:         Flu Shot Leg Cramps:       *Gardasil  Benadryl      Hepatitis A         Hepatitis B Nasal Spray:       Pneumovax  Saline Nasal Spray     Polio Booster         Tetanus Nausea:       Tuberculosis test or PPD  Vitamin B6 25 mg TID   AVOID:    Dramamine      *Gardasil  Emetrol       Live Poliovirus  Ginger Root 250 mg QID    MMR (measles, mumps &  High Complex Carbs @ Bedtime    rebella)  Sea Bands-Accupressure    Varicella (Chickenpox)  Unisom 1/2 tab TID     *No known complications           If received before Pain:         Known pregnancy;   Darvocet       Resume series after  Lortab        Delivery  Percocet    Yeast:   Tramadol      Femstat  Tylenol 3      Gyne-lotrimin  Ultram       Monistat  Vicodin           MISC:         All Sunscreens           Hair Coloring/highlights          Insect Repellant's          (Including DEET)         Mystic Tans   Commonly Asked Questions During Pregnancy   Cats: A parasite can be excreted in cat feces.  To avoid exposure you need to have another person empty the little box.  If you must empty the litter box you will need to wear gloves.  Wash your hands after handling your cat.  This parasite can also be found in raw or undercooked meat so this should also be avoided.  Colds, Sore Throats, Flu: Please check your medication sheet to see what you can take for symptoms.  If your symptoms are unrelieved by these medications please call the office.  Dental Work: Most  any dental work Agricultural consultant recommends is permitted.  X-rays should only be taken during the first trimester if absolutely necessary.  Your abdomen should be shielded with a lead apron during all x-rays.  Please notify your provider prior to receiving any x-rays.  Novocaine is fine; gas is not recommended.  If your dentist requires a note from [place] prior to dental work please call the office and we will provide one for you.  Exercise: Exercise is an important part of staying healthy during your pregnancy.  You may continue most exercises you were accustomed to prior to pregnancy.  Later in your pregnancy you will most likely notice you have difficulty with activities requiring balance like riding a bicycle.  It is important that you listen to your body and avoid activities that put you at a higher risk of falling.  Adequate rest and staying well hydrated are a must!  If you have questions about the safety of specific activities ask your provider.    Exposure to Children with illness: Try to avoid obvious exposure; report any symptoms to [place] when noted,  If you have chicken pos, red measles or mumps, you should be immune to these diseases.   Please do not take any vaccines while pregnant unless you have checked with your OB provider.  Fetal Movement: After 28 weeks we recommend you do "kick counts" twice daily.  Lie or sit down in a calm quiet environment and count your baby movements "kicks".  You should feel your baby at least 10 times per hour.  If you have not felt 10 kicks within the first hour get up, walk around and have something sweet to eat or drink then repeat for an additional hour.  If count remains less than 10 per hour notify your provider.  Fumigating: Follow your pest control agent's advice as to how long to stay out of your home.  Ventilate the area well before re-entering.  Hemorrhoids:   Most over-the-counter preparations can be used during pregnancy.  Check your medication to see what is  safe to use.  It is important to use a stool softener or fiber in your diet and to drink lots of liquids.  If hemorrhoids seem to be getting worse please call the office.   Hot Tubs:  Hot tubs Jacuzzis and saunas are not recommended while pregnant.  These increase your internal body temperature and should be avoided.  Intercourse:  Sexual intercourse is safe during pregnancy as long as you are comfortable, unless otherwise advised by your provider.  Spotting may occur after intercourse; report any bright red bleeding that is heavier than spotting.  Labor:  If you know that you are in labor, please go to the hospital.  If you are unsure, please call the office and let us help you decide what to do.  Lifting, straining, etc:  If your job requires heavy lifting or straining please check with your provider for any limitations.  Generally, you should not lift items heavier than that you can lift simply with your hands and arms (no back muscles)  Painting:  Paint fumes do not harm your pregnancy, but may make you ill and should be avoided if possible.  Latex or water based paints have less odor than oils.  Use adequate ventilation while painting.  Permanents & Hair Color:  Chemicals in hair dyes are not recommended as they cause increase hair dryness which can increase hair loss during pregnancy.  " Highlighting" and permanents are allowed.  Dye may be absorbed differently and permanents may not hold as well during pregnancy.  Sunbathing:  Use a sunscreen, as skin burns easily during pregnancy.  Drink plenty of fluids; avoid over heating.  Tanning Beds:  Because their possible side effects are still unknown, tanning beds are not recommended.  Ultrasound Scans:  Routine ultrasounds are performed at approximately 20 weeks.  You will be able to see your baby's general anatomy an if you would like to know the gender this can usually be determined as well.  If it is questionable when you conceived you may  also  receive an ultrasound early in your pregnancy for dating purposes.  Otherwise ultrasound exams are not routinely performed unless there is a medical necessity.  Although you can request a scan we ask that you pay for it when conducted because insurance does not cover " patient request" scans.  Work: If your pregnancy proceeds without complications you may work until your due date, unless your physician or employer advises otherwise.  Round Ligament Pain/Pelvic Discomfort:  Sharp, shooting pains not associated with bleeding are fairly common, usually occurring in the second trimester of pregnancy.  They tend to be worse when standing up or when you remain standing for long periods of time.  These are the result of pressure of certain pelvic ligaments called "round ligaments".  Rest, Tylenol and heat seem to be the most effective relief.  As the womb and fetus grow, they rise out of the pelvis and the discomfort improves.  Please notify the office if your pain seems different than that described.  It may represent a more serious condition.

## 2023-07-06 ENCOUNTER — Ambulatory Visit
Admission: RE | Admit: 2023-07-06 | Discharge: 2023-07-06 | Disposition: A | Discharge status: Home / Self Care | Source: Ambulatory Visit | Attending: Certified Nurse Midwife | Admitting: Certified Nurse Midwife

## 2023-07-06 ENCOUNTER — Other Ambulatory Visit: Payer: Self-pay | Admitting: Certified Nurse Midwife

## 2023-07-06 DIAGNOSIS — Z3687 Encounter for antenatal screening for uncertain dates: Secondary | ICD-10-CM | POA: Diagnosis present

## 2023-07-06 DIAGNOSIS — Z124 Encounter for screening for malignant neoplasm of cervix: Secondary | ICD-10-CM

## 2023-07-06 DIAGNOSIS — N912 Amenorrhea, unspecified: Secondary | ICD-10-CM

## 2023-07-06 DIAGNOSIS — Z8742 Personal history of other diseases of the female genital tract: Secondary | ICD-10-CM

## 2023-07-06 DIAGNOSIS — Z01419 Encounter for gynecological examination (general) (routine) without abnormal findings: Secondary | ICD-10-CM

## 2023-07-07 ENCOUNTER — Encounter: Payer: Self-pay | Admitting: Certified Nurse Midwife

## 2023-07-14 ENCOUNTER — Encounter: Payer: Self-pay | Admitting: Certified Nurse Midwife

## 2023-07-27 ENCOUNTER — Ambulatory Visit (INDEPENDENT_AMBULATORY_CARE_PROVIDER_SITE_OTHER): Admitting: Professional Counselor

## 2023-07-27 DIAGNOSIS — F411 Generalized anxiety disorder: Secondary | ICD-10-CM | POA: Diagnosis not present

## 2023-07-27 NOTE — Progress Notes (Signed)
  THERAPIST PROGRESS NOTE  Session Time: 11:00 AM - 11:53 AM   Participation Level: Active  Behavioral Response: Casual, Alert, Anxious  Type of Therapy: Individual Therapy  Treatment Goals addressed: Active Anxiety  LTG: "Maybe more trust. Maybe stop over-thinking."                Start:  06/03/23    Expected End:  06/01/24      STG: "I think it's letting (daughter) go places without me or my husband and knowing she will be fine." To reduce sxs of anxiety AEB using grounding techniques and restructuring maladaptive patterns of thinking over the next 90 days.    STG: "I think I look too far into situations." To improve mindfulness AEB utilizing mindfulness exercises and focusing on what's within her control over the next 90 days.    ProgressTowards Goals: Progressing  Interventions: CBT, Motivational Interviewing, and Supportive  Summary: Ann Deleon is a 28 y.o. female who presents with a history of anxiety, depression, and trauma. She appeared alert and oriented x5. She reported she was confirmed [redacted] weeks pregnant. She discussed fears she has around having a second child. She also discussed interactions with extended family.  Therapist Response: Conducted session with [name]. Began session with check-in/update since previous session. Utilized empathetic and reflective  listening. Explored [name]'s thoughts/feelings around having another baby. Used Socratic questioning to challenge negative thinking. Provided some parenting tips such as teaching emotional regulation skills to daughter, "name it to tame it," and positive reinforcement. Used open-ended questions to facilitate discussion around extended family and summarized [name]'s thoughts/feelings. Scheduled additional appointment and concluded session.   Suicidal/Homicidal: No  Plan: Return again in 2 weeks.  Diagnosis: GAD (generalized anxiety disorder)  Collaboration of Care: Medication Management AEB chart  review  Patient/Guardian was advised Release of Information must be obtained prior to any record release in order to collaborate their care with an outside provider. Patient/Guardian was advised if they have not already done so to contact the registration department to sign all necessary forms in order for us  to release information regarding their care.   Consent: Patient/Guardian gives verbal consent for treatment and assignment of benefits for services provided during this visit. Patient/Guardian expressed understanding and agreed to proceed.   Len Quale, [facility] 07/27/2023

## 2023-08-06 ENCOUNTER — Other Ambulatory Visit (HOSPITAL_COMMUNITY)
Admission: RE | Admit: 2023-08-06 | Discharge: 2023-08-06 | Disposition: A | Discharge status: Home / Self Care | Source: Ambulatory Visit | Attending: Certified Nurse Midwife | Admitting: Certified Nurse Midwife

## 2023-08-06 ENCOUNTER — Ambulatory Visit (INDEPENDENT_AMBULATORY_CARE_PROVIDER_SITE_OTHER): Admitting: Certified Nurse Midwife

## 2023-08-06 VITALS — BP 128/83 | HR 80 | Wt 263.7 lb

## 2023-08-06 DIAGNOSIS — T7589XA Other specified effects of external causes, initial encounter: Secondary | ICD-10-CM

## 2023-08-06 DIAGNOSIS — Z113 Encounter for screening for infections with a predominantly sexual mode of transmission: Secondary | ICD-10-CM

## 2023-08-06 DIAGNOSIS — Z3481 Encounter for supervision of other normal pregnancy, first trimester: Secondary | ICD-10-CM

## 2023-08-06 DIAGNOSIS — Z3A11 11 weeks gestation of pregnancy: Secondary | ICD-10-CM

## 2023-08-06 DIAGNOSIS — Z0283 Encounter for blood-alcohol and blood-drug test: Secondary | ICD-10-CM

## 2023-08-06 DIAGNOSIS — E669 Obesity, unspecified: Secondary | ICD-10-CM | POA: Insufficient documentation

## 2023-08-06 DIAGNOSIS — Z0184 Encounter for antibody response examination: Secondary | ICD-10-CM

## 2023-08-06 DIAGNOSIS — Z1379 Encounter for other screening for genetic and chromosomal anomalies: Secondary | ICD-10-CM

## 2023-08-06 DIAGNOSIS — Z130 Encounter for screening for diseases of the blood and blood-forming organs and certain disorders involving the immune mechanism: Secondary | ICD-10-CM

## 2023-08-06 MED ORDER — ONDANSETRON 4 MG PO TBDP: 4.0000 mg | ORAL_TABLET | Freq: Three times a day (TID) | ORAL | 1 refills | Status: DC | PRN

## 2023-08-06 MED ORDER — ASPIRIN 81 MG PO TBEC: 162.0000 mg | DELAYED_RELEASE_TABLET | Freq: Every day | ORAL | 12 refills | Status: DC

## 2023-08-06 NOTE — Progress Notes (Signed)
 NEW OB HISTORY AND PHYSICAL  SUBJECTIVE:       Ann Deleon is a 28 y.o. G10P1001 female, Patient's last menstrual period was 05/12/2023 (exact date)., Estimated Date of Delivery: 02/16/24, [redacted]w[redacted]d, presents today for establishment of Prenatal Care. She has no unusual complaints .  Relationship:Married Living with spouse and child Work: full time teacher Exercise:  walking 30 min daily  Alcohol/drugs/smoke/vape:  none    Gynecologic History Patient's last menstrual period was 05/12/2023 (exact date). Normal Contraception: none Last Pap: 06/16/2023. Results were: LSIL/+ HR HPV  Obstetric History OB History  Gravida Para Term Preterm AB Living  2 1 1  0 0 1  SAB IAB Ectopic Multiple Live Births  0 0 0 0 1    # Outcome Date GA Lbr Len/2nd Weight Sex Type Anes PTL Lv  2 Current           1 Term 11/03/21 [redacted]w[redacted]d  6 lb 15.1 oz (3.15 kg) F CS-LTranv Spinal  LIV    Past Medical History:  Diagnosis Date   Anxiety    Asthma    GERD (gastroesophageal reflux disease)     Past Surgical History:  Procedure Laterality Date   CESAREAN SECTION N/A 11/03/2021   Procedure: CESAREAN SECTION;  Surgeon: Teresa Fender, MD;  Location: [facility] ORS;  Service: Obstetrics;  Laterality: N/A;   MANDIBLE FRACTURE SURGERY     MANDIBLE SURGERY     WISDOM TOOTH EXTRACTION      Current Outpatient Medications on File Prior to Visit  Medication Sig Dispense Refill   albuterol  (VENTOLIN  HFA) 108 (90 Base) MCG/ACT inhaler Inhale 1-2 puffs into the lungs every 6 (six) hours as needed for wheezing or shortness of breath. 1 each 0   fluticasone  (FLOVENT  HFA) 110 MCG/ACT inhaler as needed.     Prenatal Vit-Fe Fumarate-FA (PRENATAL PO) Take by mouth.     valACYclovir  (VALTREX ) 1000 MG tablet as needed.     No current facility-administered medications on file prior to visit.    No Known Allergies  Social History   Socioeconomic History   Marital status: Married    Spouse name: Van Gelinas   Number of children: 1    Years of education: Not on file   Highest education level: Associate degree: occupational, Scientist, product/process development, or vocational program  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  Tobacco Use   Smoking status: Never   Smokeless tobacco: Never  Vaping Use   Vaping status: Never Used  Substance and Sexual Activity   Alcohol use: Not Currently   Drug use: No   Sexual activity: Yes    Partners: Male    Birth control/protection: None  Other Topics Concern   Not on file  Social History Narrative   Not on file   Social Drivers of Health   Financial Resource Strain: Low Risk  (06/30/2023)   Overall Financial Resource Strain (CARDIA)    Difficulty of Paying Living Expenses: Not hard at all  Food Insecurity: No Food Insecurity (06/30/2023)   Hunger Vital Sign    Worried About Running Out of Food in the Last Year: Never true    Ran Out of Food in the Last Year: Never true  Transportation Needs: No Transportation Needs (06/30/2023)   PRAPARE - Administrator, Civil Service (Medical): No    Lack of Transportation (Non-Medical): No  Physical Activity: Sufficiently Active (06/30/2023)   Exercise Vital Sign    Days of Exercise per Week: 5 days  Minutes of Exercise per Session: 30 min  Stress: Stress Concern Present (06/30/2023)   [employer] of Occupational Health - Occupational Stress Questionnaire    Feeling of Stress : To some extent  Social Connections: Moderately Integrated (06/30/2023)   Social Connection and Isolation Panel    Frequency of Communication with Friends and Family: More than three times a week    Frequency of Social Gatherings with Friends and Family: Twice a week    Attends Religious Services: More than 4 times per year    Active Member of [employer] or Organizations: No    Attends Banker Meetings: Never    Marital Status: Married  Catering manager Violence: Not At Risk (07/02/2023)   Humiliation, Afraid, Rape, and Kick questionnaire    Fear of  Current or Ex-Partner: No    Emotionally Abused: No    Physically Abused: No    Sexually Abused: No    Family History  Problem Relation Age of Onset   Hypertension Mother    Depression Mother    Anxiety disorder Mother    Ovarian cysts Mother    Fibroids Mother    Alcohol abuse Father    Hypertension Father    Diabetes Father    Nephrolithiasis Father    Depression Father    Depression Sister    Breast cancer Neg Hx    Ovarian cancer Neg Hx    Colon cancer Neg Hx     The following portions of the patient's history were reviewed and updated as appropriate: allergies, current medications, past OB history, past medical history, past surgical history, past family history, past social history, and problem list.    OBJECTIVE: Initial Physical Exam (New OB)  GENERAL APPEARANCE: alert, well appearing, in no apparent distress, oriented to person, place and time, overweight HEAD: normocephalic, atraumatic MOUTH: mucous membranes moist, pharynx normal without lesions THYROID : no thyromegaly or masses present BREASTS: no masses noted, no significant tenderness, no palpable axillary nodes, no skin changes LUNGS: clear to auscultation, no wheezes, rales or rhonchi, symmetric air entry HEART: regular rate and rhythm, no murmurs ABDOMEN: soft, nontender, nondistended, no abnormal masses, no epigastric pain EXTREMITIES: no redness or tenderness in the calves or thighs SKIN: normal coloration and turgor, no rashes LYMPH NODES: no adenopathy palpable NEUROLOGIC: alert, oriented, normal speech, no focal findings or movement disorder noted  PELVIC EXAM deferred pt not indicated   ASSESSMENT: Normal pregnancy  PLAN: Prenatal care See ordersNew OB counseling: The patient has been given an overview regarding routine prenatal care. Recommendations regarding diet, weight gain, and exercise in pregnancy were given. Prenatal testing, optional genetic testing, carrier screening, and  ultrasound use in pregnancy were reviewed.  Benefits of Breast Feeding were discussed. The patient is encouraged to consider nursing her baby post partum. FHT not able to ascultate, bedside u/s confirmed cardiac activity seen. Zofran  orderred for nausea.

## 2023-08-06 NOTE — Patient Instructions (Signed)
 Prenatal Care Prenatal care is health care you get when pregnant. It helps you and your unborn baby stay as healthy as possible. Start prenatal care early in your pregnancy and continue to go to visits during your pregnancy. Prenatal care may be given by a midwife, a family practice doctor, a Publishing rights manager, physician assistant, or a childbirth and pregnancy doctor. What are the benefits of prenatal care? In prenatal care, your health care provider will get to know your medical history. You'll be checked for conditions that might affect you and your baby. Prenatal care will: Lower the risk for problems as your child grows. Lower certain risks for your baby, especially the risk that: Your child may be born early. Your child will have a low weight at birth. What can I expect at the first prenatal care visit? Your first visit will likely be the longest. You should ask to be seen as soon as you know you're pregnant. The first visit is a good time to talk about any questions or concerns. Make a list of questions to ask your provider at your visits. Medical history At your visit, you and your provider will talk about your medical history, including: Your family's medical history and the medical history of the baby's father. Any past pregnancies and long-term (chronic) health conditions. Any surgeries or procedures you have had. All medicines you're taking. Tell them if you're taking herbs or supplements too. Any tobacco, alcohol, or drug use. Other problems that may harm you and your baby. Tell them if: You need food or housing. You have been around chemicals or radiation. Your partner yells at you, hits you, or hurts you. Tests and screenings Your provider will: Do a physical exam, including a pelvic and breast exam. Do tests to check for: Urinary tract infection (UTI). Sexually transmitted infections (STIs). Low iron levels in your blood. This is called anemia. Blood type and certain  proteins on red blood cells called Rh antibodies. Infections and immunity to viruses, such as hepatitis B and rubella. HIV. Ask your provider if you need to be checked for genetic diseases. Tips about staying healthy Your provider will also give you information about how to keep yourself and your baby healthy, including: Nutrition, vitamins, and food safety. Physical activity. How to treat some problems, such as morning sickness. How to avoid infections and substances that may harm your baby. Caring for your teeth. Work and travel. Problems that require you to call your provider. How often will I have prenatal care visits? After your first prenatal care visit, you will have regular visits throughout your pregnancy. You may visit your provider as follows: Up to week 28 of pregnancy: once every 4 weeks. 28-36 weeks: once every 2 weeks. After 36 weeks: every week until delivery. Some people may have more visits. Others may have fewer. It all depends on your health and that of your baby. Keep all prenatal visits. This is one way for you and your baby to stay as healthy as possible. What happens during routine prenatal care visits? Your provider will: Check your weight and blood pressure. Check your baby's heart sounds. Ask questions about your diet, exercise, sleeping patterns, and whether you can feel the baby move. Ask about any pregnancy symptoms you're having and how you're dealing with them. Tell your provider if: You throw up or feel like you may throw up. You have discharge or you bleed from your vagina. You have trouble pooping (constipation). You have swelling, headaches, or trouble  seeing. You are very tired, or you feel sad and anxious all the time. You have discomfort, including back pain or pain in the pelvis. Tell you problems to watch for during your pregnancy, including signs of labor. Measure the height of your uterus in your belly. This is called fundal height. What  tests might I have during prenatal care visits? You may have blood, urine, and imaging tests. These may include: Urine tests to check for blood sugar, protein, or signs of infection. Genetic testing. Ultrasounds to check your baby's growth, development, and well-being. Your baby may also be checked for congenital conditions. Glucose tests to check for gestational diabetes. This is a form of diabetes that a person can get when pregnant. A test to check for group B strep (GBS) infection. What else can I expect during prenatal care visits? Your provider may give you some vaccines. Getting certain vaccines during pregnancy can protect your baby after birth. These may include: A flu shot. Tdap (tetanus, diphtheria, pertussis) vaccine. A COVID-19 vaccine. A RSV vaccine. Later in your pregnancy, your provider may talk to you about: Childbirth and childbirth classes. Breastfeeding and breastfeeding classes. Birth control after your baby is born. Where to find more information Office on Women's Health: TravelLesson.ca American Pregnancy Association: americanpregnancy.org March of Dimes: marchofdimes.org This information is not intended to replace advice given to you by your health care provider. Make sure you discuss any questions you have with your health care provider. Document Revised: 06/08/2022 Document Reviewed: 06/08/2022 Elsevier Patient Education  2024 [employer].

## 2023-08-07 LAB — CBC/D/PLT+RPR+RH+ABO+RUBIGG...
Antibody Screen: NEGATIVE
Basophils Absolute: 0.1 10*3/uL (ref 0.0–0.2)
Basos: 1 %
EOS (ABSOLUTE): 0.1 10*3/uL (ref 0.0–0.4)
Eos: 1 %
HCV Ab: NONREACTIVE
HIV Screen 4th Generation wRfx: NONREACTIVE
Hematocrit: 36.9 % (ref 34.0–46.6)
Hemoglobin: 11.9 g/dL (ref 11.1–15.9)
Hepatitis B Surface Ag: NEGATIVE
Immature Grans (Abs): 0.1 10*3/uL (ref 0.0–0.1)
Immature Granulocytes: 1 %
Lymphocytes Absolute: 2.1 10*3/uL (ref 0.7–3.1)
Lymphs: 20 %
MCH: 27.4 pg (ref 26.6–33.0)
MCHC: 32.2 g/dL (ref 31.5–35.7)
MCV: 85 fL (ref 79–97)
Monocytes Absolute: 0.7 10*3/uL (ref 0.1–0.9)
Monocytes: 6 %
Neutrophils Absolute: 7.6 10*3/uL — ABNORMAL HIGH (ref 1.4–7.0)
Neutrophils: 71 %
Platelets: 267 10*3/uL (ref 150–450)
RBC: 4.34 x10E6/uL (ref 3.77–5.28)
RDW: 13.2 % (ref 11.7–15.4)
RPR Ser Ql: NONREACTIVE
Rh Factor: POSITIVE
Rubella Antibodies, IGG: 3.21 {index} (ref >0.99)
Varicella zoster IgG: REACTIVE
WBC: 10.6 10*3/uL (ref 3.4–10.8)

## 2023-08-07 LAB — URINALYSIS, ROUTINE W REFLEX MICROSCOPIC
Bilirubin, UA: NEGATIVE
Glucose, UA: NEGATIVE
Ketones, UA: NEGATIVE
Nitrite, UA: NEGATIVE
Specific Gravity, UA: 1.029 (ref 1.005–1.030)
Urobilinogen, Ur: 0.2 mg/dL (ref 0.2–1.0)
pH, UA: 5.5 (ref 5.0–7.5)

## 2023-08-07 LAB — MICROSCOPIC EXAMINATION
Casts: NONE SEEN /LPF
Epithelial Cells (non renal): >10 /HPF — AB (ref 0–10)
WBC, UA: NONE SEEN /HPF (ref 0–5)

## 2023-08-07 LAB — HCV INTERPRETATION

## 2023-08-08 LAB — CULTURE, OB URINE

## 2023-08-08 LAB — URINE CULTURE, OB REFLEX

## 2023-08-10 ENCOUNTER — Ambulatory Visit (INDEPENDENT_AMBULATORY_CARE_PROVIDER_SITE_OTHER): Admitting: Professional Counselor

## 2023-08-10 DIAGNOSIS — F411 Generalized anxiety disorder: Secondary | ICD-10-CM | POA: Diagnosis not present

## 2023-08-10 LAB — CERVICOVAGINAL ANCILLARY ONLY
Chlamydia: NEGATIVE
Comment: NEGATIVE
Comment: NORMAL
Neisseria Gonorrhea: NEGATIVE

## 2023-08-10 NOTE — Progress Notes (Signed)
  THERAPIST PROGRESS NOTE  Session Time: 11:03 AM - 11:49 AM   Participation Level: Active  Behavioral Response: Casual, Alert, Anxious  Type of Therapy: Individual Therapy  Treatment Goals addressed:  Active Anxiety  LTG: Maybe more trust. Maybe stop over-thinking.                Start:  06/03/23    Expected End:  06/01/24      STG: I think it's letting (daughter) go places without me or my husband and knowing she will be fine. To reduce sxs of anxiety AEB using grounding techniques and restructuring maladaptive patterns of thinking over the next 90 days.    STG: I think I look too far into situations. To improve mindfulness AEB utilizing mindfulness exercises and focusing on what's within her control over the next 90 days.    ProgressTowards Goals: Progressing  Interventions: CBT, Motivational Interviewing, and Supportive  Summary: Ann Deleon is a 28 y.o. female who presents with a history of anxiety, depression, and trauma. She appeared alert and oriented x5. She stated her husband was promoted to Engineer, structural. She discussed various factors around this situation. [name] was receptive to ways to validate her husband's experience prior to fixing it. She was tearful during this discussion. She shared thoughts/feelings surrounding communication. She actively listened to [name] skill and practiced during session. She was receptive to feedback from Conservation officer, nature.   Therapist Response: Conducted session with [name]. Began session with check-in/update since previous session. Utilized empathetic and reflective listening. Explored [name]'s thoughts/feelings about husband's promotion. Used gentle confrontation about [name] attempts to fix her husband's emotional reaction and encouraged her to validate his experiences. Discussed communication skills and barriers. Explained DEAR MAN skill and had [employer] in session. Offered feedback. Scheduled additional appointment and  concluded session.   Suicidal/Homicidal: No  Plan: Return again in 4 weeks.  Diagnosis: GAD (generalized anxiety disorder)  Collaboration of Care: Medication Management AEB chart review  Patient/Guardian was advised Release of Information must be obtained prior to any record release in order to collaborate their care with an outside provider. Patient/Guardian was advised if they have not already done so to contact the registration department to sign all necessary forms in order for us  to release information regarding their care.   Consent: Patient/Guardian gives verbal consent for treatment and assignment of benefits for services provided during this visit. Patient/Guardian expressed understanding and agreed to proceed.   [name], [facility] 08/10/2023

## 2023-08-13 LAB — MATERNIT 21 PLUS CORE, BLOOD
Fetal Fraction: 11
Result (T21): NEGATIVE
Trisomy 13 (Patau syndrome): NEGATIVE
Trisomy 18 (Edwards syndrome): NEGATIVE
Trisomy 21 (Down syndrome): NEGATIVE

## 2023-08-13 LAB — MONITOR DRUG PROFILE 14(MW)

## 2023-08-13 LAB — NICOTINE SCREEN, URINE

## 2023-08-19 NOTE — Progress Notes (Signed)
 Referring Provider:  ZELDA HUMMER, CNM  HPI:  Ann Deleon is a 28 y.o.  G2P1001  who presents today for evaluation and management of abnormal cervical cytology.    Prior pap smears:  Date:06/16/23   Eje:ODPO  HPV: POSITIVE; undifferentiated  Date:05/19/22   Eje:ODPO  HPV: NOT TESTED Date:12/17/21   Eje:ODPO  HPV: NOT TESTED Date:08/23/20   Eje:[id number] for evaluation due to extremely scant cellularity. Date:04/11/19   Eje:WPOF  Prior cervical / vaginal findings: n/a  Prior cervical treatment(s): n/a  Symptoms/History:  -Abnormal vaginal discharge: no -Postmenopausal: no -Intermenstrual bleeding: no -Postcoital bleeding: no -Bleeding problems (non-gyn): no -Contraception: no -Number of current sexual partners: 1 -Number of partners in lifetime: 6 -History of a high risk partner: no -History of STDs: no -Smoking: no -Gardasil Vaccine: unsure       ROS:  Pertinent items are noted in HPI.  OB History  Gravida Para Term Preterm AB Living  2 1 1  0 0 1  SAB IAB Ectopic Multiple Live Births  0 0 0 0 1    # Outcome Date GA Lbr Len/2nd Weight Sex Type Anes PTL Lv  2 Current           1 Term 11/03/21 [redacted]w[redacted]d  6 lb 15.1 oz (3.15 kg) F CS-LTranv Spinal  LIV    Past Medical History:  Diagnosis Date   Anxiety    Asthma    GERD (gastroesophageal reflux disease)     Past Surgical History:  Procedure Laterality Date   CESAREAN SECTION N/A 11/03/2021   Procedure: CESAREAN SECTION;  Surgeon: Connell Davies, MD;  Location: [facility] ORS;  Service: Obstetrics;  Laterality: N/A;   MANDIBLE FRACTURE SURGERY     MANDIBLE SURGERY     WISDOM TOOTH EXTRACTION      SOCIAL HISTORY:  Social History   Substance and Sexual Activity  Alcohol Use Not Currently    Social History   Substance and Sexual Activity  Drug Use No     Family History  Problem Relation Age of Onset   Hypertension Mother    Depression Mother    Anxiety disorder Mother    Ovarian cysts Mother     Fibroids Mother    Alcohol abuse Father    Hypertension Father    Diabetes Father    Nephrolithiasis Father    Depression Father    Depression Sister    Breast cancer Neg Hx    Ovarian cancer Neg Hx    Colon cancer Neg Hx     ALLERGIES:  Patient has no known allergies.  She has a current medication list which includes the following prescription(s): albuterol , aspirin  ec, fluticasone , ondansetron , prenatal vit-fe fumarate-fa, and valacyclovir .  Physical Exam: -Vitals:  LMP 05/12/2023 (Exact Date)   PROCEDURE: Colposcopy performed with 4% acetic acid and Lugol's after informed consent obtained.  Physical Exam Vitals and nursing note reviewed. Exam conducted with a chaperone present.  Constitutional:      Appearance: Normal appearance. She is obese.  HENT:     Head: Normocephalic and atraumatic.  Eyes:     Extraocular Movements: Extraocular movements intact.  Pulmonary:     Effort: Pulmonary effort is normal.  Genitourinary:    General: Normal vulva.        Comments: Dotted Red: Ectropion Solid Red: acetowhite lesion, decreased uptake Neurological:     Mental Status: She is alert.                               -  Aceto-white Lesions Location(s): See above              -Biopsy performed: none              -ECC indicated and performed: No.   -Satisfactory colposcopy: Yes.      -Evidence of Invasive cervical CA :  NO  ASSESSMENT:  Ann Deleon is a 28 y.o. G2P1001 with LSIL and HPV-HR positive, undifferentiated, on recent pap (06/16/23), here for colposcopy today, performed as above without complications. Is currently [redacted]w[redacted]d pregnant.  -No biopsies taken today; recommend pt have post-partum colposcopy at 3 mos after delivery -Aftercare instructions for home reviewed, si/sx of when to call/return discussed. -Gardasil vaccine: unsure; defer until after pregnancy completed -UA/Ucx obtained for UTI symptoms/odor -- will treat if indicated -Follow up for scheduled prenatal  visits   Estil Mangle, DO [facility]

## 2023-08-23 ENCOUNTER — Encounter: Payer: Self-pay | Admitting: Obstetrics

## 2023-08-23 ENCOUNTER — Ambulatory Visit: Admitting: Obstetrics

## 2023-08-23 VITALS — BP 130/85 | HR 107 | Ht 69.0 in | Wt 261.0 lb

## 2023-08-23 DIAGNOSIS — R87810 Cervical high risk human papillomavirus (HPV) DNA test positive: Secondary | ICD-10-CM

## 2023-08-23 DIAGNOSIS — R87612 Low grade squamous intraepithelial lesion on cytologic smear of cervix (LGSIL): Secondary | ICD-10-CM | POA: Diagnosis not present

## 2023-08-23 DIAGNOSIS — R350 Frequency of micturition: Secondary | ICD-10-CM | POA: Diagnosis not present

## 2023-08-23 DIAGNOSIS — R8290 Unspecified abnormal findings in urine: Secondary | ICD-10-CM

## 2023-08-23 DIAGNOSIS — A630 Anogenital (venereal) warts: Secondary | ICD-10-CM | POA: Insufficient documentation

## 2023-08-23 LAB — POCT URINALYSIS DIPSTICK
Bilirubin, UA: NEGATIVE
Blood, UA: POSITIVE
Glucose, UA: NEGATIVE
Ketones, UA: NEGATIVE
Leukocytes, UA: NEGATIVE
Nitrite, UA: NEGATIVE
Protein, UA: POSITIVE — AB
Spec Grav, UA: 1.01 (ref 1.010–1.025)
Urobilinogen, UA: 1 U/dL
pH, UA: 6.5 (ref 5.0–8.0)

## 2023-08-23 NOTE — Patient Instructions (Signed)
 Colposcopy, Care After  The following information offers guidance on how to care for yourself after your procedure. Your doctor may also give you more specific instructions. If you have problems or questions, contact your doctor. What can I expect after the procedure? If you did not have a sample of your tissue taken out (did not have a biopsy), you may only have some spotting of blood for a few days. You can go back to your normal activities. If you had a sample of your tissue taken out, it is common to have: Soreness and mild pain. These may last for a few days. Mild bleeding or fluid (discharge) coming from your vagina. The fluid will look dark and grainy. You may have this for a few days. The fluid may be caused by a liquid that was used during your procedure. You may need to wear a sanitary pad. Spotting of blood for at least 48 hours after the procedure. Follow these instructions at home: Medicines Take over-the-counter and prescription medicines only as told by your doctor. Ask your doctor what over-the-counter pain medicines and prescription medicines you can start taking again. This is very important if you take blood thinners. Activity For at least 3 days, or for as long as told by your doctor, avoid: Douching. Using tampons. Having sex. Return to your normal activities as told by your doctor. Ask your doctor what activities are safe for you. General instructions Ask your doctor if you may take baths, swim, or use a hot tub. You may take showers. If you use birth control (contraception), keep using it. Keep all follow-up visits. Contact a doctor if: You have a fever or chills. You faint or feel light-headed. Get help right away if: You bleed a lot from your vagina. A lot of bleeding means that the bleeding soaks through a pad in less than 1 hour. You have clumps of blood (blood clots) coming from your vagina. You have signs that could mean you have an infection. This may be  fluid coming from your vagina that is: Different than normal. Yellow. Bad-smelling. You have very bad pain or cramps in your lower belly that do not get better with medicine. Summary If you did not have a sample of your tissue taken out, you may only have some spotting of blood for a few days. You can go back to your normal activities. If you had a sample of your tissue taken out, it is common to have mild pain for a few days and spotting for 48 hours. Avoid douching, using tampons, and having sex for at least 3 days after the procedure or for as long as told. Get help right away if you have a lot of bleeding, very bad pain, or signs of infection. This information is not intended to replace advice given to you by your health care provider. Make sure you discuss any questions you have with your health care provider. Document Revised: 06/30/2020 Document Reviewed: 06/30/2020 Elsevier Patient Education  2024 [employer].

## 2023-08-25 ENCOUNTER — Ambulatory Visit: Payer: Self-pay | Admitting: Obstetrics

## 2023-08-25 LAB — URINE CULTURE

## 2023-09-01 ENCOUNTER — Ambulatory Visit (INDEPENDENT_AMBULATORY_CARE_PROVIDER_SITE_OTHER): Admitting: Obstetrics

## 2023-09-01 ENCOUNTER — Encounter: Payer: Self-pay | Admitting: Obstetrics

## 2023-09-01 VITALS — BP 131/82 | HR 77 | Wt 263.5 lb

## 2023-09-01 DIAGNOSIS — Z3482 Encounter for supervision of other normal pregnancy, second trimester: Secondary | ICD-10-CM

## 2023-09-01 DIAGNOSIS — Z3A15 15 weeks gestation of pregnancy: Secondary | ICD-10-CM | POA: Diagnosis not present

## 2023-09-01 DIAGNOSIS — Z3689 Encounter for other specified antenatal screening: Secondary | ICD-10-CM

## 2023-09-01 DIAGNOSIS — Z3480 Encounter for supervision of other normal pregnancy, unspecified trimester: Secondary | ICD-10-CM

## 2023-09-01 NOTE — Assessment & Plan Note (Signed)
-   Discussed increasing water intake at work and if that is not doable, trying to meet adequate hydration before and after work. -Reviewed kick counts and preterm labor warning signs. Instructed to call office or come to hospital with persistent headache, vision changes, regular contractions, leaking of fluid, decreased fetal movement or vaginal bleeding.

## 2023-09-01 NOTE — Progress Notes (Addendum)
    Return Prenatal Note   Assessment/Plan   Plan  28 y.o. G2P1001 at [redacted]w[redacted]d presents for follow-up OB visit. Reviewed prenatal record including previous visit note.  Supervision of other normal pregnancy, antepartum - Discussed increasing water intake at work and if that is not doable, trying to meet adequate hydration before and after work. -Reviewed kick counts and preterm labor warning signs. Instructed to call office or come to hospital with persistent headache, vision changes, regular contractions, leaking of fluid, decreased fetal movement or vaginal bleeding.      Orders Placed This Encounter  Procedures   US  OB Comp + 14 Wk    Standing Status:   Future    Expected Date:   09/15/2023    Expiration Date:   03/03/2024    Reason for Exam (SYMPTOM  OR DIAGNOSIS REQUIRED):   anatomy    Preferred Imaging Location?:   External   Return in about 4 weeks (around 09/29/2023).   Future Appointments  Date Time Provider Department Center  09/06/2023 11:00 AM Veva Almarie BIRCH, [facility] None  09/28/2023 11:00 AM Veva Almarie BIRCH, [facility] None  09/30/2023  1:00 PM [facility] US  1 [facility] None  09/30/2023  2:35 PM Jayne Harlene CROME, CNM [facility] None    For next visit:  Routine prenatal care    Subjective  Saesha if feeling well overall, she has had headaches the past few days, though she reports it is hard to get adequate water intake at work.   Movement: Absent Contractions: Not present  Objective   Flow sheet Vitals: Pulse Rate: 77 BP: 131/82 Fetal Heart Rate (bpm): 135 Total weight gain: 13 lb 8 oz (6.124 kg)  General Appearance  No acute distress, well appearing, and well nourished Pulmonary   Normal work of breathing Neurologic   Alert and oriented to person, place, and time Psychiatric   Mood and affect within normal limits  Rosina Hamilton, [facility] 09/01/23 3:45 PM

## 2023-09-03 ENCOUNTER — Encounter: Admitting: Certified Nurse Midwife

## 2023-09-06 ENCOUNTER — Other Ambulatory Visit: Payer: Self-pay | Admitting: Certified Nurse Midwife

## 2023-09-06 ENCOUNTER — Encounter: Payer: Self-pay | Admitting: Certified Nurse Midwife

## 2023-09-06 ENCOUNTER — Ambulatory Visit: Admitting: Professional Counselor

## 2023-09-06 DIAGNOSIS — O26892 Other specified pregnancy related conditions, second trimester: Secondary | ICD-10-CM

## 2023-09-06 DIAGNOSIS — O99891 Other specified diseases and conditions complicating pregnancy: Secondary | ICD-10-CM

## 2023-09-06 DIAGNOSIS — Z3A16 16 weeks gestation of pregnancy: Secondary | ICD-10-CM

## 2023-09-10 ENCOUNTER — Other Ambulatory Visit: Payer: Self-pay | Admitting: Certified Nurse Midwife

## 2023-09-10 DIAGNOSIS — M549 Dorsalgia, unspecified: Secondary | ICD-10-CM

## 2023-09-23 ENCOUNTER — Encounter: Payer: Self-pay | Admitting: Emergency Medicine

## 2023-09-23 ENCOUNTER — Emergency Department
Admission: EM | Admit: 2023-09-23 | Discharge: 2023-09-23 | Disposition: A | Discharge status: Home / Self Care | Attending: Emergency Medicine | Admitting: Emergency Medicine

## 2023-09-23 ENCOUNTER — Other Ambulatory Visit: Payer: Self-pay

## 2023-09-23 ENCOUNTER — Emergency Department

## 2023-09-23 DIAGNOSIS — O209 Hemorrhage in early pregnancy, unspecified: Secondary | ICD-10-CM | POA: Insufficient documentation

## 2023-09-23 DIAGNOSIS — O4690 Antepartum hemorrhage, unspecified, unspecified trimester: Secondary | ICD-10-CM

## 2023-09-23 DIAGNOSIS — Z3A18 18 weeks gestation of pregnancy: Secondary | ICD-10-CM | POA: Insufficient documentation

## 2023-09-23 LAB — BASIC METABOLIC PANEL WITH GFR
Anion gap: 10 (ref 5–15)
BUN: 8 mg/dL (ref 6–20)
CO2: 21 mmol/L — ABNORMAL LOW (ref 22–32)
Calcium: 9.2 mg/dL (ref 8.9–10.3)
Chloride: 106 mmol/L (ref 98–111)
Creatinine, Ser: 0.5 mg/dL (ref 0.44–1.00)
GFR, Estimated: >60 mL/min (ref >60)
Glucose, Bld: 128 mg/dL — ABNORMAL HIGH (ref 70–99)
Potassium: 4 mmol/L (ref 3.5–5.1)
Sodium: 137 mmol/L (ref 135–145)

## 2023-09-23 LAB — URINALYSIS, ROUTINE W REFLEX MICROSCOPIC
Bacteria, UA: NONE SEEN
Bilirubin Urine: NEGATIVE
Glucose, UA: NEGATIVE mg/dL
Ketones, ur: NEGATIVE mg/dL
Nitrite: NEGATIVE
Protein, ur: 30 mg/dL — AB
Specific Gravity, Urine: 1.03 (ref 1.005–1.030)
pH: 5 (ref 5.0–8.0)

## 2023-09-23 LAB — ABO/RH: ABO/RH(D): O POS

## 2023-09-23 LAB — CBC
HCT: 37.9 % (ref 36.0–46.0)
Hemoglobin: 12.3 g/dL (ref 12.0–15.0)
MCH: 27.3 pg (ref 26.0–34.0)
MCHC: 32.5 g/dL (ref 30.0–36.0)
MCV: 84.2 fL (ref 80.0–100.0)
Platelets: 259 K/uL (ref 150–400)
RBC: 4.5 MIL/uL (ref 3.87–5.11)
RDW: 14.1 % (ref 11.5–15.5)
WBC: 9.9 K/uL (ref 4.0–10.5)
nRBC: 0 % (ref 0.0–0.2)

## 2023-09-23 LAB — HCG, QUANTITATIVE, PREGNANCY: hCG, Beta Chain, Quant, S: 29054 m[IU]/mL — ABNORMAL HIGH (ref <5)

## 2023-09-23 NOTE — Discharge Instructions (Addendum)
 You were evaluated in the ED for vaginal bleeding during pregnancy.  Your lab work is reassuring.  An ultrasound was performed and reveals a single live intrauterine pregnancy estimated age 26 weeks and 4 days and otherwise no explanation of your episode of vaginal bleeding.  You are encouraged close follow-up with OB/GYN for further evaluation in 48 hours.

## 2023-09-23 NOTE — ED Notes (Signed)
 ED Provider at bedside.

## 2023-09-23 NOTE — ED Provider Notes (Signed)
 [facility] Emergency Department Provider Note     Event Date/Time   First MD Initiated Contact with Patient 09/23/23 1424     (approximate)   History   Vaginal Bleeding   HPI  Ann Deleon is a 28 y.o. female G2P1 currently [redacted] weeks pregnant presents to the ED for evaluation of vaginal bleeding while wiping with 2 small blood clots noticed today.  Patient states 1 episode of this, but wanted to be evaluated because this did not happen in her first pregnancy.  Patient also reports she has been constipated and unsure if this bleeding came from her rectum, but does not think so.  She also notes she is returning from [place] and was on a plane recently.  She endorses fetal movement.  Denies abdominal pain or cramping, chest pain, shortness of breath, leg pain or swelling, nausea vomiting and urinary symptoms.    Physical Exam   Triage Vital Signs: ED Triage Vitals  Encounter Vitals Group     BP 09/23/23 1258 (!) 151/88     Girls Systolic BP Percentile --      Girls Diastolic BP Percentile --      Boys Systolic BP Percentile --      Boys Diastolic BP Percentile --      Pulse Rate 09/23/23 1258 (!) 119     Resp 09/23/23 1258 19     Temp 09/23/23 1258 97.6 F (36.4 C)     Temp Source 09/23/23 1258 Oral     SpO2 09/23/23 1258 100 %     Weight 09/23/23 1300 260 lb (117.9 kg)     Height 09/23/23 1300 5' 9 (1.753 m)     Head Circumference --      Peak Flow --      Pain Score 09/23/23 1300 0     Pain Loc --      Pain Education --      Exclude from Growth Chart --     Most recent vital signs: Vitals:   09/23/23 1258 09/23/23 1720  BP: (!) 151/88 117/64  Pulse: (!) 119 90  Resp: 19 18  Temp: 97.6 F (36.4 C) 98.1 F (36.7 C)  SpO2: 100% 100%    General Awake, no distress.  Well-appearing HEENT NCAT.  CV:  Good peripheral perfusion.   RESP:  Normal effort.  ABD:  No distention.  nontender to palpation. Other:  GU including pelvic exam  deferred py patient. Rectal exam deferred.    ED Results / Procedures / Treatments   Labs (all labs ordered are listed, but only abnormal results are displayed) Labs Reviewed  BASIC METABOLIC PANEL WITH GFR - Abnormal; Notable for the following components:      Result Value   CO2 21 (*)    Glucose, Bld 128 (*)    All other components within normal limits  HCG, QUANTITATIVE, PREGNANCY - Abnormal; Notable for the following components:   hCG, Beta Chain, Quant, S 29,054 (*)    All other components within normal limits  URINALYSIS, ROUTINE W REFLEX MICROSCOPIC - Abnormal; Notable for the following components:   Color, Urine YELLOW (*)    APPearance HAZY (*)    Hgb urine dipstick MODERATE (*)    Protein, ur 30 (*)    Leukocytes,Ua TRACE (*)    All other components within normal limits  CBC  ABO/RH   RADIOLOGY  I personally viewed and evaluated these images as part of my medical decision  making, as well as reviewing the written report by the radiologist.  ED Provider Interpretation: Single live intrauterine pregnancy  US  OB Limited > 14 wks Result Date: 09/23/2023 CLINICAL DATA:  Vaginal bleeding EXAM: LIMITED OBSTETRIC ULTRASOUND COMPARISON:  07/06/2023 FINDINGS: Number of Fetuses: 1 Heart Rate:  150 bpm Movement: Yes Presentation: Cephalic Placental Location: Posterior Previa: No Amniotic Fluid (Subjective):  Within normal limits. FL:: 2.8 cm 18 w  4 d MATERNAL FINDINGS: Cervix:  Closed, 4.1 cm Uterus/Adnexae: No adnexal masses or free fluid. IMPRESSION: 1. Single live intrauterine pregnancy as above, estimated age 56 weeks and 4 days. This is concordant with the assigned age of 18 weeks and 5 days. This exam is performed on an emergent basis and does not comprehensively evaluate fetal size, dating, or anatomy; follow-up complete OB US  should be considered if further fetal assessment is warranted. Electronically Signed   By: Ozell Daring M.D.   On: 09/23/2023 16:19     PROCEDURES:  Critical Care performed: No  Procedures   MEDICATIONS ORDERED IN ED: Medications - No data to display   IMPRESSION / MDM / ASSESSMENT AND PLAN / ED COURSE  I reviewed the triage vital signs and the nursing notes.                              Clinical Course as of 09/23/23 1736  Thu Sep 23, 2023  1535 CBC Stable hemoglobin [MH]  1536 Hgb urine dipstickROLLEN): MODERATE [MH]  1658 hCG, quantitative, pregnancy(!) 29054 [MH]  1703 US  OB Limited > 14 wks 1. Single live intrauterine pregnancy as above, estimated age 32 weeks and 4 days. This is concordant with the assigned age of 18 weeks and 5 days.  This exam is performed on an emergent basis and does not comprehensively evaluate fetal size, dating, or anatomy; follow-up complete OB US  should be considered if further fetal assessment is warranted.   [MH]    Clinical Course User Index [MH] Margrette Monte A, PA-C    28 y.o. female presents to the emergency department for evaluation and treatment of vaginal bleeding during pregnancy. See HPI for further details.   Differential diagnosis includes, but is not limited to placenta previa, placenta abruption, threatened miscarriage, incomplete miscarriage, vaginal/cervical trauma, subchorionic hemorrhage/hematoma, hemorrhoids.   Patient's presentation is most consistent with acute complicated illness / injury requiring diagnostic workup.  Patient is alert and oriented.  She is hemodynamically stable.  Initially her blood pressure was 119 BMP with elevated BP of 151/88 but this did improve through the duration of her visit. workup is overall reassuring.  CBC shows hemoglobin is stable.  Urine shows moderate Hgb and RBC.  Limited physical exam as patient deferred GU and DRE.  Her abdomen exam is normal.  She is asymptomatic.  She does note a recent colposcopy without a biopsy was performed.  Chart reviewed procedure performed 07/07 I advised 48-hour follow-up with OB/GYN  for further evaluation.  I also encouraged close monitoring of vaginal bleeding if it returns warranting return to the ED.  She verbalized understanding.  All questions concerns were addressed during this ED visit.  Patient stable at discharge.   FINAL CLINICAL IMPRESSION(S) / ED DIAGNOSES   Final diagnoses:  Vaginal bleeding in pregnancy   Rx / DC Orders   ED Discharge Orders     None      Note:  This document was prepared using Dragon voice recognition software and  may include unintentional dictation errors.    Margrette, Quadasia Newsham A, PA-C 09/23/23 1753    Claudene Rover, MD 09/23/23 [phone]

## 2023-09-23 NOTE — ED Triage Notes (Signed)
 Pt via POV from home. Pt is [redacted] weeks pregnant, c/o vaginal bleeding with clots. Denies any abd cramping. Denies urinary symptoms. G2P1. Pt is A&Ox4 and NAD, ambulatory to triage.

## 2023-09-28 ENCOUNTER — Ambulatory Visit (INDEPENDENT_AMBULATORY_CARE_PROVIDER_SITE_OTHER): Admitting: Professional Counselor

## 2023-09-28 DIAGNOSIS — F439 Reaction to severe stress, unspecified: Secondary | ICD-10-CM

## 2023-09-28 DIAGNOSIS — F411 Generalized anxiety disorder: Secondary | ICD-10-CM | POA: Diagnosis not present

## 2023-09-28 NOTE — Progress Notes (Signed)
  THERAPIST PROGRESS NOTE  Session Time: 11:02 AM - 11:55 AM  Participation Level: Active  Behavioral Response: Casual, Alert, Anxious  Type of Therapy: Individual Therapy  Treatment Goals addressed: Active Anxiety  LTG: Maybe more trust. Maybe stop over-thinking. (Not Progressing)    Start:  06/03/23    Expected End:  06/01/24    Goal Note Reviewed 09/28/23 That ain't got nowhere.   STG: I think it's letting (daughter) go places without me or my husband and knowing she will be fine. To reduce sxs of anxiety AEB using grounding techniques and restructuring maladaptive patterns of thinking over the next 90 days. (Not Progressing)   Goal Note Reviewed 09/28/23 No where. I think just not being able to control what she's doing.  STG: I think I look too far into situations. To improve mindfulness AEB utilizing mindfulness exercises and focusing on what's within her control over the next 90 days.  (Progressing)   Goal Note Reviewed 09/28/23 - Not sure. I try to focus on the moment but then sometimes it just gets away.   ProgressTowards Goals: Progressing  Interventions: CBT and Supportive  Summary: Ann Deleon is a 28 y.o. female who presents with a history of anxiety, depression, and trauma. She appeared alert and oriented x5. She reported a pregnancy scare due to bleeding but everything checked out okay. Ozell reviewed treatment plan and noted minimal progress. She engaged in creating an exposure hierarchy which mostly included anxieties around her daughter's care. She was receptive to restructuring thinking around these events and ways to practice gradual exposure.   Therapist Response: Conducted session with [place]. Began session with check-in/update since previous session. Utilized empathetic and reflective listening. Used open-ended questions to facilitate discussion and summarized [name]'s thoughts/feelings. Reviewed treatment plan with input from [facility] on current  strengths, needs, and progress towards goals. Explained cycle of avoidance/anxiety trap. Assisted with creating an exposure hierarchy and modeled ways to restructure thinking around catastrophic thinking. Scheduled additional appointment and concluded session.   Suicidal/Homicidal: No  Plan: Return again in 4 weeks.  Diagnosis: GAD (generalized anxiety disorder)  Trauma and stressor-related disorder  Collaboration of Care: Medication Management AEB chart review  Patient/Guardian was advised Release of Information must be obtained prior to any record release in order to collaborate their care with an outside provider. Patient/Guardian was advised if they have not already done so to contact the registration department to sign all necessary forms in order for us  to release information regarding their care.   Consent: Patient/Guardian gives verbal consent for treatment and assignment of benefits for services provided during this visit. Patient/Guardian expressed understanding and agreed to proceed.   [name], [facility] 09/29/2023

## 2023-09-30 ENCOUNTER — Encounter: Payer: Self-pay | Admitting: Obstetrics and Gynecology

## 2023-09-30 ENCOUNTER — Ambulatory Visit (INDEPENDENT_AMBULATORY_CARE_PROVIDER_SITE_OTHER): Admitting: Obstetrics and Gynecology

## 2023-09-30 ENCOUNTER — Encounter: Admitting: Certified Nurse Midwife

## 2023-09-30 ENCOUNTER — Ambulatory Visit

## 2023-09-30 VITALS — BP 123/79 | HR 94 | Wt 266.8 lb

## 2023-09-30 DIAGNOSIS — Z3A19 19 weeks gestation of pregnancy: Secondary | ICD-10-CM

## 2023-09-30 DIAGNOSIS — Z3482 Encounter for supervision of other normal pregnancy, second trimester: Secondary | ICD-10-CM

## 2023-09-30 DIAGNOSIS — Z3689 Encounter for other specified antenatal screening: Secondary | ICD-10-CM | POA: Diagnosis not present

## 2023-09-30 DIAGNOSIS — Z3A20 20 weeks gestation of pregnancy: Secondary | ICD-10-CM | POA: Diagnosis not present

## 2023-09-30 DIAGNOSIS — Z3A15 15 weeks gestation of pregnancy: Secondary | ICD-10-CM

## 2023-09-30 DIAGNOSIS — Z3480 Encounter for supervision of other normal pregnancy, unspecified trimester: Secondary | ICD-10-CM

## 2023-09-30 NOTE — Progress Notes (Signed)
 ROB. Patient states daily fetal movement. Anatomy ultrasound this afternoon. She states no questions or concerns at this time.

## 2023-09-30 NOTE — Progress Notes (Signed)
 ROB:  19.5.  Continues to experience daily nausea and vomiting.  She says this happened with her last pregnancy and nothing ever fixed it.  She does keep food down during the day but has some vomiting almost every morning.  She is feeling daily fetal movement at this point.  She was seen in the emergency department 1 week ago for vaginal bleeding.  This has since resolved.  Anatomy ultrasound scheduled for this afternoon.

## 2023-10-05 ENCOUNTER — Other Ambulatory Visit: Payer: Self-pay

## 2023-10-12 ENCOUNTER — Encounter: Payer: Self-pay | Admitting: Certified Nurse Midwife

## 2023-10-13 ENCOUNTER — Other Ambulatory Visit: Payer: Self-pay

## 2023-10-13 ENCOUNTER — Observation Stay
Admission: EM | Admit: 2023-10-13 | Discharge: 2023-10-13 | Disposition: A | Discharge status: Home / Self Care | Attending: Certified Nurse Midwife | Admitting: Certified Nurse Midwife

## 2023-10-13 ENCOUNTER — Encounter: Payer: Self-pay | Admitting: Obstetrics

## 2023-10-13 DIAGNOSIS — Z3A21 21 weeks gestation of pregnancy: Secondary | ICD-10-CM

## 2023-10-13 DIAGNOSIS — O4692 Antepartum hemorrhage, unspecified, second trimester: Secondary | ICD-10-CM

## 2023-10-13 DIAGNOSIS — O368320 Maternal care for abnormalities of the fetal heart rate or rhythm, second trimester, not applicable or unspecified: Secondary | ICD-10-CM | POA: Insufficient documentation

## 2023-10-13 DIAGNOSIS — O209 Hemorrhage in early pregnancy, unspecified: Principal | ICD-10-CM | POA: Insufficient documentation

## 2023-10-13 LAB — URINALYSIS, ROUTINE W REFLEX MICROSCOPIC
Bilirubin Urine: NEGATIVE
Glucose, UA: NEGATIVE mg/dL
Ketones, ur: NEGATIVE mg/dL
Leukocytes,Ua: NEGATIVE
Nitrite: NEGATIVE
Protein, ur: NEGATIVE mg/dL
Specific Gravity, Urine: 1.027 (ref 1.005–1.030)
pH: 6 (ref 5.0–8.0)

## 2023-10-13 LAB — WET PREP, GENITAL
Clue Cells Wet Prep HPF POC: NONE SEEN
Sperm: NONE SEEN
Trich, Wet Prep: NONE SEEN
WBC, Wet Prep HPF POC: <10 (ref <10)
Yeast Wet Prep HPF POC: NONE SEEN

## 2023-10-13 NOTE — OB Triage Note (Signed)
 Pt reports to labor and delivery with complaints of vaginal bleeding. States that she was seen in the ED about 1 week ago for vaginal bleeding. Pt showed RN a picture of first bleeding episode - 2 eraser sized clots noted. This time, bleeding was noted as just a small streak on the TP after peeing.   Last intercourse was last night.   Monday she was sick - threw up her snack of cheese, crackers, and pickles.   Positive fetal movement. Denies contractions. States mild cramping. Denies LOF.  Doppler FHT 150s.

## 2023-10-13 NOTE — OB Triage Note (Signed)

## 2023-10-13 NOTE — OB Triage Note (Signed)
 LABOR & DELIVERY OB TRIAGE NOTE  SUBJECTIVE  HPI Ann Deleon is a 28 y.o. G2P1001 at [redacted]w[redacted]d who presents to Labor & Delivery for bleeding in the second trimester. She noticed a pink tinged streak on her toilet paper after wiping. She was seen in the ED a few weeks ago for bleeding as well. She has experienced no further bleeding between episodes. She reports some light cramping today.   OB History     Gravida  2   Para  1   Term  1   Preterm  0   AB  0   Living  1      SAB  0   IAB  0   Ectopic  0   Multiple  0   Live Births  1            OBJECTIVE  BP 138/72 (BP Location: Left Arm)   Pulse 100   Temp 98.4 F (36.9 C) (Oral)   Resp 18   Ht 5' 8 (1.727 m)   Wt 117.9 kg   LMP 05/12/2023 (Exact Date)   BMI 39.53 kg/m   General: A&O x3 Lungs: no increased work of breathing.  Abdomen: soft, non-tender Speculum exam- no bleeding noted.   FHTs - 150s  ASSESSMENT Impression  1) Pregnancy at G2P1001, [redacted]w[redacted]d, Estimated Date of Delivery: 02/19/24 2) Reassuring maternal/fetal status 3) Bleeding likely from IC last evening. Cervix appears friable.  4)Wet prep and UA pending  PLAN  1)Discharged home with bleeding precautions. Will treat for any positive results.   Damien PARSLEY, CNM  10/13/23  2:52 PM

## 2023-10-14 LAB — CULTURE, OB URINE: Culture: <10000 — AB

## 2023-10-26 ENCOUNTER — Ambulatory Visit: Admitting: Professional Counselor

## 2023-10-27 NOTE — Progress Notes (Unsigned)
    Return Prenatal Note   Subjective   28 y.o. G2P1001 at [redacted]w[redacted]d presents for this follow-up prenatal visit.  Patient is doing well. She has no new concerns today. Patient reports: Movement: Present Contractions: Not present  Objective   Flow sheet Vitals: Pulse Rate: 88 BP: 115/65 Fundal Height: 23 cm Fetal Heart Rate (bpm): 140 Total weight gain: 15 lb 6.4 oz (6.985 kg)  General Appearance  No acute distress, well appearing, and well nourished Pulmonary   Normal work of breathing Neurologic   Alert and oriented to person, place, and time Psychiatric   Mood and affect within normal limits   Assessment/Plan   Plan  28 y.o. G2P1001 at [redacted]w[redacted]d presents for follow-up OB visit. Reviewed prenatal record including previous visit note.  Supervision of other normal pregnancy, antepartum Reviewed that when you are pregnant, you are typically more sensitive to blood sugar drops if you consume sugar.  Discussed comfort measures for skin irritation in pregnancy.  Reviewed 28 week labs for NV Reviewed red flag warning signs       Orders Placed This Encounter  Procedures   28 Week RH+Panel    Standing Status:   Future    Expected Date:   11/24/2023    Expiration Date:   02/22/2024   No follow-ups on file.   Future Appointments  Date Time Provider Department Center  11/09/2023 11:00 AM Veva Almarie BIRCH, [facility] None  11/24/2023  8:00 AM [facility] LAB [facility] None  11/24/2023  8:35 AM Leigh Sober, MD [facility] None    For next visit:  ROB with 1 hour glucola, third trimester labs, and Tdap, Medicaid form.  Damien Parsley, CNM [facility] 09/11/255:50 PM

## 2023-10-28 ENCOUNTER — Encounter: Payer: Self-pay | Admitting: Certified Nurse Midwife

## 2023-10-28 ENCOUNTER — Ambulatory Visit: Admitting: Certified Nurse Midwife

## 2023-10-28 VITALS — BP 115/65 | HR 88 | Wt 265.4 lb

## 2023-10-28 DIAGNOSIS — Z3482 Encounter for supervision of other normal pregnancy, second trimester: Secondary | ICD-10-CM | POA: Diagnosis not present

## 2023-10-28 DIAGNOSIS — Z3A23 23 weeks gestation of pregnancy: Secondary | ICD-10-CM

## 2023-10-28 DIAGNOSIS — Z113 Encounter for screening for infections with a predominantly sexual mode of transmission: Secondary | ICD-10-CM

## 2023-10-28 DIAGNOSIS — Z131 Encounter for screening for diabetes mellitus: Secondary | ICD-10-CM

## 2023-10-28 DIAGNOSIS — Z3480 Encounter for supervision of other normal pregnancy, unspecified trimester: Secondary | ICD-10-CM

## 2023-10-28 DIAGNOSIS — Z130 Encounter for screening for diseases of the blood and blood-forming organs and certain disorders involving the immune mechanism: Secondary | ICD-10-CM

## 2023-10-28 DIAGNOSIS — Z114 Encounter for screening for human immunodeficiency virus [HIV]: Secondary | ICD-10-CM

## 2023-10-28 NOTE — Assessment & Plan Note (Signed)
 Reviewed that when you are pregnant, you are typically more sensitive to blood sugar drops if you consume sugar.  Discussed comfort measures for skin irritation in pregnancy.  Reviewed 28 week labs for NV Reviewed red flag warning signs

## 2023-11-09 ENCOUNTER — Ambulatory Visit (INDEPENDENT_AMBULATORY_CARE_PROVIDER_SITE_OTHER): Admitting: Professional Counselor

## 2023-11-09 DIAGNOSIS — F439 Reaction to severe stress, unspecified: Secondary | ICD-10-CM | POA: Diagnosis not present

## 2023-11-09 DIAGNOSIS — F411 Generalized anxiety disorder: Secondary | ICD-10-CM

## 2023-11-09 NOTE — Progress Notes (Signed)
  THERAPIST PROGRESS NOTE  Session Time: 11:00 AM - 11:51 AM  Participation Level: Active  Behavioral Response: Casual, Alert, Anxious  Type of Therapy: Individual Therapy  Treatment Goals addressed: Active Anxiety  LTG: Maybe more trust. Maybe stop over-thinking. (Not Progressing)                Start:  06/03/23    Expected End:  06/01/24    Goal Note Reviewed 09/28/23 That ain't got nowhere.    STG: I think it's letting (daughter) go places without me or my husband and knowing she will be fine. To reduce sxs of anxiety AEB using grounding techniques and restructuring maladaptive patterns of thinking over the next 90 days. (Not Progressing)    Goal Note Reviewed 09/28/23 No where. I think just not being able to control what she's doing.   STG: I think I look too far into situations. To improve mindfulness AEB utilizing mindfulness exercises and focusing on what's within her control over the next 90 days.  (Progressing)    Goal Note Reviewed 09/28/23 - Not sure. I try to focus on the moment but then sometimes it just gets away.   ProgressTowards Goals: Progressing  Interventions: CBT, Solution Focused, and Supportive  Summary: Ann Deleon is a 28 y.o. female who presents with a history of anxiety and trauma. She appeared tired but oriented x5. She reported her daughter turned 4 and they had her party over the weekend. She shared ongoing issues with her daughter biting in daycare. Ann Deleon was receptive to behavior change strategies and reported plans to talk with the daycare about these tips as well. Ann Deleon reported she was not able to work on anxiety exposure exercises. She reported another concern with her health and feeling dismissed by hospital staff. She is looking forward to getting through her third trimester and noted they are supposed to schedule her C-section for the end of December.   Therapist Response: Conducted session with Ann Deleon. Began session with  check-in/update since previous session. Utilized empathetic and reflective listening. Shared behavior change strategies, including positive reinforcement and replacement behaviors. Used open-ended questions to facilitate discussion and summarized Ann Deleon's thoughts/feelings. Scheduled additional appointment and concluded session.   Suicidal/Homicidal: No  Plan: Return again in 4 weeks.  Diagnosis: GAD (generalized anxiety disorder)  Trauma and stressor-related disorder  Collaboration of Care: Medication Management AEB chart review  Patient/Guardian was advised Release of Information must be obtained prior to any record release in order to collaborate their care with an outside provider. Patient/Guardian was advised if they have not already done so to contact the registration department to sign all necessary forms in order for us  to release information regarding their care.   Consent: Patient/Guardian gives verbal consent for treatment and assignment of benefits for services provided during this visit. Patient/Guardian expressed understanding and agreed to proceed.   Ann Deleon, [facility] 11/09/2023

## 2023-11-22 NOTE — Patient Instructions (Signed)
 Third Trimester of Pregnancy  The third trimester of pregnancy is from week 28 through week 40. This is months 7 through 9. The third trimester is a time when your baby is growing fast. Body changes during your third trimester Your body continues to change during this time. The changes usually go away after your baby is born. Physical changes You will continue to gain weight. You may get stretch marks on your hips, belly, and breasts. Your breasts will keep growing and may hurt. A yellow fluid (colostrum) may leak from your breasts. This is the first milk you're making for your baby. Your hair may grow faster and get thicker. In some cases, you may get hair loss. Your belly button may stick out. You may have more swelling in your hands, face, or ankles. Health changes You may have heartburn. You may feel short of breath. This is caused by the uterus that is now bigger. You may have more aches in the pelvis, back, or thighs. You may have more tingling or numbness in your hands, arms, and legs. You may pee more often. You may have trouble pooping (constipation) or swollen veins in the butt that can itch or get painful (hemorrhoids). Other changes You may have more problems sleeping. You may notice the baby moving lower in your belly (dropping). You may have more fluid coming from your vagina. Your joints may feel loose, and you may have pain around your pelvic bone. Follow these instructions at home: Medicines Take medicines only as told by your health care provider. Some medicines are not safe during pregnancy. Your provider may change the medicines that you take. Do not take any medicines unless told to by your provider. Take a prenatal vitamin that has at least 600 micrograms (mcg) of folic acid. Do not use herbal medicines, illegal drugs, or medicines that are not approved by your provider. Eating and drinking While you're pregnant your body needs additional nutrition to help  support your growing baby. Talk with your provider about your nutritional needs. Activity Most women are able to exercise regularly during pregnancy. Exercise routines may need to change at the end of your pregnancy. Talk to your provider about your activities and exercise routine. Relieving pain and discomfort Rest often with your legs raised if you have leg cramps or low back pain. Take warm sitz baths to soothe pain from hemorrhoids. Use hemorrhoid cream if your provider says it's okay. Wear a good, supportive bra if your breasts hurt. Do not use hot tubs, steam rooms, or saunas. Do not douche. Do not use tampons or scented pads. Safety Talk to your provider before traveling far distances. Wear your seatbelt at all times when you're in a car. Talk to your provider if someone hits you, hurts you, or yells at you. Preparing for birth To prepare for your baby: Take childbirth and breastfeeding classes. Visit the hospital and tour the maternity area. Buy a rear-facing car seat. Learn how to install it in your car. General instructions Avoid cat litter boxes and soil used by cats. These things carry germs that can cause harm to your pregnancy and your baby. Do not drink alcohol, smoke, vape, or use products with nicotine or tobacco in them. If you need help quitting, talk with your provider. Keep all follow-up visits for your third trimester. Your provider will do more exams and tests during this trimester. Write down your questions. Take them to your prenatal visits. Your provider also will: Talk with you about  your overall health. Give you advice or refer you to specialists who can help with different needs, including: Mental health and counseling. Foods and healthy eating. Ask for help if you need help with food. Where to find more information American Pregnancy Association: americanpregnancy.org [employer]ians [employer] Gynecologists: acog.org Office on [employer] Health:  [employer] Contact a health care provider if: You have a headache that does not go away when you take medicine. You have any of these problems: You can't eat or drink. You have nausea and vomiting. You have watery poop (diarrhea) for 2 days or more. You have pain when you pee, or your pee smells bad. You have been sick for 2 days or more and aren't getting better. Contact your provider right away if: You have any of these coming from your vagina: Abnormal discharge. Bad-smelling fluid. Bleeding. Your baby is moving less than usual. You have signs of labor: You have any contractions, belly cramping, or have pain in your pelvis or lower back before 37 weeks of pregnancy (preterm labor). You have regular contractions that are less than 5 minutes apart. Your water breaks. You have symptoms of high blood pressure or preeclampsia. These include: A severe, throbbing headache that does not go away. Sudden or extreme swelling of your face, hands, legs, or feet. Vision problems: You see spots. You have blurry vision. Your eyes are sensitive to light. If you can't reach your provider, go to an urgent care or emergency room. Get help right away if: You faint, become confused, or can't think clearly. You have chest pain or trouble breathing. You have any kind of injury, such as from a fall or a car crash. These symptoms may be an emergency. Call 911 right away. Do not wait to see if the symptoms will go away. Do not drive yourself to the hospital. This information is not intended to replace advice given to you by your health care provider. Make sure you discuss any questions you have with your health care provider. Document Revised: 11/05/2022 Document Reviewed: 06/05/2022 Elsevier Patient Education  2024 [employer].

## 2023-11-23 NOTE — Progress Notes (Unsigned)
    Return Prenatal Note   Subjective  28 y.o. G2P1001 at [redacted]w[redacted]d presents for this follow-up prenatal visit. Pregnancy notable for maternal BMI and prior CD x 1 for breech, desiring repeat.   Pt and partner here together. She would like Tdap and Flu vaccines today. Did 1hGTT & 28wk labs earlier this morning. She would also like to discuss having a repeat cesarean with BTL. She and husband are certain they are done with childbearing.   Patient reports: Movement: Present Contractions: Not present Denies vaginal bleeding or leaking fluid. Objective  Flow sheet Vitals: Pulse Rate: 85 BP: 131/74 Fundal Height: 27 cm Fetal Heart Rate (bpm): 151 Total weight gain: 10 lb 8 oz (4.763 kg)  General Appearance  No acute distress, well appearing, and well nourished Pulmonary   Normal work of breathing Neurologic   Alert and oriented to person, place, and time Psychiatric   Mood and affect within normal limits   Assessment/Plan   Plan  28 y.o. G2P1001 at [redacted]w[redacted]d by 7wk US  presents for follow-up OB visit. Reviewed prenatal record including previous visit note. 1. Supervision of high risk pregnancy, antepartum (Primary) -1hGTT and 28wk labs done today, earlier this morning -Tdap and Flu vaccines given -Reviewed third trimester anticipatory guidance  2. Previous cesarean delivery affecting pregnancy -Pt desiring repeat w/BTL -Requesting 12/29 as surgery date, pt aware this I will be out, will arrange with other physicians.   3. Request for sterilization -Patient desires permanent sterilization.  Other reversible forms of contraception were discussed with patient; she declines all other modalities. We discussed tubal ligation versus salpingectomy and she desires salpingectomy. Risks of procedure discussed with patient including but not limited to: risk of regret, permanence of method, bleeding, infection, injury to surrounding organs and need for additional procedures.  Failure risk of about 1%  with increased risk of ectopic gestation if pregnancy occurs was also discussed with patient.  Also discussed possibility of post-tubal pain syndrome, although likely less with salpingectomy, data is limited. Patient verbalized understanding of these risks and wants to proceed with sterilization.   -Consent Form [id number] signed today   4. Obesity in pregnancy -Pre-gravid BMI 38.02, TWG 10#, appropriate -Baseline PIH labs not done, CMP and UP:C ordered today, pt requesting to complete them at next visit due to time.  -Growth US  ordered for next visit in 2wks -Weekly NSTs starting at 37wks   Orders Placed This Encounter  Procedures   US  OB Follow Up    Standing Status:   Future    Expected Date:   12/08/2023    Expiration Date:   11/23/2024    Reason for Exam (SYMPTOM  OR DIAGNOSIS REQUIRED):   Growth for BMI    Preferred imaging location?:   Internal             at [facility]   Tdap vaccine greater than or equal to 7yo IM   Flu vaccine trivalent PF, 6mos and older(Flulaval,Afluria,Fluarix,Fluzone)   Comp Met (CMET)   Protein / creatinine ratio, urine   Return in about 2 weeks (around 12/08/2023) for ROB w/growth US .   Future Appointments  Date Time Provider Department Center  12/08/2023 11:00 AM Veva Almarie BIRCH, [facility] None  12/09/2023  2:30 PM [facility] US  1 AOB-IMG None  12/09/2023  3:15 PM Swanson, Eleanor HERO, CNM [facility] None    For next visit:  ROB w/growth US  and labs    Estil Mangle, DO [facility]

## 2023-11-24 ENCOUNTER — Other Ambulatory Visit

## 2023-11-24 ENCOUNTER — Encounter: Admitting: Obstetrics

## 2023-11-24 ENCOUNTER — Ambulatory Visit: Admitting: Obstetrics

## 2023-11-24 VITALS — BP 131/74 | HR 85 | Wt 260.5 lb

## 2023-11-24 DIAGNOSIS — Z23 Encounter for immunization: Secondary | ICD-10-CM

## 2023-11-24 DIAGNOSIS — Z3A27 27 weeks gestation of pregnancy: Secondary | ICD-10-CM

## 2023-11-24 DIAGNOSIS — O34219 Maternal care for unspecified type scar from previous cesarean delivery: Secondary | ICD-10-CM

## 2023-11-24 DIAGNOSIS — Z113 Encounter for screening for infections with a predominantly sexual mode of transmission: Secondary | ICD-10-CM

## 2023-11-24 DIAGNOSIS — O0990 Supervision of high risk pregnancy, unspecified, unspecified trimester: Secondary | ICD-10-CM

## 2023-11-24 DIAGNOSIS — O99210 Obesity complicating pregnancy, unspecified trimester: Secondary | ICD-10-CM

## 2023-11-24 DIAGNOSIS — E669 Obesity, unspecified: Secondary | ICD-10-CM

## 2023-11-24 DIAGNOSIS — O99212 Obesity complicating pregnancy, second trimester: Secondary | ICD-10-CM

## 2023-11-24 DIAGNOSIS — Z114 Encounter for screening for human immunodeficiency virus [HIV]: Secondary | ICD-10-CM

## 2023-11-24 DIAGNOSIS — Z302 Encounter for sterilization: Secondary | ICD-10-CM

## 2023-11-24 DIAGNOSIS — Z3480 Encounter for supervision of other normal pregnancy, unspecified trimester: Secondary | ICD-10-CM

## 2023-11-24 DIAGNOSIS — Z130 Encounter for screening for diseases of the blood and blood-forming organs and certain disorders involving the immune mechanism: Secondary | ICD-10-CM

## 2023-11-24 DIAGNOSIS — Z3A23 23 weeks gestation of pregnancy: Secondary | ICD-10-CM

## 2023-11-24 DIAGNOSIS — Z131 Encounter for screening for diabetes mellitus: Secondary | ICD-10-CM

## 2023-11-25 ENCOUNTER — Ambulatory Visit: Payer: Self-pay | Admitting: Certified Nurse Midwife

## 2023-11-25 LAB — 28 WEEK RH+PANEL
Basophils Absolute: 0 x10E3/uL (ref 0.0–0.2)
Basos: 1 %
EOS (ABSOLUTE): 0.1 x10E3/uL (ref 0.0–0.4)
Eos: 1 %
Gestational Diabetes Screen: 115 mg/dL (ref 70–139)
HIV Screen 4th Generation wRfx: NONREACTIVE
Hematocrit: 32.5 % — ABNORMAL LOW (ref 34.0–46.6)
Hemoglobin: 10.2 g/dL — ABNORMAL LOW (ref 11.1–15.9)
Immature Grans (Abs): 0.2 x10E3/uL — ABNORMAL HIGH (ref 0.0–0.1)
Immature Granulocytes: 2 %
Lymphocytes Absolute: 1.3 x10E3/uL (ref 0.7–3.1)
Lymphs: 15 %
MCH: 27.1 pg (ref 26.6–33.0)
MCHC: 31.4 g/dL — ABNORMAL LOW (ref 31.5–35.7)
MCV: 86 fL (ref 79–97)
Monocytes Absolute: 0.5 x10E3/uL (ref 0.1–0.9)
Monocytes: 5 %
Neutrophils Absolute: 6.7 x10E3/uL (ref 1.4–7.0)
Neutrophils: 76 %
Platelets: 260 x10E3/uL (ref 150–450)
RBC: 3.77 x10E6/uL (ref 3.77–5.28)
RDW: 12.9 % (ref 11.7–15.4)
RPR Ser Ql: NONREACTIVE
WBC: 8.7 x10E3/uL (ref 3.4–10.8)

## 2023-11-29 ENCOUNTER — Ambulatory Visit (INDEPENDENT_AMBULATORY_CARE_PROVIDER_SITE_OTHER)

## 2023-11-29 VITALS — BP 122/77 | HR 105 | Ht 68.0 in | Wt 259.8 lb

## 2023-11-29 DIAGNOSIS — R350 Frequency of micturition: Secondary | ICD-10-CM | POA: Diagnosis not present

## 2023-11-29 DIAGNOSIS — R82998 Other abnormal findings in urine: Secondary | ICD-10-CM

## 2023-11-29 LAB — POCT URINALYSIS DIPSTICK OB
Bilirubin, UA: NEGATIVE
Glucose, UA: NEGATIVE
Ketones, UA: NEGATIVE
Nitrite, UA: NEGATIVE
POC,PROTEIN,UA: NEGATIVE
Spec Grav, UA: 1.02 (ref 1.010–1.025)
Urobilinogen, UA: 0.2 U/dL
pH, UA: 6 (ref 5.0–8.0)

## 2023-11-29 MED ORDER — NITROFURANTOIN MONOHYD MACRO 100 MG PO CAPS: 100.0000 mg | ORAL_CAPSULE | Freq: Two times a day (BID) | ORAL | 0 refills | Status: DC

## 2023-11-29 NOTE — Progress Notes (Signed)
    NURSE VISIT NOTE  Subjective:    Patient ID: Ann Deleon, female    DOB: 05-14-1995, 28 y.o.   MRN: [id number]       HPI  Patient is a 28 y.o. G56P1001 female who presents for urinary frequency for 3 days. Patient does have a history of recurrent UTI.  Patient does have a history of pyelonephritis.    Objective:    BP 122/77   Pulse (!) 105   Ht 5' 8 (1.727 m)   Wt 259 lb 12.8 oz (117.8 kg)   LMP 05/12/2023 (Exact Date)   BMI 39.50 kg/m    Lab Review  Results for orders placed or performed in visit on 11/29/23  POC Urinalysis Dipstick OB  Result Value Ref Range   Color, UA yellow    Clarity, UA clear    Glucose, UA Negative Negative   Bilirubin, UA neg    Ketones, UA neg    Spec Grav, UA 1.020 1.010 - 1.025   Blood, UA trace    pH, UA 6.0 5.0 - 8.0   POC,PROTEIN,UA Negative Negative, Trace, Small (1+), Moderate (2+), Large (3+), 4+   Urobilinogen, UA 0.2 0.2 or 1.0 E.U./dL   Nitrite, UA neg    Leukocytes, UA Small (1+) (A) Negative   Appearance     Odor      Assessment:   1. Urine frequency   2. Leukocytes in urine      Plan:   Urine Culture Sent. Treatment  Macrobid  100 mg PO BID for 7 days. Maintain adequate hydration.  May use AZO OTC prn.  Follow up if symptoms worsen or fail to improve as anticipated, and as needed.    Waddell JONELLE Maxim, CMA

## 2023-12-01 LAB — URINE CULTURE

## 2023-12-02 ENCOUNTER — Ambulatory Visit: Payer: Self-pay

## 2023-12-08 ENCOUNTER — Ambulatory Visit: Admitting: Professional Counselor

## 2023-12-08 DIAGNOSIS — F439 Reaction to severe stress, unspecified: Secondary | ICD-10-CM

## 2023-12-08 DIAGNOSIS — F411 Generalized anxiety disorder: Secondary | ICD-10-CM

## 2023-12-08 NOTE — Progress Notes (Signed)
  THERAPIST PROGRESS NOTE  Session Time: 11:00 AM - 12:02 PM   Participation Level: Active  Behavioral Response: Casual, Alert, Dysphoric  Type of Therapy: Individual Therapy  Treatment Goals addressed: Active Anxiety  LTG: Maybe more trust. Maybe stop over-thinking. (Not Progressing)                Start:  06/03/23    Expected End:  06/01/24    Goal Note Reviewed 09/28/23 That ain't got nowhere.    STG: I think it's letting (daughter) go places without me or my husband and knowing she will be fine. To reduce sxs of anxiety AEB using grounding techniques and restructuring maladaptive patterns of thinking over the next 90 days. (Not Progressing)    Goal Note Reviewed 09/28/23 No where. I think just not being able to control what she's doing.   STG: I think I look too far into situations. To improve mindfulness AEB utilizing mindfulness exercises and focusing on what's within her control over the next 90 days.  (Progressing)    Goal Note Reviewed 09/28/23 - Not sure. I try to focus on the moment but then sometimes it just gets away.   ProgressTowards Goals: Progressing  Interventions: CBT, Motivational Interviewing, Conservator, museum/gallery, and Supportive  Summary: JUSTISE EHMANN is a 28 y.o. female who presents with a history of anxiety and trauma. She appeared alert and oriented x5. She stated she got into it with her mother. Micheline shared what their verbal altercation was able. She discussed how this situation is similar to other experiences she's had with her mother in the past. She weighed thoughts on whether or not she wants to maintain a relationship with her mother. Lenda also discussed other stressors. She was receptive to how her behaviors may be trauma responses. She was tearful as she discussed her struggle with letting go of control and hyper-independence.   Therapist Response: Conducted session with [place]. Began session with check-in/update since previous  session. Utilized empathetic and reflective listening. Used open-ended questions to facilitate discussion and summarized Wendelin's thoughts/feelings. Highlighted Miyoko's struggle with anxiety, control, and hyper-independence. Scheduled additional appointment and concluded session.   Suicidal/Homicidal: No  Plan: Return again in 2 weeks.  Diagnosis: GAD (generalized anxiety disorder)  Trauma and stressor-related disorder  Collaboration of Care: Medication Management AEB chart review  Patient/Guardian was advised Release of Information must be obtained prior to any record release in order to collaborate their care with an outside provider. Patient/Guardian was advised if they have not already done so to contact the registration department to sign all necessary forms in order for us  to release information regarding their care.   Consent: Patient/Guardian gives verbal consent for treatment and assignment of benefits for services provided during this visit. Patient/Guardian expressed understanding and agreed to proceed.   [name], [facility] 12/08/2023

## 2023-12-09 ENCOUNTER — Ambulatory Visit (INDEPENDENT_AMBULATORY_CARE_PROVIDER_SITE_OTHER): Admitting: Obstetrics

## 2023-12-09 ENCOUNTER — Other Ambulatory Visit (HOSPITAL_COMMUNITY)
Admission: RE | Admit: 2023-12-09 | Discharge: 2023-12-09 | Disposition: A | Discharge status: Home / Self Care | Source: Ambulatory Visit | Attending: Obstetrics | Admitting: Obstetrics

## 2023-12-09 ENCOUNTER — Ambulatory Visit

## 2023-12-09 ENCOUNTER — Encounter: Payer: Self-pay | Admitting: Obstetrics

## 2023-12-09 VITALS — BP 113/73 | HR 80 | Wt 265.0 lb

## 2023-12-09 DIAGNOSIS — Z3A30 30 weeks gestation of pregnancy: Secondary | ICD-10-CM | POA: Diagnosis not present

## 2023-12-09 DIAGNOSIS — O34219 Maternal care for unspecified type scar from previous cesarean delivery: Secondary | ICD-10-CM

## 2023-12-09 DIAGNOSIS — O99213 Obesity complicating pregnancy, third trimester: Secondary | ICD-10-CM

## 2023-12-09 DIAGNOSIS — O99210 Obesity complicating pregnancy, unspecified trimester: Secondary | ICD-10-CM

## 2023-12-09 DIAGNOSIS — O0990 Supervision of high risk pregnancy, unspecified, unspecified trimester: Secondary | ICD-10-CM

## 2023-12-09 DIAGNOSIS — E669 Obesity, unspecified: Secondary | ICD-10-CM

## 2023-12-09 DIAGNOSIS — Z3A29 29 weeks gestation of pregnancy: Secondary | ICD-10-CM

## 2023-12-09 DIAGNOSIS — R350 Frequency of micturition: Secondary | ICD-10-CM

## 2023-12-09 NOTE — Assessment & Plan Note (Addendum)
-  Growth US  today. Per preliminary report, EFW 52%ile, AFI 12.23 -Urine culture sent. Discussed increasing H2O, decreasing caffeine intake, belly tape, positioning when voiding -Reviewed kick counts and preterm labor warning signs. Instructed to call office or come to hospital with persistent headache, vision changes, regular contractions, leaking of fluid, decreased fetal movement or vaginal bleeding.

## 2023-12-09 NOTE — Progress Notes (Signed)
    Return Prenatal Note   Assessment/Plan   Plan  28 y.o. G2P1001 at [redacted]w[redacted]d presents for follow-up OB visit. Reviewed prenatal record including previous visit note.  Previous cesarean delivery affecting pregnancy -Awaiting CS scheduling. Ann Deleon has more questions about BTL and IVF. Will schedule next visit with MD to discuss.  Supervision of high risk pregnancy, antepartum -Growth US  today. Per preliminary report, EFW 52%ile, AFI 12.23 -Urine culture sent. Discussed increasing H2O, decreasing caffeine intake, belly tape, positioning when voiding -Reviewed kick counts and preterm labor warning signs. Instructed to call office or come to hospital with persistent headache, vision changes, regular contractions, leaking of fluid, decreased fetal movement or vaginal bleeding.     Orders Placed This Encounter  Procedures   Urine Culture   POC Urinalysis Dipstick OB   Return in about 2 weeks (around 12/23/2023).   Future Appointments  Date Time Provider Department Center  12/21/2023  3:35 PM Ann Harland BROCKS, MD [facility] None  12/21/2023  4:00 PM Ann Deleon, [facility] None    For next visit:  Routine prenatal care    Subjective  Ann Deleon is having trouble sleeping d/t discomfort (usually a stomach sleeper!). Unisom  makes her too groggy. She is feeling like she never empties her bladder completely. She has questions about the potential IVF after a BTL.   Movement: Present Contractions: Not present  Objective   Flow sheet Vitals: Pulse Rate: 80 BP: 113/73 Fundal Height: 29 cm Fetal Heart Rate (bpm): 134 Total weight gain: 15 lb (6.804 kg)  General Appearance  No acute distress, well appearing, and well nourished Pulmonary   Normal work of breathing Neurologic   Alert and oriented to person, place, and time Psychiatric   Mood and affect within normal limits  Ann Deleon, CNM 12/09/23 3:59 PM

## 2023-12-09 NOTE — Assessment & Plan Note (Signed)
-  Awaiting CS scheduling. Nadalie has more questions about BTL and IVF. Will schedule next visit with MD to discuss.

## 2023-12-10 LAB — PROTEIN / CREATININE RATIO, URINE
Creatinine, Urine: 140.7 mg/dL
Protein, Ur: 29.5 mg/dL
Protein/Creat Ratio: 210 mg/g{creat} — ABNORMAL HIGH (ref 0–200)

## 2023-12-10 LAB — COMPREHENSIVE METABOLIC PANEL WITH GFR
ALT: 18 IU/L (ref 0–32)
AST: 19 IU/L (ref 0–40)
Albumin: 3.7 g/dL — ABNORMAL LOW (ref 4.0–5.0)
Alkaline Phosphatase: 153 IU/L — ABNORMAL HIGH (ref 41–116)
BUN/Creatinine Ratio: 12 (ref 9–23)
BUN: 7 mg/dL (ref 6–20)
Bilirubin Total: 0.3 mg/dL (ref 0.0–1.2)
CO2: 18 mmol/L — ABNORMAL LOW (ref 20–29)
Calcium: 9.1 mg/dL (ref 8.7–10.2)
Chloride: 100 mmol/L (ref 96–106)
Creatinine, Ser: 0.59 mg/dL (ref 0.57–1.00)
Globulin, Total: 2.7 g/dL (ref 1.5–4.5)
Glucose: 67 mg/dL — ABNORMAL LOW (ref 70–99)
Potassium: 4.3 mmol/L (ref 3.5–5.2)
Sodium: 135 mmol/L (ref 134–144)
Total Protein: 6.4 g/dL (ref 6.0–8.5)
eGFR: 127 mL/min/1.73 (ref >59)

## 2023-12-11 ENCOUNTER — Ambulatory Visit: Payer: Self-pay | Admitting: Obstetrics

## 2023-12-11 LAB — URINE CULTURE

## 2023-12-13 ENCOUNTER — Encounter: Payer: Self-pay | Admitting: Obstetrics

## 2023-12-13 ENCOUNTER — Other Ambulatory Visit: Payer: Self-pay | Admitting: Obstetrics

## 2023-12-13 DIAGNOSIS — B9689 Other specified bacterial agents as the cause of diseases classified elsewhere: Secondary | ICD-10-CM | POA: Insufficient documentation

## 2023-12-13 LAB — CERVICOVAGINAL ANCILLARY ONLY
Bacterial Vaginitis (gardnerella): POSITIVE — AB
Candida Glabrata: NEGATIVE
Candida Vaginitis: NEGATIVE
Comment: NEGATIVE
Comment: NEGATIVE
Comment: NEGATIVE

## 2023-12-13 MED ORDER — METRONIDAZOLE 500 MG PO TABS: 500.0000 mg | ORAL_TABLET | Freq: Two times a day (BID) | ORAL | 0 refills | Status: DC

## 2023-12-13 NOTE — Progress Notes (Signed)
 Your swab came back positive for bacterial vaginosis (BV). The treatment for this is an antibiotic called metronidazole  twice a day for a week. I sent a prescription to your pharmacy. Please let me know if you have any questions.  Take care,  Missy

## 2023-12-21 ENCOUNTER — Ambulatory Visit: Admitting: Professional Counselor

## 2023-12-21 ENCOUNTER — Ambulatory Visit: Admitting: Obstetrics & Gynecology

## 2023-12-21 VITALS — BP 117/75 | HR 98 | Wt 261.3 lb

## 2023-12-21 DIAGNOSIS — O0993 Supervision of high risk pregnancy, unspecified, third trimester: Secondary | ICD-10-CM

## 2023-12-21 DIAGNOSIS — Z3A31 31 weeks gestation of pregnancy: Secondary | ICD-10-CM

## 2023-12-21 DIAGNOSIS — O99213 Obesity complicating pregnancy, third trimester: Secondary | ICD-10-CM

## 2023-12-21 DIAGNOSIS — O0990 Supervision of high risk pregnancy, unspecified, unspecified trimester: Secondary | ICD-10-CM

## 2023-12-21 DIAGNOSIS — O34219 Maternal care for unspecified type scar from previous cesarean delivery: Secondary | ICD-10-CM

## 2023-12-21 DIAGNOSIS — E669 Obesity, unspecified: Secondary | ICD-10-CM | POA: Diagnosis not present

## 2023-12-21 DIAGNOSIS — Z3009 Encounter for other general counseling and advice on contraception: Secondary | ICD-10-CM | POA: Insufficient documentation

## 2023-12-21 NOTE — Progress Notes (Signed)
 PRENATAL VISIT NOTE  Subjective:  Ann Deleon is a 28 y.o. G2P1001 at [redacted]w[redacted]d being seen today for ongoing prenatal care.  She is currently monitored for the following issues for this low-risk pregnancy and has Asthma, mild persistent; Dermatitis, eczematoid; Anxiety; Allergic rhinitis due to pollen; Recurrent cold sores; History of concussion; GAD (generalized anxiety disorder); Obesity (BMI 30.0-34.9); Recurrent major depressive disorder, in full remission; Previous cesarean delivery affecting pregnancy; LGSIL on Pap smear of cervix; Benign paroxysmal positional vertigo; Dizziness; Post concussion syndrome; PTSD (post-traumatic stress disorder); History of depression; High risk medication use; Supervision of high risk pregnancy, antepartum; Obesity (BMI 35.0-39.9 without comorbidity); HPV (human papilloma virus) anogenital infection; BV (bacterial vaginosis); and Unwanted fertility on their problem list.  Patient reports no complaints.  Contractions: Not present. Vag. Bleeding: None.  Movement: Present. Denies leaking of fluid.   The following portions of the patient's history were reviewed and updated as appropriate: allergies, current medications, past family history, past medical history, past social history, past surgical history and problem list.   Objective:   Vitals:   12/21/23 1528  BP: 117/75  Pulse: 98  Weight: 261 lb 4.8 oz (118.5 kg)    Fetal Status:  Fetal Heart Rate (bpm): 158 Fundal Height: 31 cm Movement: Present    General: Alert, oriented and cooperative. Patient is in no acute distress.  Skin: Skin is warm and dry. No rash noted.   Cardiovascular: Normal heart rate noted  Respiratory: Normal respiratory effort, no problems with respiration noted  Abdomen: Soft, gravid, appropriate for gestational age.  Pain/Pressure: Absent     Pelvic: Cervical exam performed in the presence of a chaperone Dilation: Closed Effacement (%): Thick Station: Ballotable  Extremities:  Normal range of motion.  Edema: Trace  Mental Status: Normal mood and affect. Normal behavior. Normal judgment and thought content.      04/12/2023    1:04 PM 11/18/2022    9:53 AM 10/01/2021    3:24 PM  Depression screen PHQ 2/9  Decreased Interest   0  Down, Depressed, Hopeless   1  PHQ - 2 Score   1  Altered sleeping   1  Tired, decreased energy   0  Change in appetite   1  Feeling bad or failure about yourself    0  Trouble concentrating   0  Moving slowly or fidgety/restless   0  Suicidal thoughts   0  PHQ-9 Score   3  Difficult doing work/chores   Not difficult at all     Information is confidential and restricted. Go to Review Flowsheets to unlock data.        04/12/2023    1:03 PM 11/18/2022    9:54 AM 10/01/2021    3:24 PM 05/13/2018    3:00 PM  GAD 7 : Generalized Anxiety Score  Nervous, Anxious, on Edge   2 2  Control/stop worrying   2 2  Worry too much - different things   2 2  Trouble relaxing   1 1  Restless   1 0  Easily annoyed or irritable   2 1  Afraid - awful might happen   1 1  Total GAD 7 Score   11 9  Anxiety Difficulty   Not difficult at all Not difficult at all     Information is confidential and restricted. Go to Review Flowsheets to unlock data.    Assessment and Plan:  Pregnancy: G2P1001 at [redacted]w[redacted]d 1. Supervision of high risk  pregnancy, antepartum (Primary) - doing well  2. Obesity (BMI 35.0-39.9 without comorbidity)   3. Previous cesarean delivery affecting pregnancy - She opts for RLTCS and BTL, requesting 02/14/2024  4. Unwanted fertility - She understands the permanence of this procedure. - She signed MCD forms 11/24/2023  5. [redacted] weeks gestation of pregnancy   Preterm labor symptoms and general obstetric precautions including but not limited to vaginal bleeding, contractions, leaking of fluid and fetal movement were reviewed in detail with the patient. Please refer to After Visit Summary for other counseling recommendations.    Return in about 2 weeks (around 01/04/2024).  Future Appointments  Date Time Provider Department Center  01/07/2024 11:00 AM Veva Almarie [facility], [facility] None    [name], MD

## 2024-01-04 ENCOUNTER — Ambulatory Visit: Admitting: Certified Nurse Midwife

## 2024-01-04 VITALS — BP 128/81 | HR 84 | Wt 263.3 lb

## 2024-01-04 DIAGNOSIS — Z3483 Encounter for supervision of other normal pregnancy, third trimester: Secondary | ICD-10-CM

## 2024-01-04 DIAGNOSIS — Z3A33 33 weeks gestation of pregnancy: Secondary | ICD-10-CM

## 2024-01-04 NOTE — Patient Instructions (Signed)
 Early Labor (Pre-Term Labor): What to Know Pregnancy normally lasts 39-41 weeks. Pre-term labor is when labor starts before you have been pregnant for 37 weeks. Babies who are born too early may have a higher risk for long-term problems like cerebral palsy or developmental delays. Premature babies may also have problems soon after birth, such as problems with their blood sugar, body temperature, heart, and breathing. These babies often have trouble with feeding.These problems may be very serious in babies who are born before 34 weeks of pregnancy. What are the causes? The cause of pre-term labor is not known. What increases the risk? You're more likely to have pre-term labor if: You have medical problems, now or in the past. You have problems now or in your past pregnancies. You have risk factors related to lifestyle, environment, and age. Medical history You have problems affecting your uterus, including a short cervix. You have a sexually transmitted infections (STI) or other infections of the urinary tract and the vagina. You have: Blood clotting problems. High blood pressure. High blood sugar. You have a low body weight or too much body weight. Present and past pregnancies You have had pre-term labor before. You're pregnant with more than one baby. The placenta covers your cervix,. This is called placenta previa. Your unborn baby has a congenital condition. This means your baby will be born with the condition. You have bleeding from your vagina while you're pregnant. You became pregnant by a method called IVF. Lifestyle and environmental factors You smoke. You drink alcohol. You use drugs. You have stress and no social support. You have abuse in your home. You're exposed to certain chemicals at home or work. Other factors You're younger than age 26 or older than age 26. What are the signs or symptoms? Cramps like those that can happen during a menstrual period. The cramps may  happen with diarrhea, which is watery poop. Pain in the belly. Pain in the lower back. Regular contractions. It may feel like your belly is getting tighter. Pressure in your pelvis. More fluid leaking from the vagina. The fluid may be watery or bloody. Your water breaking. How is this treated? Treatment depends on your health, the health of your baby, and how far along your pregnancy is. It may include: Taking: Medicines to stop contractions. Medicines to help the baby's lungs mature. Medicines to help prevent your baby from having cerebral palsy or other problems. Bed rest. If the labor happens before 34 weeks of pregnancy, you may need to stay in the hospital. Delivering the baby. Follow these instructions at home: Do not smoke, vape, or use nicotine or tobacco. Do not drink alcohol. Take your medicines only as told. Rest as told by your doctor. Ask what things are safe for you to do at home. Ask when you can go back to work or school. Keep all follow-up visits. Your provider will need to closely follow your health and the health of your baby. How is this prevented? To increase your chance of having a full-term pregnancy: Do not use drugs. Do not use any medicines unless you ask your doctor if they are safe for you. Talk with your doctor before taking any herbal supplements. Gain a healthy amount of weight during your pregnancy. Watch for infection. If you think you might have an infection, get it checked right away. Symptoms of infection may include: Fever. Discharge from your vagina that smells bad or is not normal. Pain or burning when you pee. Having to pee  small amounts or very often. Blood in your pee. Where to find more information To learn more, go to these websites: U.S. Department of Health and Cytogeneticist on Women's Health: http://hoffman.com/ [employer]: www.acog.org Centers for Disease Control and Prevention  at DiningCalendar.de. Then: Click "Search" and type "preterm labor." Find the link you need. Contact a doctor if: You think you're going into pre-term labor. You have symptoms of pre-term labor. You have symptoms of infection. Get help right away if: You're having painful contractions every 5 minutes or less. Your water breaks. This information is not intended to replace advice given to you by your health care provider. Make sure you discuss any questions you have with your health care provider. Document Revised: 08/13/2022 Document Reviewed: 08/13/2022 Elsevier Patient Education  2024 [employer].

## 2024-01-04 NOTE — Progress Notes (Signed)
 Rob doing well, feeling good movement. Denies any concerns, states she has pelvic discomfort. Recommend belly band. She states she can't wear it because it is also uncomfortable . She has tried KT tape. She did see a chiropractor for a while but feels like it was not much help. She declines referral for PT. Discussed gbs swabs  at 36 weeks. Follow up 2 wks for ROB.   Zelda Hummer, CNM

## 2024-01-07 ENCOUNTER — Ambulatory Visit (INDEPENDENT_AMBULATORY_CARE_PROVIDER_SITE_OTHER): Admitting: Professional Counselor

## 2024-01-07 DIAGNOSIS — F411 Generalized anxiety disorder: Secondary | ICD-10-CM | POA: Diagnosis not present

## 2024-01-07 DIAGNOSIS — F439 Reaction to severe stress, unspecified: Secondary | ICD-10-CM | POA: Diagnosis not present

## 2024-01-07 NOTE — Progress Notes (Unsigned)
  THERAPIST PROGRESS NOTE  Virtual Visit via Video Note  I connected with Ann Deleon on 01/07/24 at 11:00 AM EST by a video enabled telemedicine application and verified that I am speaking with the correct person using two identifiers.  Location: Patient: Community (parked car) Provider: Remote office   I discussed the limitations of evaluation and management by telemedicine and the availability of in person appointments. The patient expressed understanding and agreed to proceed.   I discussed the assessment and treatment plan with the patient. The patient was provided an opportunity to ask questions and all were answered. The patient agreed with the plan and demonstrated an understanding of the instructions.   The patient was advised to call back or seek an in-person evaluation if the symptoms worsen or if the condition fails to improve as anticipated.  I provided 28 minutes of non-face-to-face time during this encounter. Ann Deleon, [facility]  Session Time: 11:02 AM - 11:30 AM   Participation Level: Active  Behavioral Response: Casual, Alert, Anxious and Dysphoric  Type of Therapy: Individual Therapy  Treatment Goals addressed: Active Anxiety  LTG: Maybe more trust. Maybe stop over-thinking. (Not Progressing)                Start:  06/03/23    Expected End:  06/01/24    Goal Note Reviewed 09/28/23 That ain't got nowhere.    STG: I think it's letting (daughter) go places without me or my husband and knowing she will be fine. To reduce sxs of anxiety AEB using grounding techniques and restructuring maladaptive patterns of thinking over the next 90 days. (Not Progressing)    Goal Note Reviewed 09/28/23 No where. I think just not being able to control what she's doing.   STG: I think I look too far into situations. To improve mindfulness AEB utilizing mindfulness exercises and focusing on what's within her control over the next 90 days.  (Progressing)    Goal  Note Reviewed 09/28/23 - Not sure. I try to focus on the moment but then sometimes it just gets away.   ProgressTowards Goals: Progressing  Interventions: Motivational Interviewing and Supportive  Summary: Ann Deleon is a 28 y.o. female who presents with a history of anxiety and trauma. She appeared somber but oriented x5. She reported her mother sort of apologized, but she didn't feel like it was genuine. Ann Deleon took her daughter to visit her mother but she is still keeping her guard up. She discussed upcoming plans for the holidays.   Therapist Response: Conducted session with Ann Deleon. Began session with check-in/update since previous session. Utilized empathetic and reflective listening. Used open-ended questions to facilitate discussion and summarized Ann Deleon's thoughts/feelings. Scheduled additional appointment and concluded session.   Suicidal/Homicidal: No  Plan: Ann Deleon will call back to schedule next appointment.  Diagnosis: GAD (generalized anxiety disorder)  Trauma and stressor-related disorder  Collaboration of Care: Medication Management AEB chart review  Patient/Guardian was advised Release of Information must be obtained prior to any record release in order to collaborate their care with an outside provider. Patient/Guardian was advised if they have not already done so to contact the registration department to sign all necessary forms in order for us  to release information regarding their care.   Consent: Patient/Guardian gives verbal consent for treatment and assignment of benefits for services provided during this visit. Patient/Guardian expressed understanding and agreed to proceed.   Ann Deleon, [facility] 01/07/2024

## 2024-01-18 ENCOUNTER — Ambulatory Visit: Admitting: Obstetrics & Gynecology

## 2024-01-18 VITALS — BP 131/89 | HR 96 | Wt 267.0 lb

## 2024-01-18 DIAGNOSIS — O99213 Obesity complicating pregnancy, third trimester: Secondary | ICD-10-CM

## 2024-01-18 DIAGNOSIS — O34219 Maternal care for unspecified type scar from previous cesarean delivery: Secondary | ICD-10-CM

## 2024-01-18 DIAGNOSIS — Z3009 Encounter for other general counseling and advice on contraception: Secondary | ICD-10-CM

## 2024-01-18 DIAGNOSIS — O0990 Supervision of high risk pregnancy, unspecified, unspecified trimester: Secondary | ICD-10-CM

## 2024-01-18 DIAGNOSIS — O99210 Obesity complicating pregnancy, unspecified trimester: Secondary | ICD-10-CM | POA: Insufficient documentation

## 2024-01-18 DIAGNOSIS — Z3A35 35 weeks gestation of pregnancy: Secondary | ICD-10-CM

## 2024-01-18 DIAGNOSIS — E669 Obesity, unspecified: Secondary | ICD-10-CM

## 2024-01-18 NOTE — Progress Notes (Signed)
 PRENATAL VISIT NOTE  Subjective:  Ann Deleon is a 28 y.o. G2P1001 at [redacted]w[redacted]d being seen today for ongoing prenatal care.  She is currently monitored for the following issues for this low-risk pregnancy and has Asthma, mild persistent; Dermatitis, eczematoid; Anxiety; Allergic rhinitis due to pollen; Recurrent cold sores; History of concussion; GAD (generalized anxiety disorder); Recurrent major depressive disorder, in full remission; Previous cesarean delivery affecting pregnancy; LGSIL on Pap smear of cervix; Benign paroxysmal positional vertigo; Dizziness; Post concussion syndrome; PTSD (post-traumatic stress disorder); History of depression; High risk medication use; Supervision of high risk pregnancy, antepartum; HPV (human papilloma virus) anogenital infection; BV (bacterial vaginosis); Unwanted fertility; and Obesity in pregnancy on their problem list.  Patient reports pelvic/LBP. She saw a chiro and this didn't help. She tried KT tape with no help.  Contractions: Irritability. Vag. Bleeding: None.  Movement: Present. Denies leaking of fluid.   The following portions of the patient's history were reviewed and updated as appropriate: allergies, current medications, past family history, past medical history, past social history, past surgical history and problem list.   Objective:   Vitals:   01/18/24 1539  BP: 131/89  Pulse: 96  Weight: 267 lb (121.1 kg)    Fetal Status:  Fetal Heart Rate (bpm): 35 Fundal Height: 146 cm Movement: Present Presentation: Vertex  General: Alert, oriented and cooperative. Patient is in no acute distress.  Skin: Skin is warm and dry. No rash noted.   Cardiovascular: Normal heart rate noted  Respiratory: Normal respiratory effort, no problems with respiration noted  Abdomen: Soft, gravid, appropriate for gestational age.  Pain/Pressure: Present     Pelvic: Cervical exam performed in the presence of a chaperone        Extremities: Normal range of  motion.  Edema: None  Mental Status: Normal mood and affect. Normal behavior. Normal judgment and thought content.      04/12/2023    1:04 PM 11/18/2022    9:53 AM 10/01/2021    3:24 PM  Depression screen PHQ 2/9  Decreased Interest   0  Down, Depressed, Hopeless   1  PHQ - 2 Score   1  Altered sleeping   1  Tired, decreased energy   0  Change in appetite   1  Feeling bad or failure about yourself    0  Trouble concentrating   0  Moving slowly or fidgety/restless   0  Suicidal thoughts   0  PHQ-9 Score   3   Difficult doing work/chores   Not difficult at all     Information is confidential and restricted. Go to Review Flowsheets to unlock data.   Data saved with a previous flowsheet row definition        04/12/2023    1:03 PM 11/18/2022    9:54 AM 10/01/2021    3:24 PM 05/13/2018    3:00 PM  GAD 7 : Generalized Anxiety Score  Nervous, Anxious, on Edge   2 2  Control/stop worrying   2 2  Worry too much - different things   2 2  Trouble relaxing   1 1  Restless   1 0  Easily annoyed or irritable   2 1  Afraid - awful might happen   1 1  Total GAD 7 Score   11 9  Anxiety Difficulty   Not difficult at all Not difficult at all     Information is confidential and restricted. Go to Review Flowsheets to unlock data.  Assessment and Plan:  Pregnancy: G2P1001 at [redacted]w[redacted]d 1. Previous cesarean delivery affecting pregnancy (Primary) - planning a RLTC on 02/14/2024  2. Supervision of high risk pregnancy, antepartum   3. Unwanted fertility - planning BTL with RCS, MCD forms signed  4. Obesity in pregnancy   5. [redacted] weeks gestation of pregnancy - cervical cultures at next visit  Preterm labor symptoms and general obstetric precautions including but not limited to vaginal bleeding, contractions, leaking of fluid and fetal movement were reviewed in detail with the patient. Please refer to After Visit Summary for other counseling recommendations.   Return in about 1 week (around  01/25/2024) for ROB every week.  No future appointments.  [name], MD

## 2024-01-28 ENCOUNTER — Other Ambulatory Visit (HOSPITAL_COMMUNITY)
Admission: RE | Admit: 2024-01-28 | Discharge: 2024-01-28 | Disposition: A | Source: Ambulatory Visit | Attending: Registered Nurse | Admitting: Registered Nurse

## 2024-01-28 ENCOUNTER — Ambulatory Visit: Admitting: Registered Nurse

## 2024-01-28 VITALS — BP 119/86 | HR 101 | Wt 267.6 lb

## 2024-01-28 DIAGNOSIS — B001 Herpesviral vesicular dermatitis: Secondary | ICD-10-CM

## 2024-01-28 DIAGNOSIS — Z23 Encounter for immunization: Secondary | ICD-10-CM

## 2024-01-28 DIAGNOSIS — O99210 Obesity complicating pregnancy, unspecified trimester: Secondary | ICD-10-CM

## 2024-01-28 DIAGNOSIS — Z3A36 36 weeks gestation of pregnancy: Secondary | ICD-10-CM

## 2024-01-28 DIAGNOSIS — O0993 Supervision of high risk pregnancy, unspecified, third trimester: Secondary | ICD-10-CM

## 2024-01-28 DIAGNOSIS — Z113 Encounter for screening for infections with a predominantly sexual mode of transmission: Secondary | ICD-10-CM

## 2024-01-28 DIAGNOSIS — O0990 Supervision of high risk pregnancy, unspecified, unspecified trimester: Secondary | ICD-10-CM

## 2024-01-28 DIAGNOSIS — Z3685 Encounter for antenatal screening for Streptococcus B: Secondary | ICD-10-CM

## 2024-01-28 DIAGNOSIS — Z2911 Encounter for prophylactic immunotherapy for respiratory syncytial virus (RSV): Secondary | ICD-10-CM

## 2024-01-28 MED ORDER — VALACYCLOVIR HCL 1 G PO TABS: 1000.0000 mg | ORAL_TABLET | Freq: Every day | ORAL | 3 refills | Status: AC

## 2024-01-28 NOTE — Assessment & Plan Note (Signed)
 Rx for valtrex  sent to pharmacy on file. Encouraged daily suppression prior to birth.

## 2024-01-28 NOTE — Assessment & Plan Note (Signed)
 NST today. Repeat NST weekly Has repeat C/S scheduled 12/29 with Dr. Starla.

## 2024-01-28 NOTE — Patient Instructions (Signed)
 RSV Vaccine: What You Need to Know Many vaccine information statements are available in Spanish and other languages. See ClassThemes.se. 1. Why get vaccinated? RSV vaccine can prevent lower respiratory tract disease caused by respiratory syncytial virus (RSV). RSV is a common respiratory virus that usually causes mild, cold-like symptoms. RSV can cause illness in people of all ages but may be especially serious for infants and older adults. RSV is the most common cause of hospitalization in [place] infants. Infants up to 36 months of age (especially those 6 months and younger) and children who were born prematurely, or who have chronic lung or heart disease, or a weakened immune system, are at increased risk of severe RSV disease. RSV infections can be dangerous for certain adults. Adults at highest risk for severe RSV disease include older adults, especially those with chronic heart or lung disease, a weakened immune system, certain other chronic medical conditions, or who live in nursing homes. RSV spreads through direct contact with the virus, such as when droplets from an infected person's cough or sneeze contact your eyes, nose, or mouth. It can also be spread by someone touching a surface, such as a doorknob, that has the virus on it, and then touching your face. Symptoms of RSV infection may include runny nose, decreased appetite, coughing, sneezing, fever, or wheezing. In very young infants, symptoms of RSV may also include irritability (fussiness), decreased activity, or apnea (pauses in breathing for more than 10 seconds). Most people recover in a week or two, but RSV can be more serious, resulting in shortness of breath and low oxygen levels. RSV can cause bronchiolitis (inflammation of the small airways in the lung) and pneumonia (infection of the lungs). RSV can also lead to worsening of other medical conditions such as asthma, chronic obstructive pulmonary disease (a chronic disease of the  lungs that makes it hard to breathe), or heart failure (when the heart cannot pump enough blood and oxygen throughout the body). Infants and older adults who get very sick from RSV may need to be hospitalized. Some may even die. 2. RSV vaccine There are two immunization options available for protecting infants against RSV: maternal vaccine for the pregnant person or preventive antibodies given to the baby. Only one of these options is needed for most babies to be protected. CDC recommends a one-time dose of RSV vaccine for pregnant people from week 32 through week 36 of pregnancy for the prevention of RSV disease in their infants during the first 6 months of life. This vaccine is recommended to be given from September through January for most of the [place] . However, in some locations (for example, the territories, [place] , [place] , and parts of [place] ), the timing of vaccination may differ based on the time of year when RSV circulates in the area. CDC recommends a one-time-dose of RSV vaccine for everyone 75 years and older and for adults 66 through 26 years of age who are at increased risk of severe RSV disease. Adults 63 through 26 years old who are at increased risk include those with chronic heart or lung disease, a weakened immune system, or certain other chronic medical conditions, and those who are residents of nursing homes. RSV vaccine may be given at the same time as other vaccines. 3. Talk with your health care provider Tell your vaccination provider if the person getting the vaccine: Has had an allergic reaction after a previous dose of RSV vaccine, or has any severe, life-threatening allergies In some cases, your health  care provider may decide to postpone RSV vaccination until a future visit.  People with minor illnesses, such as a cold, may be vaccinated. People who are moderately or severely ill should usually wait until they recover before getting RSV vaccine.  Your health care  provider can give you more information. 4. Risks of a vaccine reaction Pain, redness, and swelling where the shot is given, fatigue (feeling tired), fever, headache, nausea, diarrhea, and muscle or joint pain can happen after RSV vaccination. Serious neurologic conditions, including Guillain-Barr syndrome (GBS), have been reported after RSV vaccination in some older adults. At this time, an increased risk of GBS following RSV vaccine among persons aged 39 years and older cannot be confirmed or ruled out. Preterm birth and high blood pressure during pregnancy, including pre-eclampsia, have been reported among pregnant people who received RSV vaccine. It is unclear whether these events were caused by the vaccine. People sometimes faint after medical procedures, including vaccination. Tell your provider if you feel dizzy or have vision changes or ringing in the ears.  As with any medicine, there is a very remote chance of a vaccine causing a severe allergic reaction, other serious injury, or death. V-Safe is a safety monitoring system that lets you share with CDC how you, or your dependent, feel after getting RSV vaccine. You can find information and enroll in V-Safe RadarLocations.no. 5. What if there is a serious problem? An allergic reaction could occur after the vaccinated person leaves the clinic. If you see signs of a severe allergic reaction (hives, swelling of the face and throat, difficulty breathing, a fast heartbeat, dizziness, or weakness), call 9-1-1 and get the person to the nearest hospital. For other signs that concern you, call your health care provider.  Adverse reactions should be reported to the Vaccine Adverse Event Reporting System (VAERS). Your health care provider will usually file this report, or you can do it yourself. Visit the VAERS website at www.vaers.LAgents.no or call 937-036-2828. VAERS is only for reporting reactions, and VAERS staff members do not give medical advice. 6. How  can I learn more? Ask your health care provider. Call your local or state health department. Visit the website of the Food and Drug Administration (FDA) for vaccine package inserts and additional information at FinderList.no Contact the Centers for Disease Control and Prevention (CDC): Call 256-431-3537 (1-800-CDC-INFO) or Visit CDC's vaccine website at PicCapture.uy Source: CDC Vaccine Information Statement RSV (Respiratory Syncytial Virus) Vaccine (12/03/2022) This same material is available at TonerPromos.no for no charge. This information is not intended to replace advice given to you by your health care provider. Make sure you discuss any questions you have with your health care provider. Document Revised: 12/29/2022 Document Reviewed: 02/18/2022 Elsevier Patient Education  2024 [employer].

## 2024-01-28 NOTE — Progress Notes (Signed)
° ° °  Return Prenatal Note   Subjective   28 y.o. G2P1001 at [redacted]w[redacted]d presents for this follow-up prenatal visit.  Valtrex  (hx cold sores): Not taking. 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 Needs new Rx.  Patient c/o lots of pelvic pressure. Just had baby shower last weekend.  Patient reports: Movement: Present Contractions: Not present  Objective   Flow sheet Vitals: Pulse Rate: (!) 101 BP: 119/86 Fundal Height: 36 cm Fetal Heart Rate (bpm): 135 Presentation: Vertex Total weight gain: 7.983 kg  General Appearance  No acute distress, well appearing, and well nourished Pulmonary   Normal work of breathing Neurologic   Alert and oriented to person, place, and time Psychiatric   Mood and affect within normal limits   Assessment/Plan   Plan  28 y.o. G2P1001 at [redacted]w[redacted]d presents for follow-up OB visit. Reviewed prenatal record including previous visit note.  Obesity in pregnancy NST weekly Has repeat C/S scheduled 12/29 with Dr. Starla.      Orders Placed This Encounter  Procedures   Strep Gp B NAA   Respiratory syncytial virus vaccine, preF, subunit, bivalent,(Abrysvo)   No follow-ups on file.   Future Appointments  Date Time Provider Department Center  02/04/2024  1:15 PM [facility] ROOM [facility] None  02/04/2024  1:55 PM Justino Setter M, CNM [facility] None  02/08/2024  1:30 PM [facility] None  02/11/2024  1:00 PM [facility] ROOM [facility] None  02/11/2024  1:15 PM Jayne Harlene CROME, CNM [facility] None  02/22/2024  1:55 PM Starla Harland BROCKS, MD [facility] None  02/28/2024 11:15 AM Starla Harland BROCKS, MD [facility] None    For next visit:  ROB with NST     Lauraine Lakes, CNM  01/28/2024 4:09 PM

## 2024-01-30 LAB — STREP GP B NAA: Strep Gp B NAA: NEGATIVE

## 2024-02-01 LAB — CERVICOVAGINAL ANCILLARY ONLY
Bacterial Vaginitis (gardnerella): POSITIVE — AB
Candida Glabrata: NEGATIVE
Candida Vaginitis: NEGATIVE
Chlamydia: NEGATIVE
Comment: NEGATIVE
Comment: NEGATIVE
Comment: NEGATIVE
Comment: NEGATIVE
Comment: NEGATIVE
Comment: NORMAL
Neisseria Gonorrhea: NEGATIVE
Trichomonas: NEGATIVE

## 2024-02-02 ENCOUNTER — Ambulatory Visit: Payer: Self-pay | Admitting: Registered Nurse

## 2024-02-02 ENCOUNTER — Other Ambulatory Visit: Payer: Self-pay | Admitting: Registered Nurse

## 2024-02-02 MED ORDER — METRONIDAZOLE 500 MG PO TABS: 500.0000 mg | ORAL_TABLET | Freq: Two times a day (BID) | ORAL | 0 refills | Status: AC

## 2024-02-02 NOTE — Addendum Note (Signed)
 Addended by: CHARMA DOMINO on: 02/02/2024 02:45 PM   Modules accepted: Orders

## 2024-02-04 ENCOUNTER — Other Ambulatory Visit

## 2024-02-04 ENCOUNTER — Ambulatory Visit: Admitting: Obstetrics

## 2024-02-04 VITALS — BP 116/80 | HR 98 | Wt 266.0 lb

## 2024-02-04 DIAGNOSIS — O99213 Obesity complicating pregnancy, third trimester: Secondary | ICD-10-CM

## 2024-02-04 DIAGNOSIS — F419 Anxiety disorder, unspecified: Secondary | ICD-10-CM

## 2024-02-04 DIAGNOSIS — O0990 Supervision of high risk pregnancy, unspecified, unspecified trimester: Secondary | ICD-10-CM

## 2024-02-04 DIAGNOSIS — O99210 Obesity complicating pregnancy, unspecified trimester: Secondary | ICD-10-CM

## 2024-02-04 DIAGNOSIS — O99343 Other mental disorders complicating pregnancy, third trimester: Secondary | ICD-10-CM

## 2024-02-04 DIAGNOSIS — Z3A37 37 weeks gestation of pregnancy: Secondary | ICD-10-CM

## 2024-02-04 DIAGNOSIS — E669 Obesity, unspecified: Secondary | ICD-10-CM

## 2024-02-04 DIAGNOSIS — Z3009 Encounter for other general counseling and advice on contraception: Secondary | ICD-10-CM

## 2024-02-04 MED ORDER — HYDROXYZINE HCL 25 MG PO TABS: 25.0000 mg | ORAL_TABLET | Freq: Every evening | ORAL | 0 refills | Status: DC | PRN

## 2024-02-04 NOTE — Assessment & Plan Note (Signed)
RNST today

## 2024-02-04 NOTE — Assessment & Plan Note (Signed)
-  Discussed planned BTL and expectations. Reviewed that is still okay to use hormonal contraceptives for cycle control after BTL if needed

## 2024-02-04 NOTE — Assessment & Plan Note (Signed)
-  Requests medication for sleep, esp for nerves the night before CS. Rx sent for hydroxyzine  -Advised to speak with anesthesiologist immediately prior to CS to request medication for shakes/anxiety, since this was a problem during her first CS

## 2024-02-04 NOTE — Progress Notes (Signed)
" ° ° °  Return Prenatal Note   Assessment/Plan   Plan  28 y.o. G2P1001 at [redacted]w[redacted]d presents for follow-up OB visit. Reviewed prenatal record including previous visit note.  Anxiety -Requests medication for sleep, esp for nerves the night before CS. Rx sent for hydroxyzine  -Advised to speak with anesthesiologist immediately prior to CS to request medication for shakes/anxiety, since this was a problem during her first CS  Obesity in pregnancy -RNST today  Supervision of high risk pregnancy, antepartum -Discussed likely timing of CS. Has preadmission call scheduled to confirm Reviewed labor warning signs. Instructed to call office or come to hospital with persistent headache, vision changes, regular contractions, leaking of fluid, decreased fetal movement or vaginal bleeding.    Unwanted fertility -Discussed planned BTL and expectations. Reviewed that is still okay to use hormonal contraceptives for cycle control after BTL if needed    No orders of the defined types were placed in this encounter.  Return in about 1 week (around 02/11/2024).   Future Appointments  Date Time Provider Department Center  02/08/2024  1:30 PM [facility] None  02/11/2024  1:00 PM [facility] ROOM [facility] None  02/11/2024  1:15 PM Ann Deleon, CNM [facility] None  02/22/2024  1:55 PM Ann Harland BROCKS, MD [facility] None  02/28/2024 11:15 AM Ann Harland BROCKS, MD [facility] None    For next visit:  ROB with NST    Subjective   Ann Deleon is not sleeping well. She is looking forward to meeting her baby but is a little nervous about the birth. She is feeling a lot of pelvic pressure.  Movement: Present Contractions: Irritability  Objective   Flow sheet Vitals: Pulse Rate: 98 BP: 116/80 Total weight gain: 16 lb (7.258 kg)  General Appearance  No acute distress, well appearing, and well nourished Pulmonary   Normal work of breathing Neurologic   Alert and oriented to person, place, and  time Psychiatric   Mood and affect within normal limits    Non-Stress Test Interpretation Indication: BMI  Fetal Heart Rate A Mode: External Baseline Rate (A): 140 bpm Variability: Minimal Accelerations: 15 x 15 Decelerations: None Multiple birth?: No  Uterine Activity Mode: Toco  Interpretation (Fetal Testing) Nonstress Test Interpretation: Reactive    Ann Deleon, CNM 02/04/2024 4:02 PM  "

## 2024-02-04 NOTE — Assessment & Plan Note (Signed)
-  Discussed likely timing of CS. Has preadmission call scheduled to confirm Reviewed labor warning signs. Instructed to call office or come to hospital with persistent headache, vision changes, regular contractions, leaking of fluid, decreased fetal movement or vaginal bleeding.

## 2024-02-08 ENCOUNTER — Other Ambulatory Visit

## 2024-02-08 ENCOUNTER — Encounter
Admission: RE | Admit: 2024-02-08 | Discharge: 2024-02-08 | Disposition: A | Discharge status: Home / Self Care | Source: Ambulatory Visit | Attending: Obstetrics & Gynecology | Admitting: Obstetrics & Gynecology

## 2024-02-08 ENCOUNTER — Other Ambulatory Visit: Payer: Self-pay | Admitting: Obstetrics & Gynecology

## 2024-02-08 ENCOUNTER — Other Ambulatory Visit: Payer: Self-pay

## 2024-02-08 DIAGNOSIS — J4530 Mild persistent asthma, uncomplicated: Secondary | ICD-10-CM

## 2024-02-08 DIAGNOSIS — Z01810 Encounter for preprocedural cardiovascular examination: Secondary | ICD-10-CM

## 2024-02-08 DIAGNOSIS — Z01812 Encounter for preprocedural laboratory examination: Secondary | ICD-10-CM

## 2024-02-08 HISTORY — DX: Pneumonia, unspecified organism: J18.9

## 2024-02-08 HISTORY — DX: Cardiac murmur, unspecified: R01.1

## 2024-02-08 NOTE — Addendum Note (Signed)
 Addended by: STARLA BOLUS C on: 02/08/2024 08:00 AM   Modules accepted: Orders

## 2024-02-08 NOTE — Patient Instructions (Addendum)
 Your procedure is scheduled on: 02/14/24 - Monday  Arrival Time: Please call Labor and Delivery the day before your scheduled C-Section to find out your arrival time. [phone].  Arrival: If your arrival time is prior to 6:00 am, please enter through the Emergency Room Entrance and you will be directed to Labor and Delivery. If your arrival time is 6:00 am or later, please enter the Medical Mall and follow the greeter's instructions.  REMEMBER: Instructions that are not followed completely may result in serious medical risk, up to and including death; or upon the discretion of your surgeon and anesthesiologist your surgery may need to be rescheduled.  Do not eat food or drink any liquids after midnight the night before surgery.  No gum chewing or hard candies.  One week prior to surgery: Stop Anti-inflammatories (NSAIDS) such as Advil , Aleve , Ibuprofen , Motrin , Naproxen , Naprosyn  and Aspirin  based products such as Excedrin, Goody's Powder, BC Powder. You may take Tylenol  if needed for pain up until the day of surgery.   Stop ANY OVER THE COUNTER supplements until after surgery.  Stop Aspirin  beginning 02/08/24.  ON THE DAY OF SURGERY ONLY TAKE THESE MEDICATIONS WITH SIPS OF WATER:  fluticasone  (FLOVENT  HFA)  PRENATAL    Use inhaler albuterol  (VENTOLIN  HFA)  on the day of surgery and bring to the hospital.  No Alcohol for 24 hours before or after surgery.  No Smoking including e-cigarettes for 24 hours prior to surgery.  No chewable tobacco products for at least 6 hours prior to surgery.  No nicotine  patches on the day of surgery.  Do not use any recreational drugs for at least a week prior to your surgery.  Please be advised that the combination of cocaine and anesthesia may have negative outcomes, up to and including death. If you test positive for cocaine, your surgery will be cancelled.  On the morning of surgery brush your teeth with toothpaste and water, you may rinse  your mouth with mouthwash if you wish. Do not swallow any toothpaste or mouthwash.  Use CHG wipes as directed on instruction sheet.  Do not wear jewelry, make-up, hairpins, clips or nail polish.  For welded (permanent) jewelry: bracelets, anklets, waist bands, etc.  Please have this removed prior to surgery.  If it is not removed, there is a chance that hospital personnel will need to cut it off on the day of surgery.  Do not wear lotions, powders, or perfumes.   Do not shave body hair from the neck down 48 hours before surgery.  Contact lenses, hearing aids and dentures may not be worn into surgery.  Do not bring valuables to the hospital. [facility] is not responsible for any missing/lost belongings or valuables.   Notify your doctor if there is any change in your medical condition (cold, fever, infection).  Wear comfortable clothing (specific to your surgery type) to the hospital.  After surgery, you can help prevent lung complications by doing breathing exercises.  Take deep breaths and cough every 1-2 hours. Your doctor may order a device called an Incentive Spirometer to help you take deep breaths.  When coughing or sneezing, hold a pillow firmly against your incision with both hands. This is called splinting. Doing this helps protect your incision. It also decreases belly discomfort.  Please call the Pre-admissions Testing Dept. at [phone] if you have any questions about these instructions.  Surgery Visitation Policy:  Visitor Passes   All visitors, including children, need an identification sticker when  visiting. These stickers must be worn where they can be seen.   Labor & Delivery  Laboring women may have one designated support person and two other visitors of any age visit. The support person must remain the same. The visitors may switch with other visitors. Visitation is permitted 24 hours per day. The designated support person or a visitor over the age  of 16 may sleep overnight in the patient's room. A doula registered with [facility] for labor and delivery support is not considered a visitor. Doulas not registered with [place] are considered visitors.  Mother Baby Unit, OB Specialty and Gynecological Care  A designated support person and three visitors of any age may visit. The three visitors may switch out. The designated support person or a visitor age 53 or older may stay overnight in the room. During the postpartum period (up to 6 weeks), if the mother is the patient, she can have her newborn stay with her if there is another support person present who can be responsible for the baby.                                                                                                              Preparing for Surgery with CHLORHEXIDINE GLUCONATE (CHG) Soap  Chlorhexidine Gluconate (CHG) Soap  o An antiseptic cleaner that kills germs and bonds with the skin to continue killing germs even after washing  o Used for showering the night before surgery and morning of surgery  Before surgery, you can play an important role by reducing the number of germs on your skin.  CHG (Chlorhexidine gluconate) soap is an antiseptic cleanser which kills germs and bonds with the skin to continue killing germs even after washing.  Please do not use if you have an allergy to CHG or antibacterial soaps. If your skin becomes reddened/irritated stop using the CHG.  1. Shower the NIGHT BEFORE SURGERY with CHG soap.  2. If you choose to wash your hair, wash your hair first as usual with your normal shampoo.  3. After shampooing, rinse your hair and body thoroughly to remove the shampoo.  4. Use CHG as you would any other liquid soap. You can apply CHG directly to the skin and wash gently with a clean washcloth.  5. Apply the CHG soap to your body only from the neck down. Do not use on open wounds or open sores. Avoid contact with your eyes, ears,  mouth, and genitals (private parts). Wash face and genitals (private parts) with your normal soap.  6. Wash thoroughly, paying special attention to the area where your surgery will be performed.  7. Thoroughly rinse your body with warm water.  8. Do not shower/wash with your normal soap after using and rinsing off the CHG soap.  9. Do not use lotions, oils, etc., after showering with CHG.  10. Pat yourself dry with a clean towel.  11. Wear clean pajamas to bed the night before surgery.  12. Place clean sheets on your bed the night of  your shower and do not sleep with pets.  13. Do not apply any deodorants/lotions/powders.  14. Please wear clean clothes to the hospital.  15. Remember to brush your teeth with your regular toothpaste.

## 2024-02-09 ENCOUNTER — Encounter
Admission: RE | Admit: 2024-02-09 | Discharge: 2024-02-09 | Disposition: A | Discharge status: Home / Self Care | Source: Ambulatory Visit | Attending: Obstetrics & Gynecology | Admitting: Obstetrics & Gynecology

## 2024-02-09 DIAGNOSIS — J4530 Mild persistent asthma, uncomplicated: Secondary | ICD-10-CM | POA: Insufficient documentation

## 2024-02-09 DIAGNOSIS — Z3A38 38 weeks gestation of pregnancy: Secondary | ICD-10-CM | POA: Insufficient documentation

## 2024-02-09 DIAGNOSIS — Z01812 Encounter for preprocedural laboratory examination: Secondary | ICD-10-CM | POA: Diagnosis present

## 2024-02-09 DIAGNOSIS — Z01818 Encounter for other preprocedural examination: Secondary | ICD-10-CM | POA: Diagnosis not present

## 2024-02-09 DIAGNOSIS — O34219 Maternal care for unspecified type scar from previous cesarean delivery: Secondary | ICD-10-CM | POA: Insufficient documentation

## 2024-02-09 DIAGNOSIS — O99513 Diseases of the respiratory system complicating pregnancy, third trimester: Secondary | ICD-10-CM | POA: Diagnosis not present

## 2024-02-09 DIAGNOSIS — Z01810 Encounter for preprocedural cardiovascular examination: Secondary | ICD-10-CM | POA: Diagnosis present

## 2024-02-09 LAB — BASIC METABOLIC PANEL WITH GFR
Anion gap: 13 (ref 5–15)
BUN: 8 mg/dL (ref 6–20)
CO2: 22 mmol/L (ref 22–32)
Calcium: 9.1 mg/dL (ref 8.9–10.3)
Chloride: 104 mmol/L (ref 98–111)
Creatinine, Ser: 0.69 mg/dL (ref 0.44–1.00)
GFR, Estimated: >60 mL/min
Glucose, Bld: 97 mg/dL (ref 70–99)
Potassium: 3.6 mmol/L (ref 3.5–5.1)
Sodium: 138 mmol/L (ref 135–145)

## 2024-02-09 LAB — TYPE AND SCREEN
ABO/RH(D): O POS
Antibody Screen: NEGATIVE
Extend sample reason: UNDETERMINED

## 2024-02-09 LAB — CBC
HCT: 34.3 % — ABNORMAL LOW (ref 36.0–46.0)
Hemoglobin: 11.1 g/dL — ABNORMAL LOW (ref 12.0–15.0)
MCH: 25.7 pg — ABNORMAL LOW (ref 26.0–34.0)
MCHC: 32.4 g/dL (ref 30.0–36.0)
MCV: 79.4 fL — ABNORMAL LOW (ref 80.0–100.0)
Platelets: 285 K/uL (ref 150–400)
RBC: 4.32 MIL/uL (ref 3.87–5.11)
RDW: 15.1 % (ref 11.5–15.5)
WBC: 10.3 K/uL (ref 4.0–10.5)
nRBC: 0 % (ref 0.0–0.2)

## 2024-02-10 LAB — SYPHILIS: RPR W/REFLEX TO RPR TITER AND TREPONEMAL ANTIBODIES, TRADITIONAL SCREENING AND DIAGNOSIS ALGORITHM: RPR Ser Ql: NONREACTIVE

## 2024-02-11 ENCOUNTER — Ambulatory Visit: Admitting: Certified Nurse Midwife

## 2024-02-11 ENCOUNTER — Other Ambulatory Visit

## 2024-02-11 VITALS — BP 132/85 | HR 102 | Wt 264.0 lb

## 2024-02-11 DIAGNOSIS — O0993 Supervision of high risk pregnancy, unspecified, third trimester: Secondary | ICD-10-CM | POA: Diagnosis not present

## 2024-02-11 DIAGNOSIS — Z3A38 38 weeks gestation of pregnancy: Secondary | ICD-10-CM

## 2024-02-11 DIAGNOSIS — O0990 Supervision of high risk pregnancy, unspecified, unspecified trimester: Secondary | ICD-10-CM

## 2024-02-11 DIAGNOSIS — O34219 Maternal care for unspecified type scar from previous cesarean delivery: Secondary | ICD-10-CM

## 2024-02-11 NOTE — Assessment & Plan Note (Signed)
 Reviewed labor warning signs and expectations for birth. Instructed to call office or come to hospital with persistent headache, vision changes, regular contractions, leaking of fluid, decreased fetal movement or vaginal bleeding. Arrive to ED entrance at [facility] 5am 12/29.

## 2024-02-11 NOTE — Patient Instructions (Signed)
 Third Trimester of Pregnancy  The third trimester of pregnancy is from week 28 through week 40. This is months 7 through 9. The third trimester is a time when your baby is growing fast. Body changes during your third trimester Your body continues to change during this time. The changes usually go away after your baby is born. Physical changes You will continue to gain weight. You may get stretch marks on your hips, belly, and breasts. Your breasts will keep growing and may hurt. A yellow fluid (colostrum) may leak from your breasts. This is the first milk you're making for your baby. Your hair may grow faster and get thicker. In some cases, you may get hair loss. Your belly button may stick out. You may have more swelling in your hands, face, or ankles. Health changes You may have heartburn. You may feel short of breath. This is caused by the uterus that is now bigger. You may have more aches in the pelvis, back, or thighs. You may have more tingling or numbness in your hands, arms, and legs. You may pee more often. You may have trouble pooping (constipation) or swollen veins in the butt that can itch or get painful (hemorrhoids). Other changes You may have more problems sleeping. You may notice the baby moving lower in your belly (dropping). You may have more fluid coming from your vagina. Your joints may feel loose, and you may have pain around your pelvic bone. Follow these instructions at home: Medicines Take medicines only as told by your health care provider. Some medicines are not safe during pregnancy. Your provider may change the medicines that you take. Do not take any medicines unless told to by your provider. Take a prenatal vitamin that has at least 600 micrograms (mcg) of folic acid. Do not use herbal medicines, illegal drugs, or medicines that are not approved by your provider. Eating and drinking While you're pregnant your body needs additional nutrition to help  support your growing baby. Talk with your provider about your nutritional needs. Activity Most women are able to exercise regularly during pregnancy. Exercise routines may need to change at the end of your pregnancy. Talk to your provider about your activities and exercise routine. Relieving pain and discomfort Rest often with your legs raised if you have leg cramps or low back pain. Take warm sitz baths to soothe pain from hemorrhoids. Use hemorrhoid cream if your provider says it's okay. Wear a good, supportive bra if your breasts hurt. Do not use hot tubs, steam rooms, or saunas. Do not douche. Do not use tampons or scented pads. Safety Talk to your provider before traveling far distances. Wear your seatbelt at all times when you're in a car. Talk to your provider if someone hits you, hurts you, or yells at you. Preparing for birth To prepare for your baby: Take childbirth and breastfeeding classes. Visit the hospital and tour the maternity area. Buy a rear-facing car seat. Learn how to install it in your car. General instructions Avoid cat litter boxes and soil used by cats. These things carry germs that can cause harm to your pregnancy and your baby. Do not drink alcohol, smoke, vape, or use products with nicotine or tobacco in them. If you need help quitting, talk with your provider. Keep all follow-up visits for your third trimester. Your provider will do more exams and tests during this trimester. Write down your questions. Take them to your prenatal visits. Your provider also will: Talk with you about  your overall health. Give you advice or refer you to specialists who can help with different needs, including: Mental health and counseling. Foods and healthy eating. Ask for help if you need help with food. Where to find more information American Pregnancy Association: americanpregnancy.org [employer]ians [employer] Gynecologists: acog.org Office on [employer] Health:  [employer] Contact a health care provider if: You have a headache that does not go away when you take medicine. You have any of these problems: You can't eat or drink. You have nausea and vomiting. You have watery poop (diarrhea) for 2 days or more. You have pain when you pee, or your pee smells bad. You have been sick for 2 days or more and aren't getting better. Contact your provider right away if: You have any of these coming from your vagina: Abnormal discharge. Bad-smelling fluid. Bleeding. Your baby is moving less than usual. You have signs of labor: You have any contractions, belly cramping, or have pain in your pelvis or lower back before 37 weeks of pregnancy (preterm labor). You have regular contractions that are less than 5 minutes apart. Your water breaks. You have symptoms of high blood pressure or preeclampsia. These include: A severe, throbbing headache that does not go away. Sudden or extreme swelling of your face, hands, legs, or feet. Vision problems: You see spots. You have blurry vision. Your eyes are sensitive to light. If you can't reach your provider, go to an urgent care or emergency room. Get help right away if: You faint, become confused, or can't think clearly. You have chest pain or trouble breathing. You have any kind of injury, such as from a fall or a car crash. These symptoms may be an emergency. Call 911 right away. Do not wait to see if the symptoms will go away. Do not drive yourself to the hospital. This information is not intended to replace advice given to you by your health care provider. Make sure you discuss any questions you have with your health care provider. Document Revised: 11/05/2022 Document Reviewed: 06/05/2022 Elsevier Patient Education  2024 [employer].

## 2024-02-11 NOTE — Progress Notes (Signed)
" ° ° °  Return Prenatal Note   Subjective   28 y.o. G2P1001 at [redacted]w[redacted]d presents for this follow-up prenatal visit.  Patient feeling well, ready for delivery. Has completed preadmission testing. Patient reports: Movement: Present Contractions: Not present  Objective   Flow sheet Vitals: Pulse Rate: (!) 102 BP: 132/85 Total weight gain: 14 lb (6.35 kg)  General Appearance  No acute distress, well appearing, and well nourished Pulmonary   Normal work of breathing Neurologic   Alert and oriented to person, place, and time Psychiatric   Mood and affect within normal limits   Fetal Heart Rate A Mode: External Baseline Rate (A): 150 bpm Variability: Moderate Accelerations: 15 x 15 Decelerations: None Multiple birth?: No  Uterine Activity Mode: Toco Contraction Frequency (min): irregular, occasional Contraction Quality: Mild Resting Time: Adequate  Interpretation (Fetal Testing) Nonstress Test Interpretation: Reactive Overall Impression: Reassuring for gestational age    Assessment/Plan   Plan  28 y.o. G2P1001 at [redacted]w[redacted]d presents for follow-up OB visit. Reviewed prenatal record including previous visit note.  Supervision of high risk pregnancy, antepartum Reviewed labor warning signs and expectations for birth. Instructed to call office or come to hospital with persistent headache, vision changes, regular contractions, leaking of fluid, decreased fetal movement or vaginal bleeding. Arrive to ED entrance at [facility] 5am 12/29.      No orders of the defined types were placed in this encounter.  No follow-ups on file.   Future Appointments  Date Time Provider Department Center  02/23/2024  1:15 PM Sebastian Sham, CNM [facility] None  02/28/2024 11:15 AM Starla Harland BROCKS, MD [facility] None    For next visit:  12/29 arrive to [facility] ED entrance at 5am for scheduled Cesarean birth.     Harlene LITTIE Cisco, CNM  12/26/20251:51 PM  "

## 2024-02-14 ENCOUNTER — Inpatient Hospital Stay
Admission: RE | Admit: 2024-02-14 | Discharge: 2024-02-16 | DRG: 784 | Disposition: A | Discharge status: Home / Self Care | Attending: Obstetrics | Admitting: Obstetrics

## 2024-02-14 ENCOUNTER — Encounter: Payer: Self-pay | Admitting: Obstetrics & Gynecology

## 2024-02-14 ENCOUNTER — Other Ambulatory Visit (HOSPITAL_COMMUNITY): Payer: Self-pay

## 2024-02-14 ENCOUNTER — Encounter
Admission: RE | Disposition: A | Discharge status: Home / Self Care | Payer: Self-pay | Source: Home / Self Care | Attending: Obstetrics & Gynecology

## 2024-02-14 ENCOUNTER — Other Ambulatory Visit: Payer: Self-pay

## 2024-02-14 ENCOUNTER — Inpatient Hospital Stay: Admitting: Certified Registered"

## 2024-02-14 ENCOUNTER — Inpatient Hospital Stay: Payer: Self-pay | Admitting: Urgent Care

## 2024-02-14 DIAGNOSIS — K219 Gastro-esophageal reflux disease without esophagitis: Secondary | ICD-10-CM | POA: Diagnosis present

## 2024-02-14 DIAGNOSIS — Z3A39 39 weeks gestation of pregnancy: Secondary | ICD-10-CM

## 2024-02-14 DIAGNOSIS — O34211 Maternal care for low transverse scar from previous cesarean delivery: Secondary | ICD-10-CM | POA: Diagnosis present

## 2024-02-14 DIAGNOSIS — O0990 Supervision of high risk pregnancy, unspecified, unspecified trimester: Principal | ICD-10-CM

## 2024-02-14 DIAGNOSIS — Z3490 Encounter for supervision of normal pregnancy, unspecified, unspecified trimester: Secondary | ICD-10-CM

## 2024-02-14 DIAGNOSIS — D62 Acute posthemorrhagic anemia: Secondary | ICD-10-CM | POA: Diagnosis not present

## 2024-02-14 DIAGNOSIS — Z833 Family history of diabetes mellitus: Secondary | ICD-10-CM

## 2024-02-14 DIAGNOSIS — Z8249 Family history of ischemic heart disease and other diseases of the circulatory system: Secondary | ICD-10-CM

## 2024-02-14 DIAGNOSIS — O34219 Maternal care for unspecified type scar from previous cesarean delivery: Secondary | ICD-10-CM | POA: Diagnosis present

## 2024-02-14 DIAGNOSIS — O9081 Anemia of the puerperium: Secondary | ICD-10-CM | POA: Diagnosis not present

## 2024-02-14 DIAGNOSIS — Z302 Encounter for sterilization: Secondary | ICD-10-CM

## 2024-02-14 DIAGNOSIS — O9962 Diseases of the digestive system complicating childbirth: Secondary | ICD-10-CM | POA: Diagnosis present

## 2024-02-14 LAB — CBC
HCT: 33.2 % — ABNORMAL LOW (ref 36.0–46.0)
Hemoglobin: 10.8 g/dL — ABNORMAL LOW (ref 12.0–15.0)
MCH: 25.4 pg — ABNORMAL LOW (ref 26.0–34.0)
MCHC: 32.5 g/dL (ref 30.0–36.0)
MCV: 78.1 fL — ABNORMAL LOW (ref 80.0–100.0)
Platelets: 289 K/uL (ref 150–400)
RBC: 4.25 MIL/uL (ref 3.87–5.11)
RDW: 15.1 % (ref 11.5–15.5)
WBC: 12.6 K/uL — ABNORMAL HIGH (ref 4.0–10.5)
nRBC: 0 % (ref 0.0–0.2)

## 2024-02-14 LAB — URINE DRUG SCREEN
Amphetamines: NEGATIVE
Barbiturates: NEGATIVE
Benzodiazepines: NEGATIVE
Cocaine: NEGATIVE
Fentanyl: NEGATIVE
Methadone Scn, Ur: NEGATIVE
Opiates: NEGATIVE
Tetrahydrocannabinol: NEGATIVE

## 2024-02-14 LAB — TYPE AND SCREEN
ABO/RH(D): O POS
Antibody Screen: NEGATIVE

## 2024-02-14 SURGERY — Surgical Case
Anesthesia: Spinal

## 2024-02-14 MED ORDER — CEFAZOLIN SODIUM-DEXTROSE 3-4 GM/150ML-% IV SOLN
3.0000 g | Freq: Once | INTRAVENOUS | Status: AC
  Administered 2024-02-14: 3 g via INTRAVENOUS
  Filled 2024-02-14: qty 150

## 2024-02-14 MED ORDER — DIBUCAINE (PERIANAL) 1 % EX OINT: 1.0000 | TOPICAL_OINTMENT | CUTANEOUS | Status: DC | PRN

## 2024-02-14 MED ORDER — ZOLPIDEM TARTRATE 5 MG PO TABS: 5.0000 mg | ORAL_TABLET | Freq: Every evening | ORAL | Status: DC | PRN

## 2024-02-14 MED ORDER — SOD CITRATE-CITRIC ACID 500-334 MG/5ML PO SOLN
ORAL | Status: AC
  Administered 2024-02-14: 30 mL via ORAL
  Filled 2024-02-14: qty 15

## 2024-02-14 MED ORDER — OXYCODONE HCL 5 MG PO TABS
5.0000 mg | ORAL_TABLET | ORAL | Status: DC | PRN
  Administered 2024-02-14 – 2024-02-16 (×5): 5 mg via ORAL
  Filled 2024-02-14: qty 1
  Filled 2024-02-14: qty 2
  Filled 2024-02-14 (×2): qty 1

## 2024-02-14 MED ORDER — SIMETHICONE 80 MG PO CHEW
80.0000 mg | CHEWABLE_TABLET | Freq: Three times a day (TID) | ORAL | Status: DC
  Administered 2024-02-14 – 2024-02-16 (×6): 80 mg via ORAL
  Filled 2024-02-14 (×6): qty 1

## 2024-02-14 MED ORDER — COCONUT OIL OIL: 1.0000 | TOPICAL_OIL | Status: DC | PRN

## 2024-02-14 MED ORDER — KETOROLAC TROMETHAMINE 30 MG/ML IJ SOLN
INTRAMUSCULAR | Status: AC
  Filled 2024-02-14: qty 1

## 2024-02-14 MED ORDER — BUPIVACAINE HCL (PF) 0.5 % IJ SOLN
INTRAMUSCULAR | Status: AC
  Filled 2024-02-14: qty 30

## 2024-02-14 MED ORDER — OXYCODONE HCL 5 MG PO TABS
5.0000 mg | ORAL_TABLET | Freq: Four times a day (QID) | ORAL | Status: DC | PRN
  Administered 2024-02-14: 5 mg via ORAL
  Filled 2024-02-14 (×3): qty 1

## 2024-02-14 MED ORDER — SODIUM CHLORIDE 0.9 % IR SOLN
Status: DC | PRN
  Administered 2024-02-14: 1000 mL

## 2024-02-14 MED ORDER — ONDANSETRON HCL 4 MG/2ML IJ SOLN
INTRAMUSCULAR | Status: DC | PRN
  Administered 2024-02-14: 4 mg via INTRAVENOUS

## 2024-02-14 MED ORDER — SODIUM CHLORIDE 0.9% FLUSH: 3.0000 mL | INTRAVENOUS | Status: DC | PRN

## 2024-02-14 MED ORDER — SOD CITRATE-CITRIC ACID 500-334 MG/5ML PO SOLN: 30.0000 mL | Freq: Once | ORAL | Status: AC

## 2024-02-14 MED ORDER — FLUTICASONE FUROATE-VILANTEROL 100-25 MCG/ACT IN AEPB: 1.0000 | INHALATION_SPRAY | Freq: Two times a day (BID) | RESPIRATORY_TRACT | Status: DC | PRN

## 2024-02-14 MED ORDER — WITCH HAZEL-GLYCERIN EX PADS: 1.0000 | MEDICATED_PAD | CUTANEOUS | Status: DC | PRN

## 2024-02-14 MED ORDER — LACTATED RINGERS IV SOLN: Freq: Once | INTRAVENOUS | Status: AC

## 2024-02-14 MED ORDER — ALBUTEROL SULFATE (2.5 MG/3ML) 0.083% IN NEBU: 2.5000 mg | INHALATION_SOLUTION | Freq: Four times a day (QID) | RESPIRATORY_TRACT | Status: DC | PRN

## 2024-02-14 MED ORDER — PRENATAL MULTIVITAMIN CH: 1.0000 | ORAL_TABLET | Freq: Every day | ORAL | Status: DC

## 2024-02-14 MED ORDER — PHENYLEPHRINE HCL-NACL 20-0.9 MG/250ML-% IV SOLN
INTRAVENOUS | Status: DC | PRN
  Administered 2024-02-14: 50 ug/min via INTRAVENOUS

## 2024-02-14 MED ORDER — MENTHOL 3 MG MT LOZG: 1.0000 | LOZENGE | OROMUCOSAL | Status: DC | PRN

## 2024-02-14 MED ORDER — DIPHENHYDRAMINE HCL 25 MG PO CAPS: 25.0000 mg | ORAL_CAPSULE | Freq: Four times a day (QID) | ORAL | Status: DC | PRN

## 2024-02-14 MED ORDER — FENTANYL CITRATE (PF) 100 MCG/2ML IJ SOLN
INTRAMUSCULAR | Status: DC | PRN
  Administered 2024-02-14: 15 ug via INTRATHECAL

## 2024-02-14 MED ORDER — DIPHENHYDRAMINE HCL 25 MG PO CAPS: 25.0000 mg | ORAL_CAPSULE | ORAL | Status: DC | PRN

## 2024-02-14 MED ORDER — PRENATAL PLUS 27-1 MG PO TABS
1.0000 | ORAL_TABLET | Freq: Every day | ORAL | Status: DC
  Administered 2024-02-15 – 2024-02-16 (×2): 1 via ORAL
  Filled 2024-02-14 (×2): qty 1

## 2024-02-14 MED ORDER — CALCIUM CARBONATE ANTACID 500 MG PO CHEW
2.0000 | CHEWABLE_TABLET | Freq: Once | ORAL | Status: AC
  Administered 2024-02-14: 400 mg via ORAL
  Filled 2024-02-14: qty 2

## 2024-02-14 MED ORDER — ONDANSETRON HCL 4 MG/2ML IJ SOLN: 4.0000 mg | Freq: Three times a day (TID) | INTRAMUSCULAR | Status: DC | PRN

## 2024-02-14 MED ORDER — CHLORHEXIDINE GLUCONATE 0.12 % MT SOLN
15.0000 mL | Freq: Once | OROMUCOSAL | Status: AC
  Administered 2024-02-14: 15 mL via OROMUCOSAL
  Filled 2024-02-14: qty 15

## 2024-02-14 MED ORDER — OXYTOCIN-SODIUM CHLORIDE 30-0.9 UT/500ML-% IV SOLN
INTRAVENOUS | Status: AC
  Filled 2024-02-14: qty 500

## 2024-02-14 MED ORDER — ACETAMINOPHEN 500 MG PO TABS
1000.0000 mg | ORAL_TABLET | Freq: Four times a day (QID) | ORAL | Status: DC
  Administered 2024-02-14 – 2024-02-16 (×8): 1000 mg via ORAL
  Filled 2024-02-14 (×8): qty 2

## 2024-02-14 MED ORDER — IBUPROFEN 600 MG PO TABS
600.0000 mg | ORAL_TABLET | Freq: Four times a day (QID) | ORAL | Status: DC
  Administered 2024-02-15 – 2024-02-16 (×6): 600 mg via ORAL
  Filled 2024-02-14 (×6): qty 1

## 2024-02-14 MED ORDER — KETOROLAC TROMETHAMINE 30 MG/ML IJ SOLN
INTRAMUSCULAR | Status: DC | PRN
  Administered 2024-02-14: 30 mg via INTRAVENOUS

## 2024-02-14 MED ORDER — DEXTROSE IN LACTATED RINGERS 5 % IV SOLN: INTRAVENOUS | Status: DC

## 2024-02-14 MED ORDER — FENTANYL CITRATE (PF) 100 MCG/2ML IJ SOLN
INTRAMUSCULAR | Status: AC
  Filled 2024-02-14: qty 2

## 2024-02-14 MED ORDER — POVIDONE-IODINE 10 % EX SWAB: 2.0000 | Freq: Once | CUTANEOUS | Status: DC

## 2024-02-14 MED ORDER — KETOROLAC TROMETHAMINE 30 MG/ML IJ SOLN
30.0000 mg | Freq: Four times a day (QID) | INTRAMUSCULAR | Status: AC | PRN
  Administered 2024-02-14 (×2): 30 mg via INTRAVENOUS
  Filled 2024-02-14 (×2): qty 1

## 2024-02-14 MED ORDER — NALOXONE HCL 0.4 MG/ML IJ SOLN: 0.4000 mg | INTRAMUSCULAR | Status: DC | PRN

## 2024-02-14 MED ORDER — PHENYLEPHRINE HCL-NACL 20-0.9 MG/250ML-% IV SOLN
INTRAVENOUS | Status: AC
  Filled 2024-02-14: qty 250

## 2024-02-14 MED ORDER — ACETAMINOPHEN 500 MG PO TABS
1000.0000 mg | ORAL_TABLET | Freq: Four times a day (QID) | ORAL | Status: DC
  Administered 2024-02-14: 1000 mg via ORAL
  Filled 2024-02-14: qty 2

## 2024-02-14 MED ORDER — MORPHINE SULFATE (PF) 0.5 MG/ML IJ SOLN
INTRAMUSCULAR | Status: AC
  Filled 2024-02-14: qty 10

## 2024-02-14 MED ORDER — OXYTOCIN-SODIUM CHLORIDE 30-0.9 UT/500ML-% IV SOLN
2.5000 [IU]/h | INTRAVENOUS | Status: AC
  Administered 2024-02-14: 2.5 [IU]/h via INTRAVENOUS

## 2024-02-14 MED ORDER — BUPIVACAINE HCL (PF) 0.25 % IJ SOLN
INTRAMUSCULAR | Status: DC | PRN
  Administered 2024-02-14: 30 mL

## 2024-02-14 MED ORDER — SCOPOLAMINE 1 MG/3DAYS TD PT72: 1.0000 | MEDICATED_PATCH | Freq: Once | TRANSDERMAL | Status: DC

## 2024-02-14 MED ORDER — LACTATED RINGERS IV SOLN: INTRAVENOUS | Status: DC | PRN

## 2024-02-14 MED ORDER — KETOROLAC TROMETHAMINE 30 MG/ML IJ SOLN: 30.0000 mg | Freq: Four times a day (QID) | INTRAMUSCULAR | Status: AC | PRN

## 2024-02-14 MED ORDER — FAMOTIDINE 20 MG PO TABS
20.0000 mg | ORAL_TABLET | Freq: Two times a day (BID) | ORAL | Status: DC
  Administered 2024-02-14 – 2024-02-16 (×3): 20 mg via ORAL
  Filled 2024-02-14 (×4): qty 1

## 2024-02-14 MED ORDER — SIMETHICONE 80 MG PO CHEW: 80.0000 mg | CHEWABLE_TABLET | ORAL | Status: DC | PRN

## 2024-02-14 MED ORDER — MORPHINE SULFATE (PF) 0.5 MG/ML IJ SOLN
INTRAMUSCULAR | Status: DC | PRN
  Administered 2024-02-14: .1 mg via INTRATHECAL

## 2024-02-14 MED ORDER — BUPIVACAINE IN DEXTROSE 0.75-8.25 % IT SOLN
INTRATHECAL | Status: DC | PRN
  Administered 2024-02-14: 1.6 mL via INTRATHECAL

## 2024-02-14 MED ORDER — DIPHENHYDRAMINE HCL 50 MG/ML IJ SOLN: 12.5000 mg | INTRAMUSCULAR | Status: DC | PRN

## 2024-02-14 MED ORDER — OXYTOCIN-SODIUM CHLORIDE 30-0.9 UT/500ML-% IV SOLN
INTRAVENOUS | Status: DC | PRN
  Administered 2024-02-14: 999 mL/h via INTRAVENOUS

## 2024-02-14 MED ORDER — LIDOCAINE 5 % EX PTCH
1.0000 | MEDICATED_PATCH | CUTANEOUS | Status: DC
  Administered 2024-02-14 – 2024-02-16 (×2): 1 via TRANSDERMAL
  Filled 2024-02-14 (×3): qty 1

## 2024-02-14 MED ORDER — ORAL CARE MOUTH RINSE: 15.0000 mL | Freq: Once | OROMUCOSAL | Status: AC

## 2024-02-14 MED ORDER — NALOXONE HCL 4 MG/10ML IJ SOLN: 1.0000 ug/kg/h | INTRAVENOUS | Status: DC | PRN

## 2024-02-14 MED ORDER — SENNOSIDES-DOCUSATE SODIUM 8.6-50 MG PO TABS
2.0000 | ORAL_TABLET | Freq: Every day | ORAL | Status: DC
  Administered 2024-02-15 – 2024-02-16 (×2): 2 via ORAL
  Filled 2024-02-14 (×2): qty 2

## 2024-02-14 SURGICAL SUPPLY — 24 items
BARRIER ADHS 3X4 INTERCEED (GAUZE/BANDAGES/DRESSINGS) IMPLANT
BENZOIN TINCTURE PRP APPL 2/3 (GAUZE/BANDAGES/DRESSINGS) IMPLANT
CLIP FILSHIE TUBAL LIGA STRL (Clip) IMPLANT
CLOSURE STERI STRIP 1/2 X4 (GAUZE/BANDAGES/DRESSINGS) IMPLANT
DRSG OPSITE POSTOP 4X10 (GAUZE/BANDAGES/DRESSINGS) ×1 IMPLANT
DRSG TEGADERM 4X4.75 (GAUZE/BANDAGES/DRESSINGS) IMPLANT
ELECTRODE REM PT RTRN 9FT ADLT (ELECTROSURGICAL) ×1 IMPLANT
EXTRACTOR VACUUM KIWI (MISCELLANEOUS) IMPLANT
GLOVE BIO SURGEON STRL SZ 6.5 (GLOVE) ×1 IMPLANT
GLOVE BIOGEL PI IND STRL 7.0 (GLOVE) ×1 IMPLANT
GOWN STRL REUS W/ TWL LRG LVL3 (GOWN DISPOSABLE) ×2 IMPLANT
NEEDLE HYPO 18GX1.5 BLUNT FILL (NEEDLE) IMPLANT
NEEDLE SPNL 18GX3.5 QUINCKE PK (NEEDLE) ×1 IMPLANT
PACK C SECTION AR (MISCELLANEOUS) ×1 IMPLANT
SOLN 0.9% NACL POUR BTL 1000ML (IV SOLUTION) ×1 IMPLANT
STRIP CLOSURE SKIN 1/2X4 (GAUZE/BANDAGES/DRESSINGS) IMPLANT
SUT VIC AB 0 CT1 27XBRD ANBCTR (SUTURE) IMPLANT
SUT VIC AB 0 CT1 36 (SUTURE) IMPLANT
SUT VIC AB 0 CTX36XBRD ANBCTRL (SUTURE) IMPLANT
SUT VIC AB 2-0 CT1 TAPERPNT 27 (SUTURE) ×1 IMPLANT
SUT VIC AB 3-0 SH 27X BRD (SUTURE) IMPLANT
SUT VICRYL 3-0 (SUTURE) IMPLANT
SYR 30ML LL (SYRINGE) ×1 IMPLANT
TOWEL OR 17X26 4PK STRL BLUE (TOWEL DISPOSABLE) ×1 IMPLANT

## 2024-02-14 NOTE — Discharge Instructions (Signed)

## 2024-02-14 NOTE — Transfer of Care (Signed)
 Immediate Anesthesia Transfer of Care Note  Patient: Ann Deleon  Procedure(s) Performed: CESAREAN DELIVERY  Patient Location: Mother/Baby  Anesthesia Type:Spinal  Level of Consciousness: awake, alert , and oriented  Airway & Oxygen Therapy: Patient Spontanous Breathing  Post-op Assessment: Report given to RN and Post -op Vital signs reviewed and stable  Post vital signs: stable, ketoralac given for pain control  Last Vitals:  Vitals Value Taken Time  BP    Temp    Pulse    Resp    SpO2      Last Pain:  Vitals:   02/14/24 0530  TempSrc: Oral         Complications: No notable events documented.

## 2024-02-14 NOTE — Op Note (Signed)
 02/14/2024  8:56 AM  PATIENT:  Ann Deleon  28 y.o. female  PRE-OPERATIVE DIAGNOSIS:  previous cesarean, unwanted fertility  POST-OPERATIVE DIAGNOSIS:  previous cesarean, unwanted fertility  FINDINGS: living female infant, Apgars 8 & 9, Weight 6 pounds 12 ounces Normal intact placenta, normal adnexa, dense adhsions  PROCEDURE:  Procedures with comments: CESAREAN DELIVERY (N/A) - Repeat low transverse cesarean section and BTL Lysis of adhesions  SURGEON:  Surgeons and Role:    * Starla, Harland BROCKS, MD - Primary   ASSISTANTS: Jinnie Cookey, CNM   ANESTHESIA:   local and spinal  EBL:  585 mL   BLOOD ADMINISTERED:none  DRAINS: Foley catheter  LOCAL MEDICATIONS USED:  MARCAINE      SPECIMEN:  Source of Specimen:  cord blood  DISPOSITION OF SPECIMEN:  PATHOLOGY  COUNTS:  YES  TOURNIQUET:  * No tourniquets in log *  DICTATION: .Dragon Dictation  PLAN OF CARE: Admit to inpatient   PATIENT DISPOSITION:  PACU - hemodynamically stable.   Delay start of Pharmacological VTE agent (>24hrs) due to surgical blood loss or risk of bleeding: not applicable  The risks, benefits, and alternatives of surgery were explained, understood, accepted. Consents were signed. All questions were answered. In the operating room spinal anesthesia was applied without complication. Her abdomen and vagina were prepped and draped in the usual sterile fashion. A Foley catheter was placed, draining clear urine throughout case. Timeout procedure was done. After adequate anesthesia was assured, an incision was made through the previous incision. The incision was carried down through the subcutaneous tissue to the fascia. The fascia was scored the midline and extended bilaterally. There were very dense adhsions of the fascia. I elevated the fascia with Kocher clamps and separated them from the underlying fascia as much as the adhesions allowed.  I was finally able to find a window into the peritoneum. There were  dense adhesions of the omentum to the bladder and the anterior abdominal wall I carefully removed the adhsions with the Bovie and Metzenbaum scissors. I was finally able to see the uterus and noted that the bladder was adherent to the uterus very high up. I created a bladder flap.  Peritoneal incision was extended bilaterally with the Bovie as much as the adhesions allowed. The opening allowed by the adhesions for the delivery of the baby was deemed adequate but I called to have a Kiwi vacuum available. The bladder blade was placed. A transverse incision was made on the well-developed lower uterine segment. The uterine incision was extended with traction on each side. Amniotomy was performed with a hemostat. Clear fluid was noted. The baby was delivered from a vertex presentation with the aid of the Kiwi (flat). The pressure was in the green zone and only 1 pull was required. The mouth and nostrils were suctioned prior to delivery of the shoulders.  The baby's cord was clamped and cut and was transferred to the NICU personnel for routine care after one minute of delayed cord clamping. Cord blood sample was obtained. The placenta was delivered intact with traction. The uterus was left in situ and the interior was cleaned with a dry lap sponge. The uterine incision was closed with 0 Vicryl running locking suture. Excellent hemostasis was noted. By tilting the uterus each side was able to visualize the adnexa, and they were normal. I noted very large blood vessels the mesosalpinx on both side. I placed a Filsche clip across the entire tube in the isthmic region of each tube.  Hemostasis was maintained. The rectus fascia rectus muscles were noted be hemostatic as well. The fascia was closed with a #1 PDS loop in a running nonlocking fashion. No defects were palpable. The subcutaneous tissue was irrigated, clean, and dried. A subcuticular closure was done with a 3-0 Vicryl suture. Steri-Strips are placed. Excellent  cosmetic results were obtained. She was taken to the recovery room in stable condition. She tolerated the procedure well.

## 2024-02-14 NOTE — Anesthesia Procedure Notes (Addendum)
 Spinal  Patient location during procedure: OR Start time: 02/14/2024 7:59 AM End time: 02/14/2024 8:10 AM Reason for block: surgical anesthesia  Staffing Performed: resident/CRNA  Authorized by: Vicci Camellia Glatter, MD   Performed by: Norleen Alberta HERO., CRNA  Preanesthetic Checklist Completed: patient identified, IV checked, site marked, risks and benefits discussed, surgical consent, monitors and equipment checked, pre-op evaluation and timeout performed Spinal Block Patient position: sitting Prep: Betadine  Patient monitoring: heart rate, continuous pulse ox, blood pressure and cardiac monitor Approach: midline Location: L4-5 Injection technique: single-shot Needle Needle type: Whitacre and Introducer  Needle gauge: 24 G Needle length: 9 cm Assessment Events: CSF return  Additional Notes Negative paresthesia. Negative blood return. Positive free-flowing CSF. Expiration date of kit checked and confirmed. Patient tolerated procedure well, without complications.

## 2024-02-14 NOTE — Anesthesia Preprocedure Evaluation (Addendum)
"                                    Anesthesia Evaluation  Patient identified by MRN, date of birth, ID band Patient awake    Reviewed: Allergy & Precautions, NPO status , Patient's Chart, lab work & pertinent test results  History of Anesthesia Complications Negative for: history of anesthetic complications  Airway Mallampati: III  TM Distance: >3 FB Neck ROM: Full    Dental no notable dental hx.    Pulmonary asthma    Pulmonary exam normal breath sounds clear to auscultation       Cardiovascular Exercise Tolerance: Good negative cardio ROS Normal cardiovascular exam Rhythm:Regular Rate:Normal     Neuro/Psych   Anxiety     negative neurological ROS  negative psych ROS   GI/Hepatic Neg liver ROS,GERD  ,,  Endo/Other  negative endocrine ROS    Renal/GU negative Renal ROS  negative genitourinary   Musculoskeletal negative musculoskeletal ROS (+)    Abdominal  (+) + obese  Peds negative pediatric ROS (+)  Hematology  (+) Blood dyscrasia, anemia   Anesthesia Other Findings   Reproductive/Obstetrics negative OB ROS                              Anesthesia Physical Anesthesia Plan  ASA: 2  Anesthesia Plan: Spinal   Post-op Pain Management: Gabapentin  PO (pre-op)*, Tylenol  PO (pre-op)* and Toradol  IV (intra-op)*   Induction: Intravenous  PONV Risk Score and Plan: 3 and Ondansetron  and Treatment may vary due to age or medical condition  Airway Management Planned: Natural Airway and Nasal Cannula  Additional Equipment:   Intra-op Plan:   Post-operative Plan:   Informed Consent: I have reviewed the patients History and Physical, chart, labs and discussed the procedure including the risks, benefits and alternatives for the proposed anesthesia with the patient or authorized representative who has indicated his/her understanding and acceptance.     Dental Advisory Given  Plan Discussed with: Anesthesiologist, CRNA  and Surgeon  Anesthesia Plan Comments:          Anesthesia Quick Evaluation  "

## 2024-02-14 NOTE — Lactation Note (Signed)
 This note was copied from a baby's chart. Lactation Consultation Note  Patient Name: Ann Deleon Today's Date: 02/14/2024 Age:26 hours Reason for consult: Initial assessment;Term;Breastfeeding assistance   Maternal Data Has patient been taught Hand Expression?: Yes Does the patient have breastfeeding experience prior to this delivery?: No  Initial assessment w/ a 7hr old baby Ann and parents.  This is moms 2nd baby.  She states she really wants to breastfeed because she didn't have success with her first child.  This was a elective repeat c-section.  Patient w/ a hx of anxiety.    Mom expressed that she has several different breastpumps at home.  Feeding Mother's Current Feeding Choice: Breast Milk  LC assisted patient w/ arousing infant at the breast and putting infant in football hold.  Infant very gaggy and spitting up. Reluctant to eat.  After several attempts, LC placed infant STS and recommended trying again in 30 minutes.  LATCH Score Latch: Too sleepy or reluctant, no latch achieved, no sucking elicited.  Audible Swallowing: None  Type of Nipple: Everted at rest and after stimulation (Mom is short erect.)  Comfort (Breast/Nipple): Soft / non-tender  Hold (Positioning): Assistance needed to correctly position infant at breast and maintain latch.  LATCH Score: 5  Interventions Interventions: Breast feeding basics reviewed;Assisted with latch;Skin to skin;Breast compression;Adjust position;Support pillows;Education  LC provided education on the following;  milk production expectations, hunger cues, day 1/2 wet/dirty diapers, hand expression, cluster feeding, benefits of STS and arousing infant for a feeding.  Lactation informed patient of feeding infant at least 8 or more times w/in a 24hr period but not exceeding 3hrs. Patient verbalized understanding.   Discharge Discharge Education: Outpatient recommendation Pump: Personal;Hands Ann Deleon;Manual;DEBP (Spectra ;  Manual Pump; Mom Cozy; Baby Buddha)  Consult Status Consult Status: Follow-up Follow-up type: In-patient    Ann Deleon 02/14/2024, 3:30 PM

## 2024-02-14 NOTE — Discharge Summary (Signed)
 "    Postpartum Discharge Summary  Date of Service updated 02/16/2024      Patient Name: Ann Deleon DOB: [birth year] MRN: [id number]  Date of admission: 02/14/2024 Delivery date:02/14/2024 Delivering provider: STARLA HARLAND BROCKS Date of discharge: 02/16/2024  Admitting diagnosis: Previous cesarean delivery affecting pregnancy [O34.219] Unwanted fertility [Z30.09] Pregnancy [Z34.90] Intrauterine pregnancy: [redacted]w[redacted]d     Secondary diagnosis:  Principal Problem:   Pregnancy  Additional problems: BMI 40     Discharge diagnosis: Term Pregnancy Delivered                                              Post partum procedures:none Augmentation: N/A Complications: None  Hospital course: Sceduled C/S   28 y.o. yo G2P2002 at [redacted]w[redacted]d was admitted to the hospital 02/14/2024 for scheduled cesarean section with the following indication:Elective Repeat.Delivery details are as follows:  Membrane Rupture Time/Date: 8:25 AM,02/14/2024  Delivery Method:C-Section, Low Transverse Operative Delivery:kiwi, flat  Details of operation can be found in separate operative note.  Patient had an uncomplicated postpartum course complicated.  She is ambulating, tolerating a regular diet, passing flatus, and urinating well. Patient is discharged home in stable condition on  02/16/2024        Newborn Data: Birth date:02/14/2024 Birth time:8:27 AM Gender:Female Living status:Living Apgars:8 ,9  Weight:3070 g    Magnesium  Sulfate received: No BMZ received: No Rhophylac:N/A MMR:N/A T-DaP:Given prenatally Flu: Yes RSV Vaccine received: Yes Transfusion:No Immunizations administered: Immunization History  Administered Date(s) Administered    sv, Bivalent, Protein Subunit Rsvpref,pf Marlow) 01/28/2024   Influenza, Seasonal, Injecte, Preservative Fre 11/24/2023   Influenza,inj,Quad PF,6+ Mos 12/28/2014, 10/29/2015, 12/16/2016, 12/17/2021   PFIZER(Purple Top)SARS-COV-2 Vaccination 07/07/2019   Tdap 06/05/2013,  08/27/2021, 11/24/2023    Physical exam  Vitals:   02/15/24 0815 02/15/24 1621 02/15/24 2302 02/16/24 0846  BP: 115/68 119/79 129/87 126/77  Pulse:   71 70  Resp: 19 18 18 18   Temp: 98.8 F (37.1 C) 97.7 F (36.5 C) 98.1 F (36.7 C) 98.3 F (36.8 C)  TempSrc: Oral Oral Oral Oral  SpO2:   100% 100%  Weight:      Height:       General: alert, cooperative, and no distress Lochia: appropriate Uterine Fundus: firm Incision: Healing well with no significant drainage, No significant erythema, Dressing is clean, dry, and intact DVT Evaluation: No evidence of DVT seen on physical exam. Labs: Lab Results  Component Value Date   WBC 9.2 02/15/2024   HGB 9.2 (L) 02/15/2024   HCT 28.7 (L) 02/15/2024   MCV 78.8 (L) 02/15/2024   PLT 215 02/15/2024      Latest Ref Rng & Units 02/09/2024    9:36 AM  CMP  Glucose 70 - 99 mg/dL 97   BUN 6 - 20 mg/dL 8   Creatinine 9.55 - 8.99 mg/dL 9.30   Sodium 864 - 854 mmol/L 138   Potassium 3.5 - 5.1 mmol/L 3.6   Chloride 98 - 111 mmol/L 104   CO2 22 - 32 mmol/L 22   Calcium  8.9 - 10.3 mg/dL 9.1    Edinburgh Score:    02/16/2024   10:42 AM  Edinburgh Postnatal Depression Scale Screening Tool  I have been able to laugh and see the funny side of things. 0  I have looked forward with enjoyment to things. 0  I have blamed myself unnecessarily  when things went wrong. 0  I have been anxious or worried for no good reason. 0  I have felt scared or panicky for no good reason. 0  Things have been getting on top of me. 0  I have been so unhappy that I have had difficulty sleeping. 0  I have felt sad or miserable. 0  I have been so unhappy that I have been crying. 0  The thought of harming myself has occurred to me. 0  Edinburgh Postnatal Depression Scale Total 0      After visit meds:  Allergies as of 02/16/2024   No Known Allergies      Medication List     STOP taking these medications    aspirin  EC 81 MG tablet       TAKE  these medications    albuterol  108 (90 Base) MCG/ACT inhaler Commonly known as: VENTOLIN  HFA Inhale 1-2 puffs into the lungs every 6 (six) hours as needed for wheezing or shortness of breath.   famotidine  20 MG tablet Commonly known as: PEPCID  Take 20 mg by mouth 2 (two) times daily.   ferrous sulfate  325 (65 FE) MG tablet Take 1 tablet (325 mg total) by mouth every other day.   fluticasone  110 MCG/ACT inhaler Commonly known as: FLOVENT  HFA Inhale 1-2 puffs into the lungs 2 (two) times daily as needed (respiratory issues.).   hydrOXYzine  25 MG tablet Commonly known as: ATARAX  Take 1-2 tablets (25-50 mg total) by mouth at bedtime as needed.   lidocaine  5 % Commonly known as: LIDODERM  Place 1 patch onto the skin daily. Remove & Discard patch within 12 hours or as directed by MD   ondansetron  4 MG disintegrating tablet Commonly known as: ZOFRAN -ODT Take 1 tablet (4 mg total) by mouth every 8 (eight) hours as needed for nausea or vomiting.   oxyCODONE  5 MG immediate release tablet Commonly known as: Oxy IR/ROXICODONE  Take 1 tablet (5 mg total) by mouth every 6 (six) hours as needed for severe pain (pain score 7-10).   PRENATAL PO Take 1 tablet by mouth in the morning.   valACYclovir  1000 MG tablet Commonly known as: VALTREX  Take 1 tablet (1,000 mg total) by mouth daily. What changed:  when to take this reasons to take this         Discharge home in stable condition Infant Feeding: EBM Infant Disposition:home with mother Discharge instruction: per After Visit Summary and Postpartum booklet. Activity: Advance as tolerated. Pelvic rest for 6 weeks.  Diet: routine diet Anticipated Birth Control: BTL done PP Postpartum Appointment:incision check in one week, office visit in 6 weeks Additional Postpartum F/U: N/A Future Appointments: Future Appointments  Date Time Provider Department Center  02/23/2024  1:15 PM Sebastian Sham, CNM [facility] None  02/28/2024 11:15 AM  Starla Harland BROCKS, MD [facility] None   Follow up Visit:  Follow-up Information     Starla Harland BROCKS, MD Follow up in 1 week(s).   Specialty: Obstetrics and Gynecology Why: 1 and 6 wk Contact information: [address] [place] [address] [phone]                     02/16/2024 Ann Deleon, CNM   "

## 2024-02-14 NOTE — Lactation Note (Signed)
 This note was copied from a baby's chart. Lactation Consultation Note  Patient Name: Ann Deleon Today's Date: 02/14/2024 Age:26 hours Reason for consult: Breastfeeding assistance   Maternal Data Has patient been taught Hand Expression?: Yes Does the patient have breastfeeding experience prior to this delivery?: No  Lactation to room to assist patient w/ a feeding at the breast.    Feeding Mother's Current Feeding Choice: Breast Milk  Several attempts were made w/ the nipple shield and w/o nipple shield to feed at the breast.  Infant does latch on to the breast but infant is slow to suckle.  (Infant was deep suctioned prior to the feeding.)   LATCH Score Latch: Repeated attempts needed to sustain latch, nipple held in mouth throughout feeding, stimulation needed to elicit sucking reflex.  Audible Swallowing: None  Type of Nipple: Everted at rest and after stimulation (Short everted nipples; Flat at rest.)  Comfort (Breast/Nipple): Soft / non-tender  Hold (Positioning): Assistance needed to correctly position infant at breast and maintain latch.  LATCH Score: 6  Lactation Tools Discussed/Used Tools: Pump;Flanges;Nipple Shields Nipple shield size: 20 Flange Size: 18 Breast pump type: Double-Electric Breast Pump Pump Education: Setup, frequency, and cleaning Reason for Pumping: Infant not stimulating the breast. Pumped volume: 0 mL  Mom requested a DEBP to be set up.  Education was provided on how to use DEBP.  Mom is using a size 18mm which seem to be the better fit for mom.  No colostrum was expressed.   Interventions Interventions: Breast feeding basics reviewed;Assisted with latch;Skin to skin;DEBP;Education  LC educated mom on how to use the NS and to be patient w/ feeding at the breast during this time.  Parents verbalized understanding.   Discharge Discharge Education: Outpatient recommendation Pump: Personal;Hands [place] Koranda;Manual;DEBP (Spectra ; Manual Pump;  Mom Cozy; Baby Buddha)  Consult Status Consult Status: Follow-up Follow-up type: In-patient    Haruye Lainez S Maxten Shuler 02/14/2024, 5:36 PM

## 2024-02-14 NOTE — H&P (Addendum)
 [address] Ann Deleon is a 28 y.o. at 38 weeks presenting for .a repeat low transverse cesarean section. She wants permanent sterility in the form of tubal ligation/tubal removal depending on her anatomy. She is aware that there is a risk of failure. OB History     Gravida  2   Para  1   Term  1   Preterm  0   AB  0   Living  1      SAB  0   IAB  0   Ectopic  0   Multiple  0   Live Births  1          Past Medical History:  Diagnosis Date   Anxiety    Asthma    GERD (gastroesophageal reflux disease)    Heart murmur    as a baby   Pneumonia    2024   Past Surgical History:  Procedure Laterality Date   CESAREAN SECTION N/A 11/03/2021   Procedure: CESAREAN SECTION;  Surgeon: Connell Davies, MD;  Location: [facility] ORS;  Service: Obstetrics;  Laterality: N/A;   MANDIBLE FRACTURE SURGERY     MANDIBLE SURGERY     WISDOM TOOTH EXTRACTION     Family History: family history includes Alcohol abuse in her father; Anxiety disorder in her mother; Depression in her father, mother, and sister; Diabetes in her father; Fibroids in her mother; Hypertension in her father and mother; Nephrolithiasis in her father; Ovarian cysts in her mother. Social History:  reports that she has never smoked. She has never used smokeless tobacco. She reports that she does not currently use alcohol. She reports that she does not use drugs.     Maternal Diabetes: No Genetic Screening: Normal Fetal Ultrasounds or other Referrals:  normal Maternal Substance Abuse:  No Significant Maternal Medications:  None Significant Maternal Lab Results:  None Number of Prenatal Visits:greater than 3 verified prenatal visits   Review of Systems History   Blood pressure 128/87, pulse (!) 128, temperature 99 F (37.2 C), temperature source Oral, resp. rate 18, last menstrual period 05/12/2023. Exam Physical Exam  Well nourished, well hydrated White female, no apparent distress She is ambulating and conversing  normally. General:  alert   Breasts:  inspection negative, no nipple discharge or bleeding, no masses or nodularity palpable  Lungs: clear to auscultation bilaterally  Heart:  regular rate and rhythm, S1, S2 normal, no murmur, click, rub or gallop  Abdomen: soft, non-tender; bowel sounds normal; no masses,  no organomegaly   Prenatal labs: ABO, Rh: --/--/O POS (12/29 0542) Antibody: NEG (12/29 0542) Rubella: 3.21 (06/20 1531) RPR: NON REACTIVE (12/24 0936)  HBsAg: Negative (06/20 1531)  HIV: Non Reactive (10/08 0914)  GBS: Negative/-- (12/12 1634)   Assessment/Plan: Previous cesarean section and permanent sterility - She declines a TOLAC.  She understands the risks of surgery, including, but not to infection, bleeding, DVTs, damage to bowel, bladder, ureters. She wishes to proceed.     [name] 02/14/2024, 7:32 AM

## 2024-02-15 ENCOUNTER — Encounter: Payer: Self-pay | Admitting: Obstetrics & Gynecology

## 2024-02-15 LAB — CBC
HCT: 28.7 % — ABNORMAL LOW (ref 36.0–46.0)
Hemoglobin: 9.2 g/dL — ABNORMAL LOW (ref 12.0–15.0)
MCH: 25.3 pg — ABNORMAL LOW (ref 26.0–34.0)
MCHC: 32.1 g/dL (ref 30.0–36.0)
MCV: 78.8 fL — ABNORMAL LOW (ref 80.0–100.0)
Platelets: 215 K/uL (ref 150–400)
RBC: 3.64 MIL/uL — ABNORMAL LOW (ref 3.87–5.11)
RDW: 15.4 % (ref 11.5–15.5)
WBC: 9.2 K/uL (ref 4.0–10.5)
nRBC: 0 % (ref 0.0–0.2)

## 2024-02-15 MED ORDER — FERROUS SULFATE 325 (65 FE) MG PO TABS
325.0000 mg | ORAL_TABLET | Freq: Every day | ORAL | Status: DC
  Administered 2024-02-16: 325 mg via ORAL
  Filled 2024-02-15: qty 1

## 2024-02-15 NOTE — Anesthesia Postprocedure Evaluation (Signed)
"   Anesthesia Post Note  Patient: Ann Deleon  Procedure(s) Performed: CESAREAN DELIVERY  Patient location during evaluation: Mother Baby Anesthesia Type: Spinal Level of consciousness: oriented and awake and alert Pain management: pain level controlled Vital Signs Assessment: post-procedure vital signs reviewed and stable Respiratory status: spontaneous breathing and respiratory function stable Cardiovascular status: blood pressure returned to baseline and stable Postop Assessment: no headache, no backache, no apparent nausea or vomiting and able to ambulate Anesthetic complications: no   There were no known notable events for this encounter.   Last Vitals:  Vitals:   02/15/24 0500 02/15/24 0815  BP:  115/68  Pulse: 71   Resp:  19  Temp:  37.1 C  SpO2: 94%     Last Pain:  Vitals:   02/15/24 0815  TempSrc: Oral  PainSc:                  Tod Handing A      "

## 2024-02-15 NOTE — Progress Notes (Signed)
 Obstetric Postpartum/PostOperative Daily Progress Note Subjective:  28 y.o. [id number] post-operative day # 1 status post repeat cesarean delivery.  She is ambulating, is tolerating po, is voiding spontaneously.  Her pain is well controlled on PO pain medications. Her lochia is less than menses.   Medications SCHEDULED MEDICATIONS   acetaminophen   1,000 mg Oral Q6H   famotidine   20 mg Oral BID   ibuprofen   600 mg Oral Q6H   lidocaine   1 patch Transdermal Q24H   prenatal vitamin w/FE, FA  1 tablet Oral Q breakfast   scopolamine   1 patch Transdermal Once   senna-docusate  2 tablet Oral Daily   simethicone   80 mg Oral TID PC    MEDICATION INFUSIONS   naloxone  HCl (NARCAN ) 2 mg in dextrose  5 % 250 mL infusion      PRN MEDICATIONS  albuterol , coconut oil, witch hazel-glycerin  **AND** dibucaine, diphenhydrAMINE  **OR** diphenhydrAMINE , diphenhydrAMINE , fluticasone  furoate-vilanterol, menthol , naloxone  **AND** sodium chloride  flush, naloxone  HCl (NARCAN ) 2 mg in dextrose  5 % 250 mL infusion, ondansetron  (ZOFRAN ) IV, oxyCODONE , oxyCODONE , simethicone , zolpidem     Objective:   Vitals:   02/15/24 0100 02/15/24 0300 02/15/24 0500 02/15/24 0815  BP:    115/68  Pulse: 73 80 71   Resp:    19  Temp:    98.8 F (37.1 C)  TempSrc:    Oral  SpO2: 95% 95% 94%   Weight:      Height:        Current Vital Signs 24h Vital Sign Ranges  T 98.8 F (37.1 C) Temp  Avg: 97.9 F (36.6 C)  Min: 97.6 F (36.4 C)  Max: 98.8 F (37.1 C)  BP 115/68 BP  Min: 109/60  Max: 123/96  HR 71 Pulse  Avg: 75.1  Min: 64  Max: 94  RR 19 Resp  Avg: 17.1  Min: 13  Max: 24  SaO2 94 % Room Air SpO2  Avg: 94.8 %  Min: 88 %  Max: 100 %       24 Hour I/O Current Shift I/O  Time Ins Outs 12/29 0701 - 12/30 0700 In: 1150 [I.V.:1000] Out: 1375 [Urine:690] No intake/output data recorded.  General: NAD Pulmonary: no increased work of breathing Abdomen: non-distended, non-tender, fundus firm at level of umbilicus Inc:  Clean/dry/intact Extremities: no edema, no erythema, no tenderness  Labs:  Recent Labs  Lab 02/09/24 0936 02/14/24 0542 02/15/24 0752  WBC 10.3 12.6* 9.2  HGB 11.1* 10.8* 9.2*  HCT 34.3* 33.2* 28.7*  PLT 285 289 215     Assessment:   28 y.o. G2P2002 postoperative day # 1 status post repeat cesarean section  Plan:  1) Acute blood loss anemia - hemodynamically stable and asymptomatic - po ferrous sulfate   2) O POS / Rubella 3.21 (06/20 1531)/ Varicella Immune  3) TDAP status given prenatally  4) breast /Contraception = bilateral tubal ligation  5) Disposition - Plan to discharge home tomorrow  Damien PARSLEY, CNM

## 2024-02-16 MED ORDER — OXYCODONE HCL 5 MG PO TABS: 5.0000 mg | ORAL_TABLET | Freq: Four times a day (QID) | ORAL | 0 refills | Status: DC | PRN

## 2024-02-16 MED ORDER — LIDOCAINE 5 % EX PTCH: 1.0000 | MEDICATED_PATCH | CUTANEOUS | 0 refills | Status: DC

## 2024-02-16 MED ORDER — FERROUS SULFATE 325 (65 FE) MG PO TABS: 325.0000 mg | ORAL_TABLET | ORAL | 0 refills | Status: AC

## 2024-02-16 NOTE — Final Progress Note (Signed)
 Subjective: Postpartum Day 2: Cesarean Delivery [facility] is feeling well overall. She is ambulating, voiding, and tolerating POs without difficulty. She is passing flatus. Her pain is well-controlled and her bleeding is WNL. Her mood is stable. She is pumping breast milk. Objective: Vital signs in last 24 hours: Temp:  [97.7 F (36.5 C)-98.3 F (36.8 C)] 98.3 F (36.8 C) (12/31 0846) Pulse Rate:  [70-71] 70 (12/31 0846) Resp:  [18] 18 (12/31 0846) BP: (119-129)/(77-87) 126/77 (12/31 0846) SpO2:  [100 %] 100 % (12/31 0846)  Physical Exam:  General: alert, cooperative, and appears stated age Lochia: appropriate Uterine Fundus: firm Incision: healing well, no significant drainage, dressing C/D/I DVT Evaluation: No evidence of DVT seen on physical exam.  Recent Labs    02/14/24 0542 02/15/24 0752  HGB 10.8* 9.2*  HCT 33.2* 28.7*    Assessment/Plan: Status post Cesarean section/BTL. Doing well postoperatively.  Discharge home D/c instructions reviewed PO iron Incision check in one week Office visit in 6 weeks  [name], CNM 02/16/2024, 10:51 AM

## 2024-02-16 NOTE — Lactation Note (Signed)
 This note was copied from a baby's chart. Lactation Consultation Note  Patient Name: Ann Deleon Date: 02/16/2024 Age:26 hours Reason for consult: Follow-up assessment;Other (Comment) (Discharge Education)   Maternal Data To room for a follow up assessment and discharge education.  Mom stated that infant has not been latching and she has stopped because she doesn't want to stress herself out with it.  Instead she has decided to primarily pump and provide milk in a bottle.  Feeding Mother's Current Feeding Choice: Breast Milk and Donor Milk  Interventions Interventions: Breast feeding basics reviewed;Education;CDC milk storage guidelines  Discharge Discharge Education: Engorgement and breast care;Warning signs for feeding baby;Outpatient recommendation  Education on engorgement prevention/treatment was discussed as well as breastmilk storage guidelines.  LC provided patient with a handout on breastmilk storage guidelines from [facility]. [facility] outpatient lactation services phone number written on the white board in the room.  Patient verbalized understanding.  LC also provided education from the postpartum book about warning signs to look for in a poor feeding.    Consult Status Consult Status: Complete Follow-up type: Call as needed    Ricky RAMAN Cannen Dupras 02/16/2024, 10:11 AM

## 2024-02-22 ENCOUNTER — Encounter: Admitting: Obstetrics & Gynecology

## 2024-02-22 NOTE — Progress Notes (Unsigned)
" ° ° °  GYNECOLOGY PROGRESS NOTE  Subjective:    Patient ID: [name], female    DOB: 01/03/1996, 29 y.o.   MRN: [id number]  HPI  Patient is a 29 y.o. G82P2002 female who presents for   {Common ambulatory SmartLinks:19316}  Review of Systems {ros; complete:30496}   Objective:   unknown if currently breastfeeding. There is no height or weight on file to calculate BMI. General appearance: {general exam:16600} Abdomen: {abdominal exam:16834} Pelvic: {pelvic exam:16852::cervix normal in appearance,external genitalia normal,no adnexal masses or tenderness,no cervical motion tenderness,rectovaginal septum normal,uterus normal size, shape, and consistency,vagina normal without discharge} Extremities: {extremity exam:5109} Neurologic: {neuro exam:17854}   Assessment:   No diagnosis found.   Plan:   There are no diagnoses linked to this encounter.   "

## 2024-02-23 ENCOUNTER — Encounter: Payer: Self-pay | Admitting: Certified Nurse Midwife

## 2024-02-23 ENCOUNTER — Ambulatory Visit: Admitting: Certified Nurse Midwife

## 2024-02-23 VITALS — BP 127/81 | HR 77 | Wt 247.1 lb

## 2024-02-23 DIAGNOSIS — Z5189 Encounter for other specified aftercare: Secondary | ICD-10-CM

## 2024-02-27 ENCOUNTER — Encounter: Payer: Self-pay | Admitting: Obstetrics & Gynecology

## 2024-02-28 ENCOUNTER — Telehealth: Admitting: Obstetrics & Gynecology

## 2024-02-28 ENCOUNTER — Encounter: Payer: Self-pay | Admitting: Obstetrics & Gynecology

## 2024-02-28 DIAGNOSIS — Z9889 Other specified postprocedural states: Secondary | ICD-10-CM

## 2024-02-28 NOTE — Progress Notes (Signed)
" °  I connected with on at  EDT by telephone at home and verified that I am speaking with the correct person using two identifiers.   I discussed the limitations, risks, security and privacy concerns of performing an evaluation and management service by telephone and the availability of in person appointments. I also discussed with the patient that there may be a patient responsible charge related to this service. The patient expressed understanding and agreed to proceed.      Past Medical History:  Diagnosis Date   Lordosis    Seizures (HCC)    Last seizure 2017   Syphilis    Many years ago, treated       I discussed the assessment and treatment plan with the patient. The patient was provided an opportunity to ask questions and all were answered. The patient agreed with the plan and demonstrated an understanding of the instructions.   The patient was advised to call back or seek an in-person evaluation/go to the ED if the symptoms worsen or if the condition fails to improve as anticipated.  I provided 10 minutes of non-face-to-face time during this encounter.   Ann JAYSON Birkenhead, MD Center for [facility], [facility]  Ann Deleon is a 29 y.o. female who presents for a postpartum visit. She is 6 weeks postpartum following a PLTCS I have fully reviewed the prenatal and intrapartum course. The delivery was at 39gestational weeks. Postpartum course has been normal. Baby's course has been normal. Baby is feeding by bottle.  Bleeding Bowel function is  Bladder function is normal. Patient  sexually active. Contraception method is abstinence at this time.  Postpartum depression screening: negative    Objective:    There were no vitals taken for this visit.      Assessment:    Normal postpartum exam.  Plan:   She will follow up at her 6 weeks visit/sooner as necessary. "

## 2024-03-01 ENCOUNTER — Encounter: Payer: Self-pay | Admitting: Certified Nurse Midwife

## 2024-03-03 ENCOUNTER — Encounter: Payer: Self-pay | Admitting: Advanced Practice Midwife

## 2024-03-03 ENCOUNTER — Ambulatory Visit: Admitting: Advanced Practice Midwife

## 2024-03-03 ENCOUNTER — Ambulatory Visit: Admitting: Professional Counselor

## 2024-03-03 VITALS — BP 113/65 | HR 71 | Ht 68.0 in

## 2024-03-03 DIAGNOSIS — F439 Reaction to severe stress, unspecified: Secondary | ICD-10-CM

## 2024-03-03 DIAGNOSIS — F411 Generalized anxiety disorder: Secondary | ICD-10-CM | POA: Diagnosis not present

## 2024-03-03 DIAGNOSIS — Z5189 Encounter for other specified aftercare: Secondary | ICD-10-CM

## 2024-03-03 NOTE — Progress Notes (Unsigned)
 " THERAPIST PROGRESS NOTE  Virtual Visit via Video Note  I connected with [name] on 03/03/24 at 11:00 AM EST by a video enabled telemedicine application and verified that I am speaking with the correct person using two identifiers.  Location: Patient: Home Provider: Remote office   I discussed the limitations of evaluation and management by telemedicine and the availability of in person appointments. The patient expressed understanding and agreed to proceed.   I discussed the assessment and treatment plan with the patient. The patient was provided an opportunity to ask questions and all were answered. The patient agreed with the plan and demonstrated an understanding of the instructions.   The patient was advised to call back or seek an in-person evaluation if the symptoms worsen or if the condition fails to improve as anticipated.  I provided 42 minutes of non-face-to-face time during this encounter. [name], [facility]  Session Time: 11:02 AM - 11:44  Participation Level: Active  Behavioral Response: Casual, Alert, Dysphoric  Type of Therapy: Individual Therapy  Treatment Goals addressed: Active Anxiety  LTG: Maybe more trust. Maybe stop over-thinking. (Not Progressing)                Start:  06/03/23    Expected End:  06/01/24    Goal Note Reviewed 09/28/23 That ain't got nowhere.    STG: I think it's letting (daughter) go places without me or my husband and knowing she will be fine. To reduce sxs of anxiety AEB using grounding techniques and restructuring maladaptive patterns of thinking over the next 90 days. (Not Progressing)    Goal Note Reviewed 09/28/23 No where. I think just not being able to control what she's doing.   STG: I think I look too far into situations. To improve mindfulness AEB utilizing mindfulness exercises and focusing on what's within her control over the next 90 days.  (Progressing)    Goal Note Reviewed 09/28/23 - Not sure. I  try to focus on the moment but then sometimes it just gets away.   ProgressTowards Goals: Progressing  Interventions: Motivational Interviewing and Supportive  Summary: TANGY DROZDOWSKI is a 29 y.o. female who presents with a history of anxiety and trauma. She appeared somber but oriented x5. She had her baby back in December. Aloma discussed how the family has handled the new addition. She was tearful discussing how her older daughter seems to be attached to her husband now. Pelagia explored ways to get one-on-one time with her older daughter. She also discussed how both extended families have dealt with the holidays and new baby. Alberta was again tearful discussing some of the actions of her mother. She was receptive to honoring her emotions and maintaining her boundaries.  Therapist Response: Conducted session with Harrison. Began session with check-in/update since previous session. Utilized empathetic and reflective listening. Used open-ended questions to facilitate discussion and summarized Lajuanna's thoughts/feelings. Normalized feelings during transition and with disappointment around other's behaviors. Reinforced maintaining boundaries for healthy relationships. Scheduled additional appointment and concluded session.   Suicidal/Homicidal: No  Plan: Return again in 3 weeks.  Diagnosis: Trauma and stressor-related disorder  GAD (generalized anxiety disorder)  Collaboration of Care: Medication Management AEB chart review  Patient/Guardian was advised Release of Information must be obtained prior to any record release in order to collaborate their care with an outside provider. Patient/Guardian was advised if they have not already done so to contact the registration department to sign all necessary forms in order for  us  to release information regarding their care.   Consent: Patient/Guardian gives verbal consent for treatment and assignment of benefits for services provided during this  visit. Patient/Guardian expressed understanding and agreed to proceed.   [name], [facility] 03/03/2024  "

## 2024-03-03 NOTE — Progress Notes (Signed)
"  ° °  Postoperative Cesarean Incision Check Ann Deleon is a 29 y.o. [id number] s/p LTCS (w/BTL)on 02/14/24 at [redacted]w[redacted]d for repeat cesarean, POD#18, here today for incision check. She has noticed an odor in the incision area after showering. She also mentions some itching.  Subjective: The patient is not having any pain. She denies fever, chills, nausea, and vomiting. Eating a regular diet without difficulty.  Is having regular bowel movements. Activity: normal activities of daily living. Bleeding is spotting. She denies pain, redness, drainage of her incision. She has a good support system and is getting enough sleep.   Objective: BP 113/65   Pulse 71   Ht 5' 8 (1.727 m)   Breastfeeding Yes   BMI 37.57 kg/m  Body mass index is 37.57 kg/m.  General:  alert and no distress  Abdomen: soft, bowel sounds active, non-tender  Incision:   no drainage, no erythema, no hernia, no seroma, no swelling, 2 small areas- left and middle where wound is still healing- superficial, no dehiscence. Skin below incision appears red/irritated likely due to panis not allowing air flow. Does not appear to be fungal.     Assessment/Plan: Ann Deleon is a 29 y.o. [phone] s/p repeat LTCS (w/BTL) at [redacted]w[redacted]d, POD#18, here today for incision check, wound is nearly healed, skin irritation below wound -Try to keep area clean and dry- use cornstarch to skin below wound. Apply small amount of A&D ointment to incision -Discussed at-home care, healing expectations, si/sx of infection.  -Call clinic if developing redness, discharge, or increasing pain. -Avoid vigorous scrubbing/washing of incision site; hygiene reviewed. -May resume driving and light walking. Still no heavy lifting >10-12 lbs and pelvic rest advised until cleared at 6wk postpartum visit.  -If stooling regularly x 1-2 weeks, can take stool softener every other day x 1 week, taper as tolerated.  Return for 6 week postpartum visit.   Slater Rains,  CNM [facility] 03/03/2024 9:18 AM  "

## 2024-03-20 ENCOUNTER — Ambulatory Visit: Admitting: Professional Counselor

## 2024-03-20 DIAGNOSIS — F439 Reaction to severe stress, unspecified: Secondary | ICD-10-CM

## 2024-03-20 DIAGNOSIS — F411 Generalized anxiety disorder: Secondary | ICD-10-CM

## 2024-03-27 ENCOUNTER — Ambulatory Visit: Admitting: Obstetrics & Gynecology

## 2024-04-06 ENCOUNTER — Ambulatory Visit: Admitting: Professional Counselor
